# Patient Record
Sex: Female | Born: 1960 | Race: White | Hispanic: No | Marital: Married | Smoking: Current every day smoker
Health system: Southern US, Community
[De-identification: ages and names within clinical notes are randomized; demographics above are authoritative.]

## PROBLEM LIST (undated history)

## (undated) DIAGNOSIS — E119 Type 2 diabetes mellitus without complications: Secondary | ICD-10-CM

## (undated) DIAGNOSIS — J449 Chronic obstructive pulmonary disease, unspecified: Secondary | ICD-10-CM

## (undated) DIAGNOSIS — I639 Cerebral infarction, unspecified: Secondary | ICD-10-CM

## (undated) DIAGNOSIS — R51 Headache: Secondary | ICD-10-CM

## (undated) DIAGNOSIS — F17200 Nicotine dependence, unspecified, uncomplicated: Secondary | ICD-10-CM

## (undated) DIAGNOSIS — F419 Anxiety disorder, unspecified: Secondary | ICD-10-CM

## (undated) DIAGNOSIS — E7800 Pure hypercholesterolemia, unspecified: Secondary | ICD-10-CM

## (undated) DIAGNOSIS — I10 Essential (primary) hypertension: Secondary | ICD-10-CM

## (undated) DIAGNOSIS — F32A Depression, unspecified: Secondary | ICD-10-CM

## (undated) HISTORY — PX: BREAST LUMPECTOMY: SHX2

---

## 2003-03-21 ENCOUNTER — Emergency Department (HOSPITAL_COMMUNITY): Admission: EM | Admit: 2003-03-21 | Discharge: 2003-03-22 | Payer: Self-pay | Admitting: Emergency Medicine

## 2009-11-16 ENCOUNTER — Emergency Department (HOSPITAL_COMMUNITY): Admission: EM | Admit: 2009-11-16 | Discharge: 2009-11-16 | Payer: Self-pay | Admitting: Emergency Medicine

## 2012-05-04 ENCOUNTER — Encounter (HOSPITAL_COMMUNITY): Payer: Self-pay | Admitting: *Deleted

## 2012-05-04 ENCOUNTER — Emergency Department (HOSPITAL_COMMUNITY)
Admission: EM | Admit: 2012-05-04 | Discharge: 2012-05-04 | Disposition: A | Discharge status: Home / Self Care | Payer: Worker's Compensation | Attending: Emergency Medicine | Admitting: Emergency Medicine

## 2012-05-04 DIAGNOSIS — I10 Essential (primary) hypertension: Secondary | ICD-10-CM | POA: Insufficient documentation

## 2012-05-04 DIAGNOSIS — Z79899 Other long term (current) drug therapy: Secondary | ICD-10-CM | POA: Insufficient documentation

## 2012-05-04 DIAGNOSIS — S61209A Unspecified open wound of unspecified finger without damage to nail, initial encounter: Secondary | ICD-10-CM | POA: Insufficient documentation

## 2012-05-04 DIAGNOSIS — E119 Type 2 diabetes mellitus without complications: Secondary | ICD-10-CM | POA: Insufficient documentation

## 2012-05-04 DIAGNOSIS — Y9289 Other specified places as the place of occurrence of the external cause: Secondary | ICD-10-CM | POA: Insufficient documentation

## 2012-05-04 DIAGNOSIS — Y990 Civilian activity done for income or pay: Secondary | ICD-10-CM | POA: Insufficient documentation

## 2012-05-04 DIAGNOSIS — Y9389 Activity, other specified: Secondary | ICD-10-CM | POA: Insufficient documentation

## 2012-05-04 DIAGNOSIS — F17200 Nicotine dependence, unspecified, uncomplicated: Secondary | ICD-10-CM | POA: Insufficient documentation

## 2012-05-04 DIAGNOSIS — S61219A Laceration without foreign body of unspecified finger without damage to nail, initial encounter: Secondary | ICD-10-CM

## 2012-05-04 DIAGNOSIS — W292XXA Contact with other powered household machinery, initial encounter: Secondary | ICD-10-CM | POA: Insufficient documentation

## 2012-05-04 HISTORY — DX: Essential (primary) hypertension: I10

## 2012-05-04 MED ORDER — CEPHALEXIN 500 MG PO CAPS: 500.0000 mg | ORAL_CAPSULE | Freq: Four times a day (QID) | ORAL | Status: DC

## 2012-05-04 MED ORDER — HYDROCODONE-ACETAMINOPHEN 5-325 MG PO TABS: 1.0000 | ORAL_TABLET | ORAL | Status: DC | PRN

## 2012-05-04 MED ORDER — TETANUS-DIPHTH-ACELL PERTUSSIS 5-2.5-18.5 LF-MCG/0.5 IM SUSP
0.5000 mL | Freq: Once | INTRAMUSCULAR | Status: AC
  Administered 2012-05-04: 0.5 mL via INTRAMUSCULAR
  Filled 2012-05-04: qty 0.5

## 2012-05-04 MED ORDER — LIDOCAINE HCL (PF) 2 % IJ SOLN
2.0000 mL | Freq: Once | INTRAMUSCULAR | Status: DC
  Filled 2012-05-04: qty 10

## 2012-05-04 MED ORDER — CEPHALEXIN 500 MG PO CAPS
500.0000 mg | ORAL_CAPSULE | Freq: Once | ORAL | Status: AC
  Administered 2012-05-04: 500 mg via ORAL
  Filled 2012-05-04: qty 1

## 2012-05-04 MED ORDER — HYDROCODONE-ACETAMINOPHEN 5-325 MG PO TABS
1.0000 | ORAL_TABLET | Freq: Once | ORAL | Status: AC
  Administered 2012-05-04: 1 via ORAL
  Filled 2012-05-04: qty 1

## 2012-05-04 NOTE — ED Notes (Signed)
drsg applied to finger. nad noted prior to dc. Dc instructions reviewed and 2 scripts given. Ambulated out without difficulty.

## 2012-05-04 NOTE — ED Notes (Signed)
Pt sliced left pointer finger on meat slicer. Bleeding controlled.

## 2012-05-04 NOTE — ED Provider Notes (Signed)
History     CSN: [id number]  Arrival date & time 05/04/12  1842   First MD Initiated Contact with Patient 05/04/12 1858      Chief Complaint  Patient presents with  . Laceration    (Consider location/radiation/quality/duration/timing/severity/associated sxs/prior treatment) Patient is a 52 y.o. female presenting with skin laceration. The history is provided by the patient and the spouse.  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 Pain details:    Quality:  Throbbing   Severity:  Moderate   Progression:  Unchanged Relieved by:  Certain positions Worsened by:  Pressure Tetanus status:  Unknown   Past Medical History  Diagnosis Date  . Diabetes mellitus without complication   . Hypertension     History reviewed. No pertinent past surgical history.  History reviewed. No pertinent family history.  History  Substance Use Topics  . Smoking status: Current Every Day Smoker  . Smokeless tobacco: Not on file  . Alcohol Use: Yes     Comment: occasionally    OB History   Grav Para Term Preterm Abortions TAB SAB Ect Mult Living                  Review of Systems  Constitutional: Negative for fever and chills.  HENT: Negative.   Respiratory: Negative.   Cardiovascular: Negative.   Gastrointestinal: Negative for nausea.  Skin: Positive for wound.  Neurological: Negative for numbness.    Allergies  Review of patient's allergies indicates no known allergies.  Home Medications   Current Outpatient Rx  Name  Route  Sig  Dispense  Refill  . lisinopril-hydrochlorothiazide (PRINZIDE,ZESTORETIC) 20-12.5 MG per tablet   Oral   Take 1 tablet by mouth daily.         . metFORMIN (GLUCOPHAGE) 1000 MG tablet   Oral   Take 2,000 mg by mouth 2 (two) times daily with a meal.         . cephALEXin  (KEFLEX) 500 MG capsule   Oral   Take 1 capsule (500 mg total) by mouth 4 (four) times daily.   28 capsule   0   . HYDROcodone-acetaminophen (NORCO/VICODIN) 5-325 MG per tablet   Oral   Take 1 tablet by mouth every 4 (four) hours as needed for pain.   15 tablet   0     BP 145/80  Pulse 101  Temp(Src) 98.4 F (36.9 C) (Oral)  Resp 20  Ht 5\' 5"  (1.651 m)  Wt 246 lb (111.585 kg)  BMI 40.94 kg/m2  SpO2 100%  Physical Exam  Constitutional: She is oriented to person, place, and time. She appears well-developed and well-nourished.  HENT:  Head: Normocephalic.  Cardiovascular: Normal rate.   Pulmonary/Chest: Effort normal.  Musculoskeletal: She exhibits no tenderness.  Neurological: She is alert and oriented to person, place, and time. No sensory deficit.  Skin: Laceration noted.  Flap laceration left index finger tuft,  Hemostatic.    ED Course  Procedures (including critical care time)   LACERATION REPAIR Performed by: Burgess Amor Authorized by: Burgess Amor Consent: Verbal consent obtained. Risks and benefits: risks, benefits and alternatives were discussed Consent given by: patient Patient identity confirmed: provided demographic data Prepped and Draped in normal sterile fashion Wound explored  Laceration Location: left index finger  Laceration Length: 2 cm  No Foreign  Bodies seen or palpated  Anesthesia: digital block Local anesthetic: lidocaine 2% without epinephrine  Anesthetic total: 4 ml  Irrigation method: syringe Amount of cleaning: copious with saline and syringe  Skin closure: ethilon 4-0  Number of sutures: 5  Technique: simple interrupted   Patient tolerance: Patient tolerated the procedure well with no immediate complications.   Labs Reviewed - No data to display No results found.   1. Laceration of finger of left hand, initial encounter       MDM  Simple suture laceration  But borderline old injury in a diabetic patient.  Her  delay in presentation was due to waiting at another ed for 4 hours before coming here.  She was given keflex prescription.  Tetanus updated as well.  Few hydrocodone.  Encouraged ice,  Elevation once numbness wears off.  Recheck for any sign of infection.  Suture removal in 10 days.  Dressing applied.        Burgess Amor, PA-C 05/04/12 2026

## 2012-05-07 NOTE — ED Provider Notes (Signed)
Medical screening examination/treatment/procedure(s) were performed by non-physician practitioner and as supervising physician I was immediately available for consultation/collaboration.  Jacy Howat J. Katharyn Schauer, MD 05/07/12 1205 

## 2013-02-14 ENCOUNTER — Emergency Department (HOSPITAL_COMMUNITY)
Admission: EM | Admit: 2013-02-14 | Discharge: 2013-02-14 | Disposition: A | Discharge status: Home / Self Care | Payer: Self-pay | Attending: Emergency Medicine | Admitting: Emergency Medicine

## 2013-02-14 ENCOUNTER — Encounter (HOSPITAL_COMMUNITY): Payer: Self-pay | Admitting: Emergency Medicine

## 2013-02-14 DIAGNOSIS — I10 Essential (primary) hypertension: Secondary | ICD-10-CM | POA: Insufficient documentation

## 2013-02-14 DIAGNOSIS — J029 Acute pharyngitis, unspecified: Secondary | ICD-10-CM | POA: Insufficient documentation

## 2013-02-14 DIAGNOSIS — F17200 Nicotine dependence, unspecified, uncomplicated: Secondary | ICD-10-CM | POA: Insufficient documentation

## 2013-02-14 DIAGNOSIS — Z79899 Other long term (current) drug therapy: Secondary | ICD-10-CM | POA: Insufficient documentation

## 2013-02-14 DIAGNOSIS — E119 Type 2 diabetes mellitus without complications: Secondary | ICD-10-CM | POA: Insufficient documentation

## 2013-02-14 DIAGNOSIS — R6889 Other general symptoms and signs: Secondary | ICD-10-CM | POA: Insufficient documentation

## 2013-02-14 DIAGNOSIS — Z792 Long term (current) use of antibiotics: Secondary | ICD-10-CM | POA: Insufficient documentation

## 2013-02-14 DIAGNOSIS — J329 Chronic sinusitis, unspecified: Secondary | ICD-10-CM | POA: Insufficient documentation

## 2013-02-14 MED ORDER — AMOXICILLIN-POT CLAVULANATE 875-125 MG PO TABS: 1.0000 | ORAL_TABLET | Freq: Two times a day (BID) | ORAL | Status: DC

## 2013-02-14 NOTE — ED Notes (Signed)
Sinus congestion,  sorethroat and cough since Monday

## 2013-02-14 NOTE — Discharge Instructions (Signed)

## 2013-02-16 NOTE — ED Provider Notes (Signed)
CSN: [id number]     Arrival date & time 02/14/13  1024 History   First MD Initiated Contact with Patient 02/14/13 1141     Chief Complaint  Patient presents with  . Nasal Congestion     (Consider location/radiation/quality/duration/timing/severity/associated sxs/prior Treatment) Patient is a 53 y.o. female presenting with URI. The history is provided by the patient.  URI Presenting symptoms: congestion, facial pain and sore throat   Presenting symptoms: no cough, no ear pain, no fever and no rhinorrhea   Severity:  Moderate Onset quality:  Gradual Duration:  1 week Timing:  Constant Progression:  Worsening Chronicity:  New Relieved by:  Nothing Worsened by:  Nothing tried Ineffective treatments:  OTC medications Associated symptoms: sinus pain and sneezing   Associated symptoms: no arthralgias, no headaches, no myalgias, no neck pain, no swollen glands and no wheezing   Associated symptoms comment:  Nasal congestion Risk factors: diabetes mellitus   Risk factors: no immunosuppression and no sick contacts     Past Medical History  Diagnosis Date  . Diabetes mellitus without complication   . Hypertension    History reviewed. No pertinent past surgical history. History reviewed. No pertinent family history. History  Substance Use Topics  . Smoking status: Current Every Day Smoker  . Smokeless tobacco: Not on file  . Alcohol Use: Yes     Comment: occasionally   OB History   Grav Para Term Preterm Abortions TAB SAB Ect Mult Living                 Review of Systems  Constitutional: Negative for fever, chills, activity change and appetite change.  HENT: Positive for congestion, sinus pressure, sneezing and sore throat. Negative for ear pain, facial swelling, rhinorrhea and trouble swallowing.   Eyes: Negative for visual disturbance.  Respiratory: Negative for cough, chest tightness, shortness of breath, wheezing and stridor.   Gastrointestinal: Negative for nausea,  vomiting and abdominal pain.  Musculoskeletal: Negative for arthralgias, myalgias, neck pain and neck stiffness.  Skin: Negative.   Neurological: Negative for dizziness, weakness, numbness and headaches.  Hematological: Negative for adenopathy.  Psychiatric/Behavioral: Negative for confusion.  All other systems reviewed and are negative.      Allergies  Review of patient's allergies indicates no known allergies.  Home Medications   Current Outpatient Rx  Name  Route  Sig  Dispense  Refill  . guaiFENesin (ROBITUSSIN) 100 MG/5ML SOLN   Oral   Take 5 mLs by mouth every 4 (four) hours as needed for cough or to loosen phlegm.         Marland Kitchen lisinopril-hydrochlorothiazide (PRINZIDE,ZESTORETIC) 20-12.5 MG per tablet   Oral   Take 1 tablet by mouth daily.         . metFORMIN (GLUCOPHAGE) 1000 MG tablet   Oral   Take 2,000 mg by mouth 2 (two) times daily with a meal.         . amoxicillin-clavulanate (AUGMENTIN) 875-125 MG per tablet   Oral   Take 1 tablet by mouth 2 (two) times daily. For 10 days   20 tablet   0    BP 187/104  Pulse 65  Temp(Src) 98.4 F (36.9 C) (Oral)  Resp 20  SpO2 99% Physical Exam  Nursing note and vitals reviewed. Constitutional: She is oriented to person, place, and time. She appears well-developed and well-nourished. No distress.  HENT:  Head: Normocephalic and atraumatic.  Right Ear: Tympanic membrane and ear canal normal.  Left Ear: Tympanic membrane  and ear canal normal.  Nose: Mucosal edema and rhinorrhea present. Right sinus exhibits maxillary sinus tenderness. Left sinus exhibits maxillary sinus tenderness.  Mouth/Throat: Uvula is midline, oropharynx is clear and moist and mucous membranes are normal. No trismus in the jaw. No uvula swelling. No oropharyngeal exudate, posterior oropharyngeal edema, posterior oropharyngeal erythema or tonsillar abscesses.  Eyes: Conjunctivae are normal.  Neck: Normal range of motion and phonation normal.  Neck supple. No Brudzinski's sign and no Kernig's sign noted.  Cardiovascular: Normal rate, regular rhythm, normal heart sounds and intact distal pulses.   No murmur heard. Pulmonary/Chest: Effort normal and breath sounds normal. No respiratory distress. She has no wheezes. She has no rales.  Abdominal: Soft. She exhibits no distension. There is no tenderness. There is no rebound and no guarding.  Musculoskeletal: Normal range of motion. She exhibits no edema.  Lymphadenopathy:    She has no cervical adenopathy.  Neurological: She is alert and oriented to person, place, and time. She exhibits normal muscle tone. Coordination normal.  Skin: Skin is warm and dry.    ED Course  Procedures (including critical care time) Labs Review Labs Reviewed - No data to display Imaging Review No results found.  EKG Interpretation   None       MDM   Final diagnoses:  Sinusitis    Patient with one week hx of sinus pressure, nasal congestion, and facial pain.  VSS.  She is otherwise well appearing.  No meningeal signs.  She agrees to ibuprofen, fluids and zithromax.  She appears stable for d/c.      Brytani Voth L. Vanessa , PA-C 02/16/13 2059

## 2013-02-17 NOTE — ED Provider Notes (Signed)
Medical screening examination/treatment/procedure(s) were performed by non-physician practitioner and as supervising physician I was immediately available for consultation/collaboration.  EKG Interpretation   None         Alfonzo Feller, DO 02/17/13 1138

## 2013-07-07 ENCOUNTER — Emergency Department (HOSPITAL_COMMUNITY)
Admission: EM | Admit: 2013-07-07 | Discharge: 2013-07-07 | Disposition: A | Discharge status: Home / Self Care | Payer: 59 | Attending: Emergency Medicine | Admitting: Emergency Medicine

## 2013-07-07 ENCOUNTER — Emergency Department (HOSPITAL_COMMUNITY): Payer: 59

## 2013-07-07 ENCOUNTER — Encounter (HOSPITAL_COMMUNITY): Payer: Self-pay | Admitting: Emergency Medicine

## 2013-07-07 DIAGNOSIS — E119 Type 2 diabetes mellitus without complications: Secondary | ICD-10-CM | POA: Insufficient documentation

## 2013-07-07 DIAGNOSIS — F17200 Nicotine dependence, unspecified, uncomplicated: Secondary | ICD-10-CM | POA: Insufficient documentation

## 2013-07-07 DIAGNOSIS — Z79899 Other long term (current) drug therapy: Secondary | ICD-10-CM | POA: Insufficient documentation

## 2013-07-07 DIAGNOSIS — I10 Essential (primary) hypertension: Secondary | ICD-10-CM | POA: Insufficient documentation

## 2013-07-07 DIAGNOSIS — W010XXA Fall on same level from slipping, tripping and stumbling without subsequent striking against object, initial encounter: Secondary | ICD-10-CM | POA: Insufficient documentation

## 2013-07-07 DIAGNOSIS — Y990 Civilian activity done for income or pay: Secondary | ICD-10-CM | POA: Insufficient documentation

## 2013-07-07 DIAGNOSIS — S8002XA Contusion of left knee, initial encounter: Secondary | ICD-10-CM

## 2013-07-07 DIAGNOSIS — Y9289 Other specified places as the place of occurrence of the external cause: Secondary | ICD-10-CM | POA: Insufficient documentation

## 2013-07-07 DIAGNOSIS — S8000XA Contusion of unspecified knee, initial encounter: Secondary | ICD-10-CM | POA: Insufficient documentation

## 2013-07-07 DIAGNOSIS — Y9389 Activity, other specified: Secondary | ICD-10-CM | POA: Insufficient documentation

## 2013-07-07 MED ORDER — OXYCODONE-ACETAMINOPHEN 5-325 MG PO TABS: 1.0000 | ORAL_TABLET | ORAL | Status: DC | PRN

## 2013-07-07 NOTE — Discharge Instructions (Signed)
Contusion °A contusion is a deep bruise. Contusions are the result of an injury that caused bleeding under the skin. The contusion may turn blue, purple, or yellow. Minor injuries will give you a painless contusion, but more severe contusions may stay painful and swollen for a few weeks.  °CAUSES  °A contusion is usually caused by a blow, trauma, or direct force to an area of the body. °SYMPTOMS  °· Swelling and redness of the injured area. °· Bruising of the injured area. °· Tenderness and soreness of the injured area. °· Pain. °DIAGNOSIS  °The diagnosis can be made by taking a history and physical exam. An X-ray, CT scan, or MRI may be needed to determine if there were any associated injuries, such as fractures. °TREATMENT  °Specific treatment will depend on what area of the body was injured. In general, the best treatment for a contusion is resting, icing, elevating, and applying cold compresses to the injured area. Over-the-counter medicines may also be recommended for pain control. Ask your caregiver what the best treatment is for your contusion. °HOME CARE INSTRUCTIONS  °· Put ice on the injured area. °¨ Put ice in a plastic bag. °¨ Place a towel between your skin and the bag. °¨ Leave the ice on for 15-20 minutes, 3-4 times a day, or as directed by your health care provider. °· Only take over-the-counter or prescription medicines for pain, discomfort, or fever as directed by your caregiver. Your caregiver may recommend avoiding anti-inflammatory medicines (aspirin, ibuprofen, and naproxen) for 48 hours because these medicines may increase bruising. °· Rest the injured area. °· If possible, elevate the injured area to reduce swelling. °SEEK IMMEDIATE MEDICAL CARE IF:  °· You have increased bruising or swelling. °· You have pain that is getting worse. °· Your swelling or pain is not relieved with medicines. °MAKE SURE YOU:  °· Understand these instructions. °· Will watch your condition. °· Will get help right  away if you are not doing well or get worse. °Document Released: 10/04/2004 Document Revised: 12/30/2012 Document Reviewed: 10/30/2010 °[employer]® Patient Information ©2015 [employer]. This information is not intended to replace advice given to you by your health care provider. Make sure you discuss any questions you have with your health care provider. ° °

## 2013-07-07 NOTE — ED Provider Notes (Signed)
CSN: [id number]     Arrival date & time 07/07/13  0701 History  This chart was scribed for Sharyon Cable, MD by Cathie Hoops, ED Scribe. This patient was seen in room APA03/APA03 and the patient's care was started at 7:15AM.   Chief Complaint  Patient presents with  . Fall    Patient is a 53 y.o. female presenting with knee pain. The history is provided by the patient. No language interpreter was used.  Knee Pain Location:  Knee Time since incident:  1 day Injury: yes   Mechanism of injury: fall   Fall:    Fall occurred:  [employer] of impact:  Knees (left knee) Knee location:  L knee Pain details:    Quality:  Unable to specify   Radiates to: left hip.   Severity:  Moderate   Onset quality:  Sudden Associated symptoms: no back pain and no neck pain    HPI Comments: Shannon Price is a 53 y.o. female who presents to the Emergency Department complaining of a fall yesterday onto her left patella. She states that her right leg slipped from under her and she landed on her left knee, with the knee being flexed. Pt is experiencing constant, moderate and gradually worsening pain in her left patella. Pt had no other injuries to the back, head, ankle or abdomen. Pt states that her pain is worsened when she ambulates or bends her left knee. Pt notes she hears an audible popping noise in the knee, that started this morning. There is also an abrasion to the left knee. Pt has attempted to relieve pain with Advil without relief. She last took Advil at 5:15AM this morning. She states that her Tetanus vaccinations are UTD. She denies being on any anticoagulation medications.    Past Medical History  Diagnosis Date  . Diabetes mellitus without complication   . Hypertension    History reviewed. No pertinent past surgical history. No family history on file. History  Substance Use Topics  . Smoking status: Current Every Day Smoker -- 2.00 packs/day    Types: Cigarettes  . Smokeless  tobacco: Not on file  . Alcohol Use: Yes     Comment: occasionally   OB History   Grav Para Term Preterm Abortions TAB SAB Ect Mult Living                 Review of Systems  Cardiovascular: Negative for chest pain.  Gastrointestinal: Negative for abdominal pain.  Musculoskeletal: Positive for arthralgias (left knee). Negative for back pain and neck pain.  Skin: Positive for wound (abrasion left knee).  Neurological: Negative for syncope and headaches.  All other systems reviewed and are negative.   Allergies  Review of patient's allergies indicates no known allergies.  Home Medications   Prior to Admission medications   Medication Sig Start Date End Date Taking? Authorizing Provider  lisinopril-hydrochlorothiazide (PRINZIDE,ZESTORETIC) 20-12.5 MG per tablet Take 1 tablet by mouth daily.    Historical Provider, MD  metFORMIN (GLUCOPHAGE) 1000 MG tablet Take 2,000 mg by mouth 2 (two) times daily with a meal.    Historical Provider, MD  oxyCODONE-acetaminophen (PERCOCET/ROXICET) 5-325 MG per tablet Take 1 tablet by mouth every 4 (four) hours as needed for severe pain. 07/07/13   Sharyon Cable, MD   Triage Vitals: BP 190/95  Pulse 107  Temp(Src) 98.3 F (36.8 C) (Oral)  Resp 16  Ht 5\' 5"  (1.651 m)  Wt 246 lb (111.585 kg)  BMI 40.94 kg/m2  SpO2 100%  Physical Exam  Nursing note and vitals reviewed. CONSTITUTIONAL: Well developed/well nourished HEAD: Normocephalic/atraumatic EYES: EOMI/PERRL ENMT: Mucous membranes moist NECK: supple no meningeal signs SPINE:entire spine nontender CV: S1/S2 noted, no murmurs/rubs/gallops noted LUNGS: Lungs are clear to auscultation bilaterally, no apparent distress ABDOMEN: soft, nontender, no rebound or guarding GU:no cva tenderness NEURO: Pt is awake/alert, moves all extremitiesx4 EXTREMITIES: pulses normal, full ROM, tenderness and swelling to left patella, no other extremity tenderness SKIN: warm, color normal PSYCH: no  abnormalities of mood noted   ED Course  Procedures   DIAGNOSTIC STUDIES: Oxygen Saturation is 100% on room air, normal by my interpretation.    COORDINATION OF CARE: 7:19 AM- Will order x-ray of left patella. Pt advised of plan for treatment. Pt verbalizes understanding and agreement with plan.  Imaging Review Dg Knee Complete 4 Views Left  07/07/2013   CLINICAL DATA:  Anterior knee pain status post fall  EXAM: LEFT KNEE - COMPLETE 4+ VIEW  COMPARISON:  None.  FINDINGS: There is soft tissue swelling in the prepatellar region. There is no definite joint effusion. The patella is intact. The distal femur, proximal tibia, and proximal fibula are intact. The joint spaces are preserved.  IMPRESSION: There is soft tissue swelling anteriorly over the patella. There is no acute fracture nor dislocation.   Electronically Signed   By: David  Martinique   On: 07/07/2013 07:51    MDM   Final diagnoses:  Contusion of left knee, initial encounter    Nursing notes including past medical history and social history reviewed and considered in documentation .xrays reviewed and considered    I personally performed the services described in this documentation, which was scribed in my presence. The recorded information has been reviewed and is accurate.   Sharyon Cable, MD 07/07/13 1038

## 2013-07-07 NOTE — ED Notes (Signed)
Pt states she was working yesterday, around 1600 and 1700 pt reports slipping in "puddle of grease" at work and fell landing on her left knee. Closed wound oon left knee. Difficulty bending knee this am. Denies all other symptoms.

## 2013-07-08 DIAGNOSIS — I639 Cerebral infarction, unspecified: Secondary | ICD-10-CM

## 2013-07-08 HISTORY — DX: Cerebral infarction, unspecified: I63.9

## 2013-07-21 ENCOUNTER — Emergency Department (HOSPITAL_COMMUNITY): Payer: 59

## 2013-07-21 ENCOUNTER — Inpatient Hospital Stay (HOSPITAL_COMMUNITY)
Admission: EM | Admit: 2013-07-21 | Discharge: 2013-07-23 | DRG: 065 | Disposition: A | Discharge status: Home / Self Care | Payer: 59 | Attending: Internal Medicine | Admitting: Internal Medicine

## 2013-07-21 ENCOUNTER — Encounter (HOSPITAL_COMMUNITY): Payer: Self-pay | Admitting: Emergency Medicine

## 2013-07-21 DIAGNOSIS — I639 Cerebral infarction, unspecified: Secondary | ICD-10-CM

## 2013-07-21 DIAGNOSIS — I6350 Cerebral infarction due to unspecified occlusion or stenosis of unspecified cerebral artery: Principal | ICD-10-CM | POA: Diagnosis present

## 2013-07-21 DIAGNOSIS — Z6841 Body Mass Index (BMI) 40.0 and over, adult: Secondary | ICD-10-CM

## 2013-07-21 DIAGNOSIS — E1165 Type 2 diabetes mellitus with hyperglycemia: Secondary | ICD-10-CM

## 2013-07-21 DIAGNOSIS — F17200 Nicotine dependence, unspecified, uncomplicated: Secondary | ICD-10-CM | POA: Diagnosis present

## 2013-07-21 DIAGNOSIS — R4781 Slurred speech: Secondary | ICD-10-CM

## 2013-07-21 DIAGNOSIS — R29810 Facial weakness: Secondary | ICD-10-CM | POA: Diagnosis present

## 2013-07-21 DIAGNOSIS — IMO0001 Reserved for inherently not codable concepts without codable children: Secondary | ICD-10-CM

## 2013-07-21 DIAGNOSIS — E669 Obesity, unspecified: Secondary | ICD-10-CM | POA: Diagnosis present

## 2013-07-21 DIAGNOSIS — F1020 Alcohol dependence, uncomplicated: Secondary | ICD-10-CM

## 2013-07-21 DIAGNOSIS — Z23 Encounter for immunization: Secondary | ICD-10-CM

## 2013-07-21 DIAGNOSIS — E785 Hyperlipidemia, unspecified: Secondary | ICD-10-CM

## 2013-07-21 DIAGNOSIS — E119 Type 2 diabetes mellitus without complications: Secondary | ICD-10-CM | POA: Diagnosis present

## 2013-07-21 DIAGNOSIS — I6381 Other cerebral infarction due to occlusion or stenosis of small artery: Secondary | ICD-10-CM | POA: Diagnosis present

## 2013-07-21 DIAGNOSIS — I10 Essential (primary) hypertension: Secondary | ICD-10-CM

## 2013-07-21 DIAGNOSIS — R4789 Other speech disturbances: Secondary | ICD-10-CM | POA: Diagnosis present

## 2013-07-21 DIAGNOSIS — Z720 Tobacco use: Secondary | ICD-10-CM

## 2013-07-21 DIAGNOSIS — Z79899 Other long term (current) drug therapy: Secondary | ICD-10-CM

## 2013-07-21 HISTORY — DX: Pure hypercholesterolemia, unspecified: E78.00

## 2013-07-21 HISTORY — DX: Cerebral infarction, unspecified: I63.9

## 2013-07-21 HISTORY — DX: Type 2 diabetes mellitus without complications: E11.9

## 2013-07-21 HISTORY — DX: Headache: R51

## 2013-07-21 LAB — RAPID URINE DRUG SCREEN, HOSP PERFORMED
AMPHETAMINES: NOT DETECTED
Barbiturates: NOT DETECTED
Benzodiazepines: NOT DETECTED
COCAINE: NOT DETECTED
Opiates: NOT DETECTED
TETRAHYDROCANNABINOL: NOT DETECTED

## 2013-07-21 LAB — DIFFERENTIAL
BASOS ABS: 0 10*3/uL (ref 0.0–0.1)
Basophils Relative: 1 % (ref 0–1)
Eosinophils Absolute: 0.1 10*3/uL (ref 0.0–0.7)
Eosinophils Relative: 2 % (ref 0–5)
LYMPHS ABS: 1.7 10*3/uL (ref 0.7–4.0)
LYMPHS PCT: 31 % (ref 12–46)
Monocytes Absolute: 0.3 10*3/uL (ref 0.1–1.0)
Monocytes Relative: 5 % (ref 3–12)
NEUTROS ABS: 3.4 10*3/uL (ref 1.7–7.7)
Neutrophils Relative %: 61 % (ref 43–77)

## 2013-07-21 LAB — COMPREHENSIVE METABOLIC PANEL
ALK PHOS: 101 U/L (ref 39–117)
ALT: 37 U/L — AB (ref 0–35)
AST: 23 U/L (ref 0–37)
Albumin: 3.5 g/dL (ref 3.5–5.2)
Anion gap: 17 — ABNORMAL HIGH (ref 5–15)
BILIRUBIN TOTAL: 0.2 mg/dL — AB (ref 0.3–1.2)
BUN: 15 mg/dL (ref 6–23)
CHLORIDE: 98 meq/L (ref 96–112)
CO2: 20 meq/L (ref 19–32)
Calcium: 9.4 mg/dL (ref 8.4–10.5)
Creatinine, Ser: 1.02 mg/dL (ref 0.50–1.10)
GFR calc Af Amer: 73 mL/min — ABNORMAL LOW (ref >90)
GFR, EST NON AFRICAN AMERICAN: 63 mL/min — AB (ref >90)
GLUCOSE: 193 mg/dL — AB (ref 70–99)
Potassium: 4.2 mEq/L (ref 3.7–5.3)
SODIUM: 135 meq/L — AB (ref 137–147)
Total Protein: 7 g/dL (ref 6.0–8.3)

## 2013-07-21 LAB — CBC
HCT: 41.6 % (ref 36.0–46.0)
HEMATOCRIT: 44.6 % (ref 36.0–46.0)
Hemoglobin: 15 g/dL (ref 12.0–15.0)
Hemoglobin: 14.2 g/dL (ref 12.0–15.0)
MCH: 31.8 pg (ref 26.0–34.0)
MCH: 31.8 pg (ref 26.0–34.0)
MCHC: 33.6 g/dL (ref 30.0–36.0)
MCHC: 34.1 g/dL (ref 30.0–36.0)
MCV: 94.5 fL (ref 78.0–100.0)
MCV: 93.3 fL (ref 78.0–100.0)
PLATELETS: 192 10*3/uL (ref 150–400)
PLATELETS: 208 10*3/uL (ref 150–400)
RBC: 4.72 MIL/uL (ref 3.87–5.11)
RBC: 4.46 MIL/uL (ref 3.87–5.11)
RDW: 12.7 % (ref 11.5–15.5)
RDW: 12.7 % (ref 11.5–15.5)
WBC: 5.6 10*3/uL (ref 4.0–10.5)
WBC: 6.7 10*3/uL (ref 4.0–10.5)

## 2013-07-21 LAB — PROTIME-INR
INR: 0.97 (ref 0.00–1.49)
PROTHROMBIN TIME: 12.9 s (ref 11.6–15.2)

## 2013-07-21 LAB — APTT: APTT: 29 s (ref 24–37)

## 2013-07-21 LAB — CREATININE, SERUM: Creatinine, Ser: 0.71 mg/dL (ref 0.50–1.10)

## 2013-07-21 LAB — I-STAT TROPONIN, ED: Troponin i, poc: 0 ng/mL (ref 0.00–0.08)

## 2013-07-21 LAB — GLUCOSE, CAPILLARY
Glucose-Capillary: 254 mg/dL — ABNORMAL HIGH (ref 70–99)
Glucose-Capillary: 229 mg/dL — ABNORMAL HIGH (ref 70–99)

## 2013-07-21 LAB — ETHANOL: Alcohol, Ethyl (B): <11 mg/dL (ref 0–11)

## 2013-07-21 MED ORDER — SODIUM CHLORIDE 0.9 % IJ SOLN
3.0000 mL | Freq: Two times a day (BID) | INTRAMUSCULAR | Status: DC
  Administered 2013-07-21 – 2013-07-23 (×4): 3 mL via INTRAVENOUS

## 2013-07-21 MED ORDER — VITAMIN B-1 100 MG PO TABS
100.0000 mg | ORAL_TABLET | Freq: Every day | ORAL | Status: DC
  Administered 2013-07-21 – 2013-07-23 (×3): 100 mg via ORAL
  Filled 2013-07-21 (×3): qty 1

## 2013-07-21 MED ORDER — LORAZEPAM 2 MG/ML IJ SOLN: 0.0000 mg | Freq: Four times a day (QID) | INTRAMUSCULAR | Status: DC

## 2013-07-21 MED ORDER — INSULIN ASPART 100 UNIT/ML ~~LOC~~ SOLN
0.0000 [IU] | Freq: Every day | SUBCUTANEOUS | Status: DC
  Administered 2013-07-21: 2 [IU] via SUBCUTANEOUS

## 2013-07-21 MED ORDER — THIAMINE HCL 100 MG/ML IJ SOLN: 100.0000 mg | Freq: Every day | INTRAMUSCULAR | Status: DC

## 2013-07-21 MED ORDER — ONDANSETRON HCL 4 MG/2ML IJ SOLN: 4.0000 mg | Freq: Four times a day (QID) | INTRAMUSCULAR | Status: DC | PRN

## 2013-07-21 MED ORDER — ASPIRIN 325 MG PO TABS: 325.0000 mg | ORAL_TABLET | Freq: Every day | ORAL | Status: DC

## 2013-07-21 MED ORDER — INSULIN ASPART 100 UNIT/ML ~~LOC~~ SOLN
0.0000 [IU] | Freq: Three times a day (TID) | SUBCUTANEOUS | Status: DC
  Administered 2013-07-22: 3 [IU] via SUBCUTANEOUS
  Administered 2013-07-22: 8 [IU] via SUBCUTANEOUS
  Administered 2013-07-22: 5 [IU] via SUBCUTANEOUS
  Administered 2013-07-23 (×2): 3 [IU] via SUBCUTANEOUS

## 2013-07-21 MED ORDER — ASPIRIN 325 MG PO TABS
325.0000 mg | ORAL_TABLET | Freq: Every day | ORAL | Status: DC
  Administered 2013-07-21 – 2013-07-23 (×3): 325 mg via ORAL
  Filled 2013-07-21 (×3): qty 1

## 2013-07-21 MED ORDER — ONDANSETRON HCL 4 MG PO TABS: 4.0000 mg | ORAL_TABLET | Freq: Four times a day (QID) | ORAL | Status: DC | PRN

## 2013-07-21 MED ORDER — PNEUMOCOCCAL VAC POLYVALENT 25 MCG/0.5ML IJ INJ
0.5000 mL | INJECTION | INTRAMUSCULAR | Status: AC
  Administered 2013-07-23: 0.5 mL via INTRAMUSCULAR
  Filled 2013-07-21: qty 0.5

## 2013-07-21 MED ORDER — ACETAMINOPHEN 325 MG PO TABS: 650.0000 mg | ORAL_TABLET | Freq: Four times a day (QID) | ORAL | Status: DC | PRN

## 2013-07-21 MED ORDER — LORAZEPAM 2 MG/ML IJ SOLN: 0.0000 mg | Freq: Two times a day (BID) | INTRAMUSCULAR | Status: DC

## 2013-07-21 MED ORDER — STROKE: EARLY STAGES OF RECOVERY BOOK
Freq: Once | Status: AC
  Administered 2013-07-21: 18:00:00
  Filled 2013-07-21 (×2): qty 1

## 2013-07-21 MED ORDER — ADULT MULTIVITAMIN W/MINERALS CH: 1.0000 | ORAL_TABLET | Freq: Every day | ORAL | Status: DC

## 2013-07-21 MED ORDER — MORPHINE SULFATE 2 MG/ML IJ SOLN: 2.0000 mg | INTRAMUSCULAR | Status: DC | PRN

## 2013-07-21 MED ORDER — LORAZEPAM 1 MG PO TABS: 1.0000 mg | ORAL_TABLET | Freq: Four times a day (QID) | ORAL | Status: DC | PRN

## 2013-07-21 MED ORDER — LORAZEPAM 2 MG/ML IJ SOLN: 1.0000 mg | Freq: Four times a day (QID) | INTRAMUSCULAR | Status: DC | PRN

## 2013-07-21 MED ORDER — ACETAMINOPHEN 650 MG RE SUPP: 650.0000 mg | Freq: Four times a day (QID) | RECTAL | Status: DC | PRN

## 2013-07-21 MED ORDER — HEPARIN SODIUM (PORCINE) 5000 UNIT/ML IJ SOLN: 5000.0000 [IU] | Freq: Three times a day (TID) | INTRAMUSCULAR | Status: DC

## 2013-07-21 MED ORDER — ADULT MULTIVITAMIN W/MINERALS CH
1.0000 | ORAL_TABLET | Freq: Every day | ORAL | Status: DC
  Administered 2013-07-21 – 2013-07-23 (×3): 1 via ORAL
  Filled 2013-07-21 (×3): qty 1

## 2013-07-21 MED ORDER — HEPARIN SODIUM (PORCINE) 5000 UNIT/ML IJ SOLN
5000.0000 [IU] | Freq: Three times a day (TID) | INTRAMUSCULAR | Status: DC
  Administered 2013-07-22 – 2013-07-23 (×4): 5000 [IU] via SUBCUTANEOUS
  Filled 2013-07-21 (×4): qty 1

## 2013-07-21 MED ORDER — FOLIC ACID 1 MG PO TABS
1.0000 mg | ORAL_TABLET | Freq: Every day | ORAL | Status: DC
  Administered 2013-07-21 – 2013-07-23 (×3): 1 mg via ORAL
  Filled 2013-07-21 (×3): qty 1

## 2013-07-21 NOTE — ED Provider Notes (Signed)
CSN: [id number]     Arrival date & time 07/21/13  1159 History   First MD Initiated Contact with Patient 07/21/13 1212     Chief Complaint  Patient presents with  . Stroke Symptoms     (Consider location/radiation/quality/duration/timing/severity/associated sxs/prior Treatment) HPI 53 year old female presents with slurred speech, left-sided facial droop and feeling clumsy over the last 2 days. Her symptoms started along with a gradually worsening headache. The headache has resolved since yesterday. Patient has not noticed any arm or leg weakness. No numbness. The friend at the bedside relate to patient's up and altered but is been having trouble getting up write words due to her slurred speech. She also seems to running into the wall. No current pain. No current blurry vision.  Past Medical History  Diagnosis Date  . Diabetes mellitus without complication   . Hypertension    No past surgical history on file. No family history on file. History  Substance Use Topics  . Smoking status: Current Every Day Smoker -- 2.00 packs/day    Types: Cigarettes  . Smokeless tobacco: Not on file  . Alcohol Use: Yes     Comment: occasionally   OB History   Grav Para Term Preterm Abortions TAB SAB Ect Mult Living                 Review of Systems  Respiratory: Positive for cough (chronic).   Neurological: Positive for speech difficulty. Negative for numbness and headaches.  All other systems reviewed and are negative.     Allergies  Review of patient's allergies indicates no known allergies.  Home Medications   Prior to Admission medications   Medication Sig Start Date End Date Taking? Authorizing Provider  lisinopril-hydrochlorothiazide (PRINZIDE,ZESTORETIC) 20-12.5 MG per tablet Take 1 tablet by mouth daily.    Historical Provider, MD  metFORMIN (GLUCOPHAGE) 1000 MG tablet Take 2,000 mg by mouth 2 (two) times daily with a meal.    Historical Provider, MD  oxyCODONE-acetaminophen  (PERCOCET/ROXICET) 5-325 MG per tablet Take 1 tablet by mouth every 4 (four) hours as needed for severe pain. 07/07/13   Sharyon Cable, MD   BP 177/97  Pulse 105  Temp(Src) 98.4 F (36.9 C) (Oral)  Resp 23  Ht 5\' 5"  (1.651 m)  Wt 246 lb (111.585 kg)  BMI 40.94 kg/m2  SpO2 99% Physical Exam  Nursing note and vitals reviewed. Constitutional: She is oriented to person, place, and time. She appears well-developed and well-nourished.  HENT:  Head: Normocephalic and atraumatic.  Right Ear: External ear normal.  Left Ear: External ear normal.  Nose: Nose normal.  Eyes: EOM are normal. Pupils are equal, round, and reactive to light. Right eye exhibits no discharge. Left eye exhibits no discharge.  Cardiovascular: Normal rate, regular rhythm and normal heart sounds.   Pulmonary/Chest: Effort normal and breath sounds normal. She has no wheezes. She has no rales.  Abdominal: Soft. There is no tenderness.  Neurological: She is alert and oriented to person, place, and time.  Mild left facial droop. Normal facial sensation. Forehead moves symmetrically. Normal strength in all 4 extremities. Normal finger to nose and heel to shin. Able to ambulate normally  Skin: Skin is warm and dry.    ED Course  Procedures (including critical care time) Labs Review Labs Reviewed  COMPREHENSIVE METABOLIC PANEL - Abnormal; Notable for the following:    Sodium 135 (*)    Glucose, Bld 193 (*)    ALT 37 (*)  Total Bilirubin 0.2 (*)    GFR calc non Af Amer 63 (*)    GFR calc Af Amer 73 (*)    Anion gap 17 (*)    All other components within normal limits  PROTIME-INR  APTT  CBC  DIFFERENTIAL  CBG MONITORING, ED  I-STAT TROPOININ, ED    Imaging Review Ct Head Wo Contrast  07/21/2013   CLINICAL DATA:  Slurred speech, left facial tightness, headache  EXAM: CT HEAD WITHOUT CONTRAST  TECHNIQUE: Contiguous axial images were obtained from the base of the skull through the vertex without intravenous  contrast.  COMPARISON:  None.  FINDINGS: No evidence of parenchymal hemorrhage or extra-axial fluid collection. No mass lesion, mass effect, or midline shift.  No CT evidence of acute infarction.  Subcortical white matter and periventricular small vessel ischemic changes. Intracranial atherosclerosis.  Cerebral volume is within normal limits.  No ventriculomegaly.  Near complete opacification of the left maxillary sinus. Mastoid air cells are clear.  No evidence of calvarial fracture.  IMPRESSION: No evidence of acute intracranial abnormality.  Small vessel ischemic changes with intracranial atherosclerosis.   Electronically Signed   By: Julian Hy M.D.   On: 07/21/2013 13:22     EKG Interpretation   Date/Time:  Tuesday July 21 2013 12:10:55 EDT Ventricular Rate:  107 PR Interval:  150 QRS Duration: 107 QT Interval:  360 QTC Calculation: 480 R Axis:   65 Text Interpretation:  Age not entered, assumed to be  53 years old for  purpose of ECG interpretation Sinus tachycardia Low voltage, precordial  leads Borderline prolonged QT interval Baseline wander in lead(s) V2 V6 No  old tracing to compare Confirmed by [place] ([id number]) on  07/21/2013 12:41:06 PM      MDM   Final diagnoses:  Slurred speech    Patient's exam is concerning for a possible stroke. She has very minimal possible discoordination on the left side. Neurology has evaluated her and feels that she will need MRI but will otherwise need admission for stroke risk factor workup and treatment. MRI is ordered and will admit to the hospitalist.     Ephraim Hamburger, MD 07/21/13 [phone]

## 2013-07-21 NOTE — ED Notes (Signed)
Md Regenia Skeeter at bedside.

## 2013-07-21 NOTE — ED Notes (Signed)
Pt reports that on Sunday around 11 AM  She started to develop slurred speech, left sided facial "tightness", expressive aphagia and "I just feel really clumsy." States symptoms have remained since Sunday. Pt is AO x 4. Denies any pain.

## 2013-07-21 NOTE — ED Notes (Signed)
MD [place] at bedside  

## 2013-07-21 NOTE — H&P (Signed)
[facility] History and Physical  Shannon Price DGU:[id number] DOB: 1960-04-25 DOA: 07/21/2013  Referring physician: Emergency Department PCP: Rubbie Battiest, MD  Specialists:   Chief Complaint: Slurred speech  HPI: Shannon Price is a 53 y.o. female  With a hx of tobacco abuse and daily etoh intake who presents to the ED with L sided facial droop and slurred speech since 7/12. Sx did not improve and the patient subsequently presented to the ED. In the ED, CT head was unremarkable. Neurology was consulted with recommendations for a CVA work up, including MRI. The hosptialist service was consulted for consideration of admission.  Review of Systems:  Per above, the remainder of the 10pt ros reviewed and are neg  Past Medical History  Diagnosis Date  . Diabetes mellitus without complication   . Hypertension    No past surgical history on file. Social History:  reports that she has been smoking Cigarettes.  She has been smoking about 2.00 packs per day. She does not have any smokeless tobacco history on file. She reports that she drinks alcohol. She reports that she does not use illicit drugs.  where does patient live--home, ALF, SNF? and with whom if at home?  Can patient participate in ADLs?  No Known Allergies  Family History  Problem Relation Age of Onset  . Hypertension Mother   . Hypertension Father     (be sure to complete)  Prior to Admission medications   Medication Sig Start Date End Date Taking? Authorizing Provider  Aspirin-Salicylamide-Caffeine (BC HEADACHE POWDER PO) Take 2 packets by mouth daily as needed (for pain).   Yes Historical Provider, MD   Physical Exam: Filed Vitals:   07/21/13 1400 07/21/13 1430 07/21/13 1500 07/21/13 1539  BP: 158/79 169/68 129/68 150/75  Pulse: 96 95 95 101  Temp:    98.2 F (36.8 C)  TempSrc:    Oral  Resp: 17 19 17 22   Height:      Weight:      SpO2: 98% 99% 97% 98%     General:  Awake, in nad  Eyes: PERRL B  ENT:  Membranes moist, dentition fair  Neck: trachea midline, neck supple  Cardiovascular: regular, s1, s2  Respiratory: normal resp effort, no wheezing  Abdomen: soft, nondistended  Skin: normal skin turgor, no abnormal skin lesions seen  Musculoskeletal: perfused, no clubbing  Psychiatric: mood/affect normal// no auditory/visual hallucinations  Neurologic: cn2-12 grossly intact // strengh/sensation intact  Labs on Admission:  Basic Metabolic Panel:  Recent Labs Lab 07/21/13 1233  NA 135*  K 4.2  CL 98  CO2 20  GLUCOSE 193*  BUN 15  CREATININE 1.02  CALCIUM 9.4   Liver Function Tests:  Recent Labs Lab 07/21/13 1233  AST 23  ALT 37*  ALKPHOS 101  BILITOT 0.2*  PROT 7.0  ALBUMIN 3.5   No results found for this basename: LIPASE, AMYLASE,  in the last 168 hours No results found for this basename: AMMONIA,  in the last 168 hours CBC:  Recent Labs Lab 07/21/13 1233  WBC 5.6  NEUTROABS 3.4  HGB 15.0  HCT 44.6  MCV 94.5  PLT 192   Cardiac Enzymes: No results found for this basename: CKTOTAL, CKMB, CKMBINDEX, TROPONINI,  in the last 168 hours  BNP (last 3 results) No results found for this basename: PROBNP,  in the last 8760 hours CBG: No results found for this basename: GLUCAP,  in the last 168 hours  Radiological Exams on Admission: Ct  Head Wo Contrast  07/21/2013   CLINICAL DATA:  Slurred speech, left facial tightness, headache  EXAM: CT HEAD WITHOUT CONTRAST  TECHNIQUE: Contiguous axial images were obtained from the base of the skull through the vertex without intravenous contrast.  COMPARISON:  None.  FINDINGS: No evidence of parenchymal hemorrhage or extra-axial fluid collection. No mass lesion, mass effect, or midline shift.  No CT evidence of acute infarction.  Subcortical white matter and periventricular small vessel ischemic changes. Intracranial atherosclerosis.  Cerebral volume is within normal limits.  No ventriculomegaly.  Near complete  opacification of the left maxillary sinus. Mastoid air cells are clear.  No evidence of calvarial fracture.  IMPRESSION: No evidence of acute intracranial abnormality.  Small vessel ischemic changes with intracranial atherosclerosis.   Electronically Signed   By: Julian Hy M.D.   On: 07/21/2013 13:22     Assessment/Plan Principal Problem:   CVA (cerebral infarction) Active Problems:   Slurred speech   Tobacco abuse   Alcohol dependence, daily use   1. Questionable CVA 1. Findings are suggestive of CVA 2. Will cont on daily ASA 3. Neurology consulted 4. Will follow up on MRI brain, carotid dopplers, and 2d echo 5. Admit to med-tele 2. Tobacco abuse 1. 64min tobacco cessation done face to face 2. Pt does not want patch 3. Daily ETOH use 1. Reports drinking daily 2. Will cont on CIWA 4. DVT prophylaxis 1. Heparin subq  Code Status: Full (must indicate code status--if unknown or must be presumed, indicate so) Family Communication: Pt and family in room (indicate person spoken with, if applicable, with phone number if by telephone) Disposition Plan: Pending (indicate anticipated LOS)  Time spent: 25min  Griffin Dewilde, [facility] Pager [phone]  If 7PM-7AM, please contact night-coverage [email or web address] Password [facility] 07/21/2013, 4:06 PM

## 2013-07-21 NOTE — ED Notes (Signed)
Pt returned from CT °

## 2013-07-21 NOTE — ED Notes (Signed)
Attempted report x1. 

## 2013-07-21 NOTE — Consult Note (Signed)
Referring Physician: Regenia Skeeter    Chief Complaint: Slurred speech  HPI:                                                                                                                                         Shannon Price is an right handed  52 y.o. female who has not been on her medications for both DM and HTN for the past year. In additon she smokes 3 PKS per day of cigarettes. Two days ago she noted a sudden onset ofspeech difficulty("I sound like a retard") and a sensation of left face "pulling".  This has persisted, for two days.  Currently she has a mild 3/10 HA on the right parietal region of her head, described as constant and started after the CT head while in ED.  She denies any blurred vision, numbness or tingling in extremities.    Date last known well: Date: 07/19/2013 Time last known well: Unable to determine tPA Given: No: out of window  Past Medical History  Diagnosis Date  . Diabetes mellitus without complication   . Hypertension     No past surgical history on file.  Family history: No hx similar  Social History:  reports that she has been smoking Cigarettes.  She has been smoking about 2.00 packs per day. She does not have any smokeless tobacco history on file. She reports that she drinks alcohol. She reports that she does not use illicit drugs.  Allergies: No Known Allergies  Medications:                                                                                                                           No current facility-administered medications for this encounter.   Current Outpatient Prescriptions  Medication Sig Dispense Refill  . Aspirin-Salicylamide-Caffeine (BC HEADACHE POWDER PO) Take 2 packets by mouth daily as needed (for pain).         ROS:  History obtained from the patient  General ROS: negative for - chills,  fatigue, fever, night sweats, weight gain or weight loss Psychological ROS: negative for - behavioral disorder, hallucinations, memory difficulties, mood swings or suicidal ideation Ophthalmic ROS: negative for - blurry vision, double vision, eye pain or loss of vision ENT ROS: negative for - epistaxis, nasal discharge, oral lesions, sore throat, tinnitus or vertigo Allergy and Immunology ROS: negative for - hives or itchy/watery eyes Hematological and Lymphatic ROS: negative for - bleeding problems, bruising or swollen lymph nodes Endocrine ROS: negative for - galactorrhea, hair pattern changes, polydipsia/polyuria or temperature intolerance Respiratory ROS: negative for - cough, hemoptysis, shortness of breath or wheezing Cardiovascular ROS: negative for - chest pain, dyspnea on exertion, edema or irregular heartbeat Gastrointestinal ROS: negative for - abdominal pain, diarrhea, hematemesis, nausea/vomiting or stool incontinence Genito-Urinary ROS: negative for - dysuria, hematuria, incontinence or urinary frequency/urgency Musculoskeletal ROS: negative for - joint swelling or muscular weakness Neurological ROS: as noted in HPI Dermatological ROS: negative for rash and skin lesion changes  Neurologic Examination:                                                                                                      Blood pressure 158/79, pulse 96, temperature 98.4 F (36.9 C), temperature source Oral, resp. rate 17, height 5\' 5"  (1.651 m), weight 111.585 kg (246 lb), SpO2 98.00%.   General: in bed in NAD Mental Status: Alert, oriented, thought content appropriate.  Speech stuttering but fluent without evidence of aphasia.  Able to follow 3 step commands without difficulty. Cranial Nerves: II: Discs flat bilaterally; Visual fields grossly normal, pupils equal, round, reactive to light and accommodation III,IV, VI: ptosis not present, extra-ocular motions intact bilaterally V,VII: smile with  left facial weakness, facial light touch sensation normal bilaterally VIII: hearing normal bilaterally IX,X: gag reflex present XI: bilateral shoulder shrug XII: midline tongue extension without atrophy or fasciculations  Motor: Right : Upper extremity   5/5    Left:     Upper extremity   5/5  Lower extremity   5/5     Lower extremity   5/5 Tone and bulk:normal tone throughout; no atrophy noted Sensory: Pinprick and light touch intact throughout, bilaterally Deep Tendon Reflexes:  Right: Upper Extremity   Left: Upper extremity   biceps (C-5 to C-6) 2/4   biceps (C-5 to C-6) 2/4 tricep (C7) 2/4    triceps (C7) 2/4 Brachioradialis (C6) 2/4  Brachioradialis (C6) 2/4  Lower Extremity Lower Extremity  quadriceps (L-2 to L-4) 2/4   quadriceps (L-2 to L-4) 2/4 Achilles (S1) 1/4   Achilles (S1) 1/4  Plantars: Right: downgoing   Left: downgoing Cerebellar: normal finger-to-nose,  normal heel-to-shin test on right, mild dyscoordination on left in both arm and leg.  Gait: not tested.  CV: pulses palpable throughout    Lab Results: Basic Metabolic Panel:  Recent Labs Lab 07/21/13 1233  NA 135*  K 4.2  CL 98  CO2 20  GLUCOSE 193*  BUN 15  CREATININE 1.02  CALCIUM 9.4  Liver Function Tests:  Recent Labs Lab 07/21/13 1233  AST 23  ALT 37*  ALKPHOS 101  BILITOT 0.2*  PROT 7.0  ALBUMIN 3.5   No results found for this basename: LIPASE, AMYLASE,  in the last 168 hours No results found for this basename: AMMONIA,  in the last 168 hours  CBC:  Recent Labs Lab 07/21/13 1233  WBC 5.6  NEUTROABS 3.4  HGB 15.0  HCT 44.6  MCV 94.5  PLT 192    Cardiac Enzymes: No results found for this basename: CKTOTAL, CKMB, CKMBINDEX, TROPONINI,  in the last 168 hours  Lipid Panel: No results found for this basename: CHOL, TRIG, HDL, CHOLHDL, VLDL, LDLCALC,  in the last 168 hours  CBG: No results found for this basename: GLUCAP,  in the last 168 hours  Microbiology: No  results found for this or any previous visit.  Coagulation Studies:  Recent Labs  07/21/13 1233  LABPROT 12.9  INR 0.97    Imaging: Ct Head Wo Contrast  07/21/2013   CLINICAL DATA:  Slurred speech, left facial tightness, headache  EXAM: CT HEAD WITHOUT CONTRAST  TECHNIQUE: Contiguous axial images were obtained from the base of the skull through the vertex without intravenous contrast.  COMPARISON:  None.  FINDINGS: No evidence of parenchymal hemorrhage or extra-axial fluid collection. No mass lesion, mass effect, or midline shift.  No CT evidence of acute infarction.  Subcortical white matter and periventricular small vessel ischemic changes. Intracranial atherosclerosis.  Cerebral volume is within normal limits.  No ventriculomegaly.  Near complete opacification of the left maxillary sinus. Mastoid air cells are clear.  No evidence of calvarial fracture.  IMPRESSION: No evidence of acute intracranial abnormality.  Small vessel ischemic changes with intracranial atherosclerosis.   Electronically Signed   By: Julian Hy M.D.   On: 07/21/2013 13:22       Assessment and plan discussed with with attending physician and they are in agreement.    Etta Quill PA-C [facility] [phone]  07/21/2013, 2:13 PM   Assessment: 53 y.o. female with two day history of stuttering speech and left facial weakness. I suspect a left cerebellar or pontine stroke. She has multipel risk factors.   Stroke Risk Factors - hyperlipidemia and hypertension  Plan: 1. HgbA1c, fasting lipid panel 2. MRI, MRA  of the brain without contrast 3. Frequent neuro checks 4. Echocardiogram 5. Carotid dopplers 6. Prophylactic therapy-Antiplatelet med: Aspirin - dose 325mg  PO or 300mg  PR 7. Risk factor modification 8. Telemetry monitoring 9. PT consult, OT consult, Speech consult 10. I advised the patient to stop smoking   Roland Rack, MD [facility] [phone]  If 7pm-  7am, please page neurology on call as listed in [place].

## 2013-07-22 ENCOUNTER — Observation Stay (HOSPITAL_COMMUNITY): Payer: 59

## 2013-07-22 DIAGNOSIS — G459 Transient cerebral ischemic attack, unspecified: Secondary | ICD-10-CM

## 2013-07-22 DIAGNOSIS — I6350 Cerebral infarction due to unspecified occlusion or stenosis of unspecified cerebral artery: Principal | ICD-10-CM

## 2013-07-22 LAB — CBC
HEMATOCRIT: 41.7 % (ref 36.0–46.0)
Hemoglobin: 13.7 g/dL (ref 12.0–15.0)
MCH: 30.9 pg (ref 26.0–34.0)
MCHC: 32.9 g/dL (ref 30.0–36.0)
MCV: 94.1 fL (ref 78.0–100.0)
Platelets: 209 10*3/uL (ref 150–400)
RBC: 4.43 MIL/uL (ref 3.87–5.11)
RDW: 12.8 % (ref 11.5–15.5)
WBC: 6 10*3/uL (ref 4.0–10.5)

## 2013-07-22 LAB — GLUCOSE, CAPILLARY
GLUCOSE-CAPILLARY: 165 mg/dL — AB (ref 70–99)
GLUCOSE-CAPILLARY: 194 mg/dL — AB (ref 70–99)
Glucose-Capillary: 250 mg/dL — ABNORMAL HIGH (ref 70–99)
Glucose-Capillary: 252 mg/dL — ABNORMAL HIGH (ref 70–99)

## 2013-07-22 LAB — COMPREHENSIVE METABOLIC PANEL
ALBUMIN: 3.4 g/dL — AB (ref 3.5–5.2)
ALK PHOS: 91 U/L (ref 39–117)
ALT: 33 U/L (ref 0–35)
ANION GAP: 15 (ref 5–15)
AST: 29 U/L (ref 0–37)
BUN: 10 mg/dL (ref 6–23)
CHLORIDE: 99 meq/L (ref 96–112)
CO2: 24 mEq/L (ref 19–32)
Calcium: 8.9 mg/dL (ref 8.4–10.5)
Creatinine, Ser: 0.53 mg/dL (ref 0.50–1.10)
GFR calc Af Amer: >90 mL/min (ref >90)
GFR calc non Af Amer: >90 mL/min (ref >90)
Glucose, Bld: 199 mg/dL — ABNORMAL HIGH (ref 70–99)
Potassium: 4.4 mEq/L (ref 3.7–5.3)
Sodium: 138 mEq/L (ref 137–147)
Total Bilirubin: 0.3 mg/dL (ref 0.3–1.2)
Total Protein: 6.8 g/dL (ref 6.0–8.3)

## 2013-07-22 LAB — LIPID PANEL
CHOL/HDL RATIO: 8.3 ratio
Cholesterol: 256 mg/dL — ABNORMAL HIGH (ref 0–200)
HDL: 31 mg/dL — ABNORMAL LOW (ref >39)
LDL Cholesterol: UNDETERMINED mg/dL (ref 0–99)
TRIGLYCERIDES: 554 mg/dL — AB (ref <150)
VLDL: UNDETERMINED mg/dL (ref 0–40)

## 2013-07-22 LAB — HEMOGLOBIN A1C
Hgb A1c MFr Bld: 7.6 % — ABNORMAL HIGH (ref <5.7)
MEAN PLASMA GLUCOSE: 171 mg/dL — AB (ref <117)

## 2013-07-22 MED ORDER — ATORVASTATIN CALCIUM 10 MG PO TABS
10.0000 mg | ORAL_TABLET | Freq: Every day | ORAL | Status: DC
  Administered 2013-07-22: 10 mg via ORAL
  Filled 2013-07-22: qty 1

## 2013-07-22 NOTE — Progress Notes (Signed)
PT Cancellation Note  Patient Details Name: Shannon Price MRN: [id number] DOB: [birth year]   Cancelled Treatment:    Reason Eval/Treat Not Completed: Patient at procedure or test/unavailable. Pt off the floor at vascular; will re-attempt to see pt at next available time.    Elie Confer [place], [place] 07/22/2013, 10:23 AM

## 2013-07-22 NOTE — Progress Notes (Signed)
Echocardiogram 2D Echocardiogram has been performed.  07/22/2013 11:20 AM Maudry Mayhew, RVT, RDCS, RDMS

## 2013-07-22 NOTE — Evaluation (Addendum)
Speech Language Pathology Evaluation Patient Details Name: Shannon Price MRN: [id number] DOB: Jun 30, 1960 Today's Date: 07/22/2013 Time: 8338-2505 SLP Time Calculation (min): 38 min  Problem List:  Patient Active Problem List   Diagnosis Date Noted  . Slurred speech 07/21/2013  . CVA (cerebral infarction) 07/21/2013  . Tobacco abuse 07/21/2013  . Alcohol dependence, daily use 07/21/2013   Past Medical History:  Past Medical History  Diagnosis Date  . Hypertension   . High cholesterol   . Type II diabetes mellitus   . LZJQBHAL(937.9)     "weekly" (07/21/2013)  . CVA (cerebral vascular accident) 07/2013    "just affected my speech"   Past Surgical History:  Past Surgical History  Procedure Laterality Date  . Cesarean section  1982   HPI:  Shannon Price is a right handed 53 y.o. female who has not been on her medications for both DM and HTN for the past year. In additon she smokes 3 PKS per day of cigarettes. Two days ago she noted a sudden onset ofspeech difficulty and a sensation of left face "pulling". She denies any blurred vision, numbness or tingling in extremities. MRI revealed multifocal cerebral hemispheric white matter lesions with differential diagnosis of multiple sclerosis versus multiple white matter infarctions due to atherosclerosis or vasculitis.    Assessment / Plan / Recommendation Clinical Impression  Pt was seen for cognitive-linguistic evaluation; bedside-swallow evaluation was deferred and order cancelled per MD Ree Kida as pt passed the stroke swallow screen and RN reports no overt difficulties observed.   Pt presents with discoordinated movement of her articulators, resulting in imprecise articulation and slowed rate of speech. SLP provided Min cues for over articulation and pacing to increase fluidity and clarity of speech at the sentence level during a structured task. Pt also describes a declining ability to recall new information over the past year or so,  which has further declined with the onset of her speech changes. Pt will benefit from skilled SLP services to maximize functional recall and communication prior to d/c home with 24/7 supervision from family. Recommend f/u OP SLP services upon discharge.    SLP Assessment  Patient needs continued Speech Lanaguage Pathology Services    Follow Up Recommendations  Outpatient SLP;24 hour supervision/assistance    Frequency and Duration min 2x/week  1 week   Pertinent Vitals/Pain n/a   SLP Goals  SLP Goals Potential to Achieve Goals: Good Potential Considerations: Ability to learn/carryover information;Previous level of function  SLP Evaluation Prior Functioning  Cognitive/Linguistic Baseline: Baseline deficits Baseline deficit details: patient and husband describe baseline memory loss that had started over the last year or so  Lives With: Spouse Available Help at Discharge: Family;Available 24 hours/day 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[place])   Cognition  Overall Cognitive Status: Impaired/Different from baseline Arousal/Alertness: Awake/alert Orientation Level: Oriented X4 Attention: Selective Selective Attention: Appears intact Memory: Impaired Memory Impairment: Storage deficit;Retrieval deficit;Decreased recall of new information;Decreased short term memory Awareness: Impaired Awareness Impairment: Anticipatory impairment Problem Solving: Appears intact (with basic tasks) Safety/Judgment: Impaired    Comprehension  Auditory Comprehension Overall Auditory Comprehension: Appears within functional limits for tasks assessed Visual Recognition/Discrimination Discrimination: Not tested Reading Comprehension Reading Status: Not tested    Expression Expression Primary Mode of Expression: Verbal Verbal Expression Overall Verbal Expression: Appears within functional limits for tasks assessed Written Expression Written Expression: Not tested   Oral  / Motor Oral Motor/Sensory Function Overall Oral Motor/Sensory Function: Appears within functional limits  for tasks assessed Motor Speech Overall Motor Speech: Impaired Respiration: Within functional limits Phonation: Normal Resonance: Within functional limits Articulation: Impaired Level of Impairment: Conversation Intelligibility: Intelligible Motor Planning: Witnin functional limits Effective Techniques: Over-articulate;Slow rate   GO Functional Assessment Tool Used: skilled clinical judgment Functional Limitations: Motor speech Motor Speech Current Status [phone]): At least 20 percent but less than 40 percent impaired, limited or restricted Motor Speech Goal Status [phone]): At least 1 percent but less than 20 percent impaired, limited or restricted    Germain Osgood, M.A. CCC-SLP [phone]  Germain Osgood 07/22/2013, 9:34 AM

## 2013-07-22 NOTE — Progress Notes (Signed)
VASCULAR LAB PRELIMINARY  PRELIMINARY  PRELIMINARY  PRELIMINARY  Carotid duplex completed.    Preliminary report:  Bilateral:  1-39% ICA stenosis.  Vertebral artery flow is antegrade.     [place], RVS 07/22/2013, 11:06 AM

## 2013-07-22 NOTE — Evaluation (Signed)
Physical Therapy Evaluation Patient Details Name: Shannon Price MRN: [id number] DOB: Jan 09, 1960 Today's Date: 07/22/2013   History of Present Illness  53 y.o. female with a history of tobacco abuse and daily alcohol intake, presented to the ED with Left sided facial droop and slurred speech since 7/12. Symptoms did not improve and the patient subsequently presented to the ED. In the ED, CT head was unemarkable. Neurology was consulted with recommendations for a CVA work up, including MRI. MRI revealed multifocal cerebral hemispheric white matter lesions with differential diagnosis of multiple sclerosis versus multiple white matter infarctions due to atherosclerosis or vasculitis.    Clinical Impression  Pt adm from home due to the above. No focal weakness or balance deficits noted during evaluation. Pt able to perform high level balance activities and ambulate steps at mod i. No further acute PT needs warranted at this time. Pt continues to have dysarthric, slurred speech. Educated pt and husband extensively on stroke signs, symptoms and importance of early detection, handout is in room. Will sign off at this time. Thanks.     Follow Up Recommendations No PT follow up    Equipment Recommendations  None recommended by PT    Recommendations for Other Services       Precautions / Restrictions Precautions Precautions: None Restrictions Weight Bearing Restrictions: No      Mobility  Bed Mobility               General bed mobility comments: not addressed; pt up in chair and returned to chair; denied any need for (A) for bed mobility at this time   Transfers Overall transfer level: Independent Equipment used: None             General transfer comment: no LOB or sway noted  Ambulation/Gait Ambulation/Gait assistance: Modified independent (Device/Increase time) Ambulation Distance (Feet): 500 Feet Assistive device: None Gait Pattern/deviations: WFL(Within Functional  Limits) Gait velocity: slightly decreased vs baseline  Gait velocity interpretation: Below normal speed for age/gender General Gait Details: pt challenged with high level balance activities; no LOB or focal balance deficits noted during session  Stairs Stairs: Yes Stairs assistance: Modified independent (Device/Increase time) Stair Management: No rails;Step to pattern;Two rails;Forwards Number of Stairs: 15 (5x3) General stair comments: initially pt ambulating with handrails; then pt stated at home she will not have handrails; supervision initially' progressed to mod I with step to gt pattern; no LOB noted  Wheelchair Mobility    Modified Rankin (Stroke Patients Only) Modified Rankin (Stroke Patients Only) Pre-Morbid Rankin Score: No symptoms Modified Rankin: No significant disability     Balance Overall balance assessment: Modified Independent                           High level balance activites: Direction changes;Turns;Sudden stops;Head turns High Level Balance Comments: able to pick objects up off ground and navigate obstacles without difficulty             Pertinent Vitals/Pain Denied any pain or numbness/tingling     Home Living Family/patient expects to be discharged to:: Private residence Living Arrangements: Spouse/significant other;Children Available Help at Discharge: Family;Available 24 hours/day Type of Home: Mobile home Home Access: Stairs to enter Entrance Stairs-Rails: None Entrance Stairs-Number of Steps: 3 Home Layout: One level Home Equipment: None      Prior Function Level of Independence: Independent         Comments: pt works at [employer] in [place]; drives  and is very independent      Hand Dominance        Extremity/Trunk Assessment   Upper Extremity Assessment: Overall WFL for tasks assessed           Lower Extremity Assessment: Overall WFL for tasks assessed      Cervical / Trunk Assessment: Normal   Communication   Communication: Other (comment) (slurred; dysarthric speech)  Cognition Arousal/Alertness: Awake/alert Behavior During Therapy: Agitated Overall Cognitive Status: Within Functional Limits for tasks assessed                      General Comments General comments (skin integrity, edema, etc.): educated pt and husband on stroke signs and symptoms and importance of early detection     Exercises        Assessment/Plan    PT Assessment Patent does not need any further PT services  PT Diagnosis     PT Problem List    PT Treatment Interventions     PT Goals (Current goals can be found in the Care Plan section) Acute Rehab PT Goals Patient Stated Goal: home today PT Goal Formulation: No goals set, d/c therapy    Frequency     Barriers to discharge        Co-evaluation               End of Session Equipment Utilized During Treatment: Gait belt Activity Tolerance: Patient tolerated treatment well Patient left: in chair;with call bell/phone within reach;with family/visitor present Nurse Communication: Mobility status         Time: 2202-5427 PT Time Calculation (min): 17 min   Charges:   PT Evaluation $Initial PT Evaluation Tier I: 1 Procedure PT Treatments $Gait Training: 8-22 mins   PT G CodesGustavus Bryant, [place]  [phone] 07/22/2013, 3:06 PM

## 2013-07-22 NOTE — Progress Notes (Signed)
Stroke Team Progress Note  HISTORY Chief Complaint: Slurred speech  HPI:  Shannon Price is an right handed 53 y.o. female who has not been on her medications for both DM and HTN for the past year. In additon she smokes 3 PKS per day of cigarettes. Two days ago she noted a sudden onset ofspeech difficulty("I sound like a retard") and a sensation of left face "pulling". This has persisted, for two days. Currently she has a mild 3/10 HA on the right parietal region of her head, described as constant and started after the CT head while in ED. She denies any blurred vision, numbness or tingling in extremities.  Date last known well: Date: 07/19/2013  Time last known well: Unable to determine  tPA Given: No: out of window  Patient was not administered TPA secondary to unknown last known well. She was admitted to the Internal Medicine Service for further evaluation and treatment. Neurology is consulting.   SUBJECTIVE No acute events overnight. Family is at the bedside. The patient is alert and conversant.  OBJECTIVE Most recent Vital Signs: Filed Vitals:   07/22/13 0215 07/22/13 0415 07/22/13 0611 07/22/13 1006  BP: 155/84 157/81 164/94 153/92  Pulse: 95 86 81 91  Temp: 97.8 F (36.6 C) 97.6 F (36.4 C) 98.4 F (36.9 C) 98.4 F (36.9 C)  TempSrc: Oral Oral Oral Axillary  Resp: 18 18 18 18   Height:      Weight:      SpO2: 100% 100% 99% 99%   CBG (last 3)   Recent Labs  07/21/13 1722 07/21/13 2224 07/22/13 0711  GLUCAP 254* 229* 250*    IV Fluid Intake:     MEDICATIONS  . aspirin  325 mg Oral Daily  . folic acid  1 mg Oral Daily  . heparin  5,000 Units Subcutaneous 3 times per day  . insulin aspart  0-15 Units Subcutaneous TID WC  . insulin aspart  0-5 Units Subcutaneous QHS  . LORazepam  0-4 mg Intravenous Q6H   Followed by  . [START ON 07/23/2013] LORazepam  0-4 mg Intravenous Q12H  . multivitamin with minerals  1 tablet Oral Daily  . pneumococcal 23 valent vaccine  0.5  mL Intramuscular Tomorrow-1000  . sodium chloride  3 mL Intravenous Q12H  . thiamine  100 mg Oral Daily   PRN:  acetaminophen, acetaminophen, LORazepam, LORazepam, morphine injection, ondansetron (ZOFRAN) IV, ondansetron  Diet:  Carb Control thin liquids Activity:  Up with assistance DVT Prophylaxis:  SQ heparin  CLINICALLY SIGNIFICANT STUDIES Basic Metabolic Panel:  Recent Labs Lab 07/21/13 1233 07/21/13 1616 07/22/13 0701  NA 135*  --  138  K 4.2  --  4.4  CL 98  --  99  CO2 20  --  24  GLUCOSE 193*  --  199*  BUN 15  --  10  CREATININE 1.02 0.71 0.53  CALCIUM 9.4  --  8.9   Liver Function Tests:  Recent Labs Lab 07/21/13 1233 07/22/13 0701  AST 23 29  ALT 37* 33  ALKPHOS 101 91  BILITOT 0.2* 0.3  PROT 7.0 6.8  ALBUMIN 3.5 3.4*   CBC:  Recent Labs Lab 07/21/13 1233 07/21/13 1616 07/22/13 0701  WBC 5.6 6.7 6.0  NEUTROABS 3.4  --   --   HGB 15.0 14.2 13.7  HCT 44.6 41.6 41.7  MCV 94.5 93.3 94.1  PLT 192 208 209   Coagulation:  Recent Labs Lab 07/21/13 1233  LABPROT 12.9  INR  0.97   Cardiac Enzymes: No results found for this basename: CKTOTAL, CKMB, CKMBINDEX, TROPONINI,  in the last 168 hours Urinalysis: No results found for this basename: COLORURINE, APPERANCEUR, LABSPEC, PHURINE, GLUCOSEU, HGBUR, BILIRUBINUR, KETONESUR, PROTEINUR, UROBILINOGEN, NITRITE, LEUKOCYTESUR,  in the last 168 hours Lipid Panel    Component Value Date/Time   CHOL 256* 07/22/2013 0701   TRIG 554* 07/22/2013 0701   HDL 31* 07/22/2013 0701   CHOLHDL 8.3 07/22/2013 0701   VLDL UNABLE TO CALCULATE IF TRIGLYCERIDE OVER 400 mg/dL 07/22/2013 0701   LDLCALC UNABLE TO CALCULATE IF TRIGLYCERIDE OVER 400 mg/dL 07/22/2013 0701   HgbA1C  Lab Results  Component Value Date   HGBA1C 7.6* 07/22/2013    Urine Drug Screen:     Component Value Date/Time   LABOPIA NONE DETECTED 07/21/2013 2223   COCAINSCRNUR NONE DETECTED 07/21/2013 2223   LABBENZ NONE DETECTED 07/21/2013 2223   AMPHETMU NONE  DETECTED 07/21/2013 2223   THCU NONE DETECTED 07/21/2013 2223   LABBARB NONE DETECTED 07/21/2013 2223    Alcohol Level:  Recent Labs Lab 07/21/13 1616  ETH <11    Ct Head Wo Contrast  07/21/2013 IMPRESSION: No evidence of acute intracranial abnormality.  Small vessel ischemic changes with intracranial atherosclerosis.     MRI/A Brain Wo Contrast  07/22/2013 IMPRESSION: Multifocal cerebral hemispheric white matter lesions including a 1 cm region of restricted diffusion within the mid portion of the corpus callosum to the left of midline and a few areas of lower level restricted diffusion in the white matter. The differential diagnosis is that of multiple sclerosis versus multiple white matter infarctions due to atherosclerosis or vasculitis.  MR angiography does not show any major vessel occlusion. There is considerable irregularity in the carotid siphon regions and some irregularity of the more distal intracranial branch vessels that go along with the presence of vascular pathology.  3 mm left PCOM aneurysm.     Carotid Doppler Prelim <39% stenosis bilat ICAs  2D Echocardiogram EF 50-55%, no definite source of emboli identified  EKG  Sinus tachycardia. For complete results please see formal report.   Therapy Recommendations PT recs no follow up needed  Physical Exam Blood pressure 160/95, pulse 86, temperature 98.1 F (36.7 C), temperature source Oral, resp. rate 18, height 5\' 5"  (1.651 m), weight 111.585 kg (246 lb), SpO2 99.00%. Gen: Patient is well developed, well nourished elderly woman in no acute distress.  Cardiac: RRR. S1S2 audible. No M/R/G.  Extremities: Cap refill <2 secs. No cyanosis or edema. Pulses 2+ radial and DP. Pulmonary: Respirations regular, symmetric. Lungs clear to auscultation bilat. Abd: Soft, non-tender. BS audible x 4 quadrants.  G/U: Deferred  MS: Alert, follows commands. Oriented to person, place, time, and event. Speech: Speech dysfluent and  Mildly with  expressive difficultiesdysarthric. Able to name and repeat. No alexia or agraphia.   CN: No visual field cut. PERRL. EOMs intact. Facial sensation intact V1-3. No facial droop. Hearing grossly intact. Strong cough. Sternocleidomastoids and trapezius 5/5 strength. Tongue midline, full strength, no atrophy or fasciculations.  Strength: 5/5 in all four extremities proximally and distally.  Sensation: Intact to light touch in all four extremities.  Coordination: No ataxia or dysmetria on FTN or HTS bilat. Gait steady.  Proprioception: Negative Romberg.   Reflexes: 2+ biceps, brachioradialis bilat. 2+ patellar, achilles bilat. No clonus. Downgoing toes bilat.  NIHSS 0  ASSESSMENT/PLAN Shannon Price is a 53 y.o. female presenting with speech difficulty and clumsiness.  IV t-PA not given  due to unknown last known well. Imaging confirms infarct in corpus callosum in distribution of MCA/ACA perforators. Infarct felt to be secondary to small vessel ischemic disease.  Not on  Antithrombotic medication prior to admission. Now on aspirin 325 mg orally every day for secondary stroke prevention. Patient with resultant mild expressive speech difficulties . Stroke work up underway.  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 2D Echo shows no definite cardiac source of emboli, normal EF Carotid US Prelim <39% stenosis bilat ICAs  LDL unable to calculate due to triglycerides 554, started on atorvastatin 10 mg, will need to be increased for more intensive statin therapy with goal LDL <70  HgbA1c 7.6  2 PPD Smoker-strongly counseled to quit   Hospital day # 1   SIGNED Delbert Phenix, MSN, ANP-C, CNRN, MSCS [facility] Stroke Team [phone]  I, the attending vascular neurologist, have personally obtained a history, examined the patient, evaluated laboratory data and imaging studies, and formulated the assessment and plan of care.  I have made any additions or clarifications  directly to the above note and agree with the findings and plan as currently documented. Antony Contras, MD   To contact Stroke Continuity provider, please refer to [email or web address] After hours, contact General Neurology

## 2013-07-22 NOTE — Progress Notes (Signed)
OT Cancellation Note  Patient Details Name: MAKYNNA MANOCCHIO MRN: [id number] DOB: 02/23/1960   Cancelled Treatment:    Reason Eval/Treat Not Completed: OT screened, no needs identified, will sign off. Per PT, pt has no OT needs. Screened pt and pt reports no changes in vision. Pt has no OT needs and acute OT will sign off.   Juluis Rainier [phone] 07/22/2013, 5:30 PM

## 2013-07-22 NOTE — Progress Notes (Signed)
Utilization review completed.  

## 2013-07-22 NOTE — Progress Notes (Signed)
[facility]                                                                              Patient Demographics  Shannon Price, is a 53 y.o. female, DOB - 1960/11/09, VZC:[id number]  Admit date - 07/21/2013   Admitting Physician Donne Hazel, MD  Outpatient Primary MD for the patient is Rubbie Battiest, MD  LOS - 1   Chief Complaint  Patient presents with  . Stroke Symptoms   HPI on 07/21/2013    AI SONNENFELD is a 53 y.o. female with a history of tobacco abuse and daily alcohol intake, presented to the ED with Left sided facial droop and slurred speech since 7/12. Symptoms did not improve and the patient subsequently presented to the ED. In the ED, CT head was unemarkable. Neurology was consulted with recommendations for a CVA work up, including MRI. The hosptialist service was consulted for consideration of admission.   Assessment & Plan  Acute CVA -CT head negative for acute intracranial abnormality -MRI/MRA of the brain: 1 cm region of restricted diffusion affecting the corpus callosum to the left of midline, 52mm left PCOM aneurysm, irregularity in the carotid siphon regions and the more distal intracranial branch vessels  -Neurology consulted and appreciated -pending Hb AIC, echocardiogram, PT/OT/ speech evaluations -Lipid panel: Total cholesterol 256, triglycerides 54, HDL 31, LDL unable to calculate -Continue Aspirin 325mg  daily. -Will start statin   Diabetes mellitus -Patient on no medication -Pending hemoglobin A1c -Continue insulin sliding scale with CBG monitoring  Tobacco Abuse -Smoking cessation counseling given  Alcohol Abuse -Alcohol abuse counseling given -Continue CIWA protocol  Hyperlipidemia -Will start statin -Lipid panel as above  Code Status: Full  Family Communication: Husband at bedside  Disposition Plan: Admitted  Time Spent in minutes   30 minutes  Procedures  Echocardiogram  Consults   Neurology  DVT Prophylaxis  Heparin  Lab  Results  Component Value Date   PLT 209 07/22/2013    Medications  Scheduled Meds: . aspirin  325 mg Oral Daily  . folic acid  1 mg Oral Daily  . heparin  5,000 Units Subcutaneous 3 times per day  . insulin aspart  0-15 Units Subcutaneous TID WC  . insulin aspart  0-5 Units Subcutaneous QHS  . LORazepam  0-4 mg Intravenous Q6H   Followed by  . [START ON 07/23/2013] LORazepam  0-4 mg Intravenous Q12H  . multivitamin with minerals  1 tablet Oral Daily  . pneumococcal 23 valent vaccine  0.5 mL Intramuscular Tomorrow-1000  . sodium chloride  3 mL Intravenous Q12H  . thiamine  100 mg Oral Daily   Continuous Infusions:  PRN Meds:.acetaminophen, acetaminophen, LORazepam, LORazepam, morphine injection, ondansetron (ZOFRAN) IV, ondansetron  Antibiotics    Anti-infectives   None      Subjective:   Shannon Price seen and examined today.  Patient feels her speech has improved. She currently wants to go home. She denies any chest pain, shortness of breath, dizziness, headache, abdominal pain.  Objective:   Filed Vitals:   07/22/13 0215 07/22/13 0415 07/22/13 0611 07/22/13 1006  BP: 155/84 157/81 164/94 153/92  Pulse: 95 86 81 91  Temp: 97.8  F (36.6 C) 97.6 F (36.4 C) 98.4 F (36.9 C) 98.4 F (36.9 C)  TempSrc: Oral Oral Oral Axillary  Resp: 18 18 18 18   Height:      Weight:      SpO2: 100% 100% 99% 99%    Wt Readings from Last 3 Encounters:  07/21/13 111.585 kg (246 lb)  07/07/13 111.585 kg (246 lb)  05/04/12 111.585 kg (246 lb)     Intake/Output Summary (Last 24 hours) at 07/22/13 1300 Last data filed at 07/22/13 1000  Gross per 24 hour  Intake    480 ml  Output      0 ml  Net    480 ml    Exam  General: Well developed, well nourished, NAD, appears stated age  HEENT: NCAT, PERRLA, EOMI, Anicteic Sclera, mucous membranes moist.   Neck: Supple, no JVD, no masses  Cardiovascular: S1 S2 auscultated, no rubs, murmurs or gallops. Regular rate and  rhythm.  Respiratory: Clear to auscultation bilaterally with equal chest rise  Abdomen: Soft, obese, nontender, nondistended, + bowel sounds  Extremities: warm dry without cyanosis clubbing or edema  Neuro: AAOx3, cranial nerves grossly intact. Strength 5/5 in patient's upper and lower extremities bilaterally, mild slurred speech  Skin: Without rashes exudates or nodules  Psych: Normal affect and demeanor with intact judgement and insight  Data Review   Micro Results No results found for this or any previous visit (from the past 240 hour(s)).  Radiology Reports Ct Head Wo Contrast  07/21/2013   CLINICAL DATA:  Slurred speech, left facial tightness, headache  EXAM: CT HEAD WITHOUT CONTRAST  TECHNIQUE: Contiguous axial images were obtained from the base of the skull through the vertex without intravenous contrast.  COMPARISON:  None.  FINDINGS: No evidence of parenchymal hemorrhage or extra-axial fluid collection. No mass lesion, mass effect, or midline shift.  No CT evidence of acute infarction.  Subcortical white matter and periventricular small vessel ischemic changes. Intracranial atherosclerosis.  Cerebral volume is within normal limits.  No ventriculomegaly.  Near complete opacification of the left maxillary sinus. Mastoid air cells are clear.  No evidence of calvarial fracture.  IMPRESSION: No evidence of acute intracranial abnormality.  Small vessel ischemic changes with intracranial atherosclerosis.   Electronically Signed   By: Julian Hy M.D.   On: 07/21/2013 13:22   Mr Jodene Nam Head Wo Contrast  07/22/2013   CLINICAL DATA:  slurred speech slurred speech; ?CVA. Left-sided facial droop. Slurred speech.  EXAM: MRI HEAD WITHOUT CONTRAST  MRA HEAD WITHOUT CONTRAST  TECHNIQUE: Multiplanar, multiecho pulse sequences of the brain and surrounding structures were obtained without intravenous contrast. Angiographic images of the head were obtained using MRA technique without contrast.   COMPARISON:  Head CT 07/21/2013  FINDINGS: MRI HEAD FINDINGS  There is a 1 cm region of restricted diffusion affecting the corpus callosum in the central portion discs the left of midline. There is low level restricted diffusion within a few scattered deep white matter foci. The cerebellum is normal. There are a few minor small vessel ischemic type foci within the pons. The cerebral hemispheres show an old punctate insult in the left thalamus and numerous abnormal white matter foci primarily within the deep white matter but with some subcortical white matter involvement. No cortical or large vessel territory infarction. The differential diagnosis in this case is that of multiple sclerosis versus is deep brain small vessel infarctions either due to atherosclerosis or vasculitis. No mass lesion, hemorrhage, hydrocephalus or extra-axial collection.  No pituitary mass. There is a retention cyst filling the majority of the left maxillary sinus. No skull or skullbase lesion.  MRA HEAD FINDINGS  Both internal carotid arteries are patent into the brain. There is atherosclerotic narrowing and irregularity in both carotid siphon regions but no flow-limiting stenosis. There is a 3 mm aneurysm at the left posterior communicating artery origin. There is an infundibulum on the right. Anterior and middle cerebral vessels are patent without proximal stenosis, aneurysm or vascular malformation. Both vertebral arteries are patent with the right being dominant. No basilar stenosis. Posterior circulation branch vessels are patent. Distal vessels show mild irregularity.  IMPRESSION: Multifocal cerebral hemispheric white matter lesions including a 1 cm region of restricted diffusion within the mid portion of the corpus callosum to the left of midline and a few areas of lower level restricted diffusion in the white matter. The differential diagnosis is that of multiple sclerosis versus multiple white matter infarctions due to  atherosclerosis or vasculitis.  MR angiography does not show any major vessel occlusion. There is considerable irregularity in the carotid siphon regions and some irregularity of the more distal intracranial branch vessels that go along with the presence of vascular pathology.  3 mm left PCOM aneurysm.   Electronically Signed   By: Nelson Chimes M.D.   On: 07/22/2013 08:50   Mr Brain Wo Contrast  07/22/2013   CLINICAL DATA:  slurred speech slurred speech; ?CVA. Left-sided facial droop. Slurred speech.  EXAM: MRI HEAD WITHOUT CONTRAST  MRA HEAD WITHOUT CONTRAST  TECHNIQUE: Multiplanar, multiecho pulse sequences of the brain and surrounding structures were obtained without intravenous contrast. Angiographic images of the head were obtained using MRA technique without contrast.  COMPARISON:  Head CT 07/21/2013  FINDINGS: MRI HEAD FINDINGS  There is a 1 cm region of restricted diffusion affecting the corpus callosum in the central portion discs the left of midline. There is low level restricted diffusion within a few scattered deep white matter foci. The cerebellum is normal. There are a few minor small vessel ischemic type foci within the pons. The cerebral hemispheres show an old punctate insult in the left thalamus and numerous abnormal white matter foci primarily within the deep white matter but with some subcortical white matter involvement. No cortical or large vessel territory infarction. The differential diagnosis in this case is that of multiple sclerosis versus is deep brain small vessel infarctions either due to atherosclerosis or vasculitis. No mass lesion, hemorrhage, hydrocephalus or extra-axial collection. No pituitary mass. There is a retention cyst filling the majority of the left maxillary sinus. No skull or skullbase lesion.  MRA HEAD FINDINGS  Both internal carotid arteries are patent into the brain. There is atherosclerotic narrowing and irregularity in both carotid siphon regions but no  flow-limiting stenosis. There is a 3 mm aneurysm at the left posterior communicating artery origin. There is an infundibulum on the right. Anterior and middle cerebral vessels are patent without proximal stenosis, aneurysm or vascular malformation. Both vertebral arteries are patent with the right being dominant. No basilar stenosis. Posterior circulation branch vessels are patent. Distal vessels show mild irregularity.  IMPRESSION: Multifocal cerebral hemispheric white matter lesions including a 1 cm region of restricted diffusion within the mid portion of the corpus callosum to the left of midline and a few areas of lower level restricted diffusion in the white matter. The differential diagnosis is that of multiple sclerosis versus multiple white matter infarctions due to atherosclerosis or vasculitis.  MR angiography does  not show any major vessel occlusion. There is considerable irregularity in the carotid siphon regions and some irregularity of the more distal intracranial branch vessels that go along with the presence of vascular pathology.  3 mm left PCOM aneurysm.   Electronically Signed   By: Nelson Chimes M.D.   On: 07/22/2013 08:50   Dg Knee Complete 4 Views Left  07/07/2013   CLINICAL DATA:  Anterior knee pain status post fall  EXAM: LEFT KNEE - COMPLETE 4+ VIEW  COMPARISON:  None.  FINDINGS: There is soft tissue swelling in the prepatellar region. There is no definite joint effusion. The patella is intact. The distal femur, proximal tibia, and proximal fibula are intact. The joint spaces are preserved.  IMPRESSION: There is soft tissue swelling anteriorly over the patella. There is no acute fracture nor dislocation.   Electronically Signed   By: David  Martinique   On: 07/07/2013 07:51    CBC  Recent Labs Lab 07/21/13 1233 07/21/13 1616 07/22/13 0701  WBC 5.6 6.7 6.0  HGB 15.0 14.2 13.7  HCT 44.6 41.6 41.7  PLT 192 208 209  MCV 94.5 93.3 94.1  MCH 31.8 31.8 30.9  MCHC 33.6 34.1 32.9  RDW  12.7 12.7 12.8  LYMPHSABS 1.7  --   --   MONOABS 0.3  --   --   EOSABS 0.1  --   --   BASOSABS 0.0  --   --     Chemistries   Recent Labs Lab 07/21/13 1233 07/21/13 1616 07/22/13 0701  NA 135*  --  138  K 4.2  --  4.4  CL 98  --  99  CO2 20  --  24  GLUCOSE 193*  --  199*  BUN 15  --  10  CREATININE 1.02 0.71 0.53  CALCIUM 9.4  --  8.9  AST 23  --  29  ALT 37*  --  33  ALKPHOS 101  --  91  BILITOT 0.2*  --  0.3   ------------------------------------------------------------------------------------------------------------------ estimated creatinine clearance is 103.5 ml/min (by C-G formula based on Cr of 0.53). ------------------------------------------------------------------------------------------------------------------ No results found for this basename: HGBA1C,  in the last 72 hours ------------------------------------------------------------------------------------------------------------------  Recent Labs  07/22/13 0701  CHOL 256*  HDL 31*  LDLCALC UNABLE TO CALCULATE IF TRIGLYCERIDE OVER 400 mg/dL  TRIG 554*  CHOLHDL 8.3   ------------------------------------------------------------------------------------------------------------------ No results found for this basename: TSH, T4TOTAL, FREET3, T3FREE, THYROIDAB,  in the last 72 hours ------------------------------------------------------------------------------------------------------------------ No results found for this basename: VITAMINB12, FOLATE, FERRITIN, TIBC, IRON, RETICCTPCT,  in the last 72 hours  Coagulation profile  Recent Labs Lab 07/21/13 1233  INR 0.97    No results found for this basename: DDIMER,  in the last 72 hours  Cardiac Enzymes No results found for this basename: CK, CKMB, TROPONINI, MYOGLOBIN,  in the last 168 hours ------------------------------------------------------------------------------------------------------------------ No components found with this basename: POCBNP,      Jewelianna Pancoast D.O. on 07/22/2013 at 1:00 PM  Between 7am to 7pm - Pager - [phone]  After 7pm go to [email or web address] - password [email or web address]  And look for the night coverage person covering for me after hours  [facility] Office  [phone]

## 2013-07-23 DIAGNOSIS — IMO0001 Reserved for inherently not codable concepts without codable children: Secondary | ICD-10-CM

## 2013-07-23 DIAGNOSIS — E1165 Type 2 diabetes mellitus with hyperglycemia: Secondary | ICD-10-CM

## 2013-07-23 DIAGNOSIS — E785 Hyperlipidemia, unspecified: Secondary | ICD-10-CM

## 2013-07-23 DIAGNOSIS — I10 Essential (primary) hypertension: Secondary | ICD-10-CM

## 2013-07-23 LAB — BASIC METABOLIC PANEL
ANION GAP: 16 — AB (ref 5–15)
BUN: 10 mg/dL (ref 6–23)
CHLORIDE: 101 meq/L (ref 96–112)
CO2: 23 mEq/L (ref 19–32)
Calcium: 9.2 mg/dL (ref 8.4–10.5)
Creatinine, Ser: 0.53 mg/dL (ref 0.50–1.10)
GFR calc non Af Amer: >90 mL/min (ref >90)
Glucose, Bld: 174 mg/dL — ABNORMAL HIGH (ref 70–99)
POTASSIUM: 4.4 meq/L (ref 3.7–5.3)
Sodium: 140 mEq/L (ref 137–147)

## 2013-07-23 LAB — CBC
HCT: 41.7 % (ref 36.0–46.0)
Hemoglobin: 14.1 g/dL (ref 12.0–15.0)
MCH: 31.8 pg (ref 26.0–34.0)
MCHC: 33.8 g/dL (ref 30.0–36.0)
MCV: 94.1 fL (ref 78.0–100.0)
Platelets: 208 10*3/uL (ref 150–400)
RBC: 4.43 MIL/uL (ref 3.87–5.11)
RDW: 12.8 % (ref 11.5–15.5)
WBC: 5.2 10*3/uL (ref 4.0–10.5)

## 2013-07-23 LAB — GLUCOSE, CAPILLARY
Glucose-Capillary: 190 mg/dL — ABNORMAL HIGH (ref 70–99)
Glucose-Capillary: 186 mg/dL — ABNORMAL HIGH (ref 70–99)

## 2013-07-23 MED ORDER — THIAMINE HCL 100 MG PO TABS: 100.0000 mg | ORAL_TABLET | Freq: Every day | ORAL | Status: DC

## 2013-07-23 MED ORDER — FOLIC ACID 1 MG PO TABS: 1.0000 mg | ORAL_TABLET | Freq: Every day | ORAL | Status: DC

## 2013-07-23 MED ORDER — METFORMIN HCL 1000 MG PO TABS: 1000.0000 mg | ORAL_TABLET | Freq: Two times a day (BID) | ORAL | Status: DC

## 2013-07-23 MED ORDER — LISINOPRIL-HYDROCHLOROTHIAZIDE 20-12.5 MG PO TABS: 1.0000 | ORAL_TABLET | Freq: Every day | ORAL | Status: DC

## 2013-07-23 MED ORDER — ADULT MULTIVITAMIN W/MINERALS CH: 1.0000 | ORAL_TABLET | Freq: Every day | ORAL | Status: DC

## 2013-07-23 MED ORDER — ATORVASTATIN CALCIUM 10 MG PO TABS
10.0000 mg | ORAL_TABLET | Freq: Every day | ORAL | Status: DC
Start: 2013-07-23 — End: 2013-08-05

## 2013-07-23 MED ORDER — ASPIRIN 325 MG PO TABS: 325.0000 mg | ORAL_TABLET | Freq: Every day | ORAL | Status: DC

## 2013-07-23 NOTE — Clinical Social Work Note (Signed)
CSW consulted. Pt to be discharged on 07/23/2013 with no PT follow-up. No CSW needs noted. CSW signing off.  Lubertha Sayres, MSW, [facility] Licensed Clinical Social Worker [phone] and [phone]

## 2013-07-23 NOTE — Discharge Summary (Signed)
Physician Discharge Summary  Shannon Price ZJI:[id number] DOB: 1960/12/09 DOA: 07/21/2013  PCP: Rubbie Battiest, MD  Admit date: 07/21/2013 Discharge date: 07/23/2013  Time spent: 45 minutes  Recommendations for Outpatient Follow-up:  Patient will be discharged to home. She will need followup with her primary care physician within one week after discharge. Patient should also follow up with Dr. Erlinda Hong, neurologist, within one month.  She is to continue taking her medications as prescribed. Patient was counseled against nicotine as well as alcohol use. Patient to continue physical activity as tolerated. This was discussed with patient as well as her husband.  Discharge Diagnoses:  Principal Problem:   CVA (cerebral infarction) Active Problems:   Slurred speech   Tobacco abuse   Alcohol dependence, daily use  Discharge Condition: Stable  Diet recommendation: Heart healthy/carb modified  Filed Weights   07/21/13 1210  Weight: 111.585 kg (246 lb)    History of present illness:  on 07/21/2013  Shannon Price is a 53 y.o. female with a history of tobacco abuse and daily alcohol intake, presented to the ED with Left sided facial droop and slurred speech since 7/12. Symptoms did not improve and the patient subsequently presented to the ED. In the ED, CT head was unemarkable. Neurology was consulted with recommendations for a CVA work up, including MRI. The hosptialist service was consulted for consideration of admission.  Hospital Course:  Acute CVA  -CT head negative for acute intracranial abnormality  -MRI/MRA of the brain: 1 cm region of restricted diffusion affecting the corpus callosum to the left of midline, 43mm left PCOM aneurysm, irregularity in the carotid siphon regions and the more distal intracranial branch vessels  -Neurology consulted and appreciated  -PT and OT were consulted and did not recommend any outpatient followup -Echocardiogram: EF 50-55%, no source of emboli -Carotid  Doppler:Bilateral - 1% to 39% ICA stenosis. Vertebral artery flow is antegrade. -Lipid panel: Total cholesterol 256, triglycerides 54, HDL 31, LDL unable to calculate  -Continue Aspirin 325mg  daily and statin. -Patient will need to follow up with Dr. Erlinda Hong within one month.  Diabetes mellitus  -Patient on no medication  -Hemoglobin A1c  7.6 -Was placed on insulin sliding scale with CBG monitoring during hospitalization -Discussed medications with patient, patient stated she was on metformin 1000 mg twice daily however do to loss of insurance, did not have refills on her medications. -Patient will be discharged with metformin 1000 mg twice a day -She is to follow up with her primary care physician within 3 months for hemoglobin A1c.  Tobacco Abuse  -Smoking cessation counseling given   Alcohol Abuse  -Alcohol abuse counseling given  -Patient did not have withdrawal symptoms.  Hyperlipidemia  -Continue statin -Lipid panel as above -Patient will need to follow up with her primary care physician regarding her lipid profile within 3-6 months. At that time her statin may need to have a dose adjustment.  Hypertension -Allow for permissive hypertension due to stroke -Patient states that she was on lisinopril/HCTZ however ran out of this medication was unable to obtain a we will do to loss of insurance. -Patient will be discharged with lisinopril/HCTZ.  Procedures: 07/22/13 Carotid Doppler:Bilateral - 1% to 39% ICA stenosis. Vertebral artery flow is antegrade.  07/22/13 Echocardiogram Study Conclusions Left ventricle: The cavity size was normal. Wall thickness was normal. Systolic function was normal. The estimated ejection fraction was in the range of 50% to 55%. Wall motion was normal; there were no regional wall motion abnormalities.  Consultations: Neurology  Discharge Exam: Filed Vitals:   07/23/13 1218  BP: 165/99  Pulse: 91  Temp: 98.3 F (36.8 C)  Resp: 18   Exam  General:  Well developed, well nourished, NAD, appears stated age  HEENT: NCAT, mucous membranes moist.  Neck: Supple, no JVD, no masses  Cardiovascular: S1 S2 auscultated, regular rate and rhythm.  Respiratory: Clear to auscultation bilaterally with equal chest rise  Abdomen: Soft, obese, nontender, nondistended, + bowel sounds  Extremities: warm dry without cyanosis clubbing or edema  Neuro: AAOx3, mild slurred speech with delayed response- however, improving. Skin: Without rashes exudates or nodules  Psych: Normal affect and demeanor  Discharge Instructions      Discharge Instructions   Discharge instructions    Complete by:  As directed   Patient will be discharged to home. She will need followup with her primary care physician within one week after discharge. Patient should also follow up with Dr. Erlinda Hong, neurologist, within one month.  She is to continue taking her medications as prescribed. Patient was counseled against nicotine as well as alcohol use. Patient to continue physical activity as tolerated. This was discussed with patient as well as her husband. Patient to follow a heart healthy/carb modified diet.            Medication List    STOP taking these medications       BC HEADACHE POWDER PO      TAKE these medications       aspirin 325 MG tablet  Take 1 tablet (325 mg total) by mouth daily.     atorvastatin 10 MG tablet  Commonly known as:  LIPITOR  Take 1 tablet (10 mg total) by mouth daily at 6 PM.     folic acid 1 MG tablet  Commonly known as:  FOLVITE  Take 1 tablet (1 mg total) by mouth daily.     lisinopril-hydrochlorothiazide 20-12.5 MG per tablet  Commonly known as:  PRINZIDE  Take 1 tablet by mouth daily.     metFORMIN 1000 MG tablet  Commonly known as:  GLUCOPHAGE  Take 1 tablet (1,000 mg total) by mouth 2 (two) times daily with a meal.     multivitamin with minerals Tabs tablet  Take 1 tablet by mouth daily.     thiamine 100 MG tablet  Take 1 tablet  (100 mg total) by mouth daily.       No Known Allergies Follow-up Information   Follow up with Rubbie Battiest, MD. Schedule an appointment as soon as possible for a visit in 1 week. [facility] followup, Hypertension and diabetes management)    Specialty:  Family Medicine   Contact information:   [address] [place] [address] [phone]       Follow up with Xu,Jindong, MD. Schedule an appointment as soon as possible for a visit in 2 months. [facility] followup, Stroke Clinic)    Specialty:  Neurology   Contact information:   [address] [place] [address] [phone]        The results of significant diagnostics from this hospitalization (including imaging, microbiology, ancillary and laboratory) are listed below for reference.    Significant Diagnostic Studies: Ct Head Wo Contrast  07/21/2013   CLINICAL DATA:  Slurred speech, left facial tightness, headache  EXAM: CT HEAD WITHOUT CONTRAST  TECHNIQUE: Contiguous axial images were obtained from the base of the skull through the vertex without intravenous contrast.  COMPARISON:  None.  FINDINGS:  No evidence of parenchymal hemorrhage or extra-axial fluid collection. No mass lesion, mass effect, or midline shift.  No CT evidence of acute infarction.  Subcortical white matter and periventricular small vessel ischemic changes. Intracranial atherosclerosis.  Cerebral volume is within normal limits.  No ventriculomegaly.  Near complete opacification of the left maxillary sinus. Mastoid air cells are clear.  No evidence of calvarial fracture.  IMPRESSION: No evidence of acute intracranial abnormality.  Small vessel ischemic changes with intracranial atherosclerosis.   Electronically Signed   By: Julian Hy M.D.   On: 07/21/2013 13:22   Mr Jodene Nam Head Wo Contrast  07/22/2013   CLINICAL DATA:  slurred speech slurred speech; ?CVA. Left-sided facial droop. Slurred speech.  EXAM: MRI HEAD WITHOUT CONTRAST  MRA  HEAD WITHOUT CONTRAST  TECHNIQUE: Multiplanar, multiecho pulse sequences of the brain and surrounding structures were obtained without intravenous contrast. Angiographic images of the head were obtained using MRA technique without contrast.  COMPARISON:  Head CT 07/21/2013  FINDINGS: MRI HEAD FINDINGS  There is a 1 cm region of restricted diffusion affecting the corpus callosum in the central portion discs the left of midline. There is low level restricted diffusion within a few scattered deep white matter foci. The cerebellum is normal. There are a few minor small vessel ischemic type foci within the pons. The cerebral hemispheres show an old punctate insult in the left thalamus and numerous abnormal white matter foci primarily within the deep white matter but with some subcortical white matter involvement. No cortical or large vessel territory infarction. The differential diagnosis in this case is that of multiple sclerosis versus is deep brain small vessel infarctions either due to atherosclerosis or vasculitis. No mass lesion, hemorrhage, hydrocephalus or extra-axial collection. No pituitary mass. There is a retention cyst filling the majority of the left maxillary sinus. No skull or skullbase lesion.  MRA HEAD FINDINGS  Both internal carotid arteries are patent into the brain. There is atherosclerotic narrowing and irregularity in both carotid siphon regions but no flow-limiting stenosis. There is a 3 mm aneurysm at the left posterior communicating artery origin. There is an infundibulum on the right. Anterior and middle cerebral vessels are patent without proximal stenosis, aneurysm or vascular malformation. Both vertebral arteries are patent with the right being dominant. No basilar stenosis. Posterior circulation branch vessels are patent. Distal vessels show mild irregularity.  IMPRESSION: Multifocal cerebral hemispheric white matter lesions including a 1 cm region of restricted diffusion within the mid  portion of the corpus callosum to the left of midline and a few areas of lower level restricted diffusion in the white matter. The differential diagnosis is that of multiple sclerosis versus multiple white matter infarctions due to atherosclerosis or vasculitis.  MR angiography does not show any major vessel occlusion. There is considerable irregularity in the carotid siphon regions and some irregularity of the more distal intracranial branch vessels that go along with the presence of vascular pathology.  3 mm left PCOM aneurysm.   Electronically Signed   By: Nelson Chimes M.D.   On: 07/22/2013 08:50   Mr Brain Wo Contrast  07/22/2013   CLINICAL DATA:  slurred speech slurred speech; ?CVA. Left-sided facial droop. Slurred speech.  EXAM: MRI HEAD WITHOUT CONTRAST  MRA HEAD WITHOUT CONTRAST  TECHNIQUE: Multiplanar, multiecho pulse sequences of the brain and surrounding structures were obtained without intravenous contrast. Angiographic images of the head were obtained using MRA technique without contrast.  COMPARISON:  Head CT 07/21/2013  FINDINGS: MRI HEAD  FINDINGS  There is a 1 cm region of restricted diffusion affecting the corpus callosum in the central portion discs the left of midline. There is low level restricted diffusion within a few scattered deep white matter foci. The cerebellum is normal. There are a few minor small vessel ischemic type foci within the pons. The cerebral hemispheres show an old punctate insult in the left thalamus and numerous abnormal white matter foci primarily within the deep white matter but with some subcortical white matter involvement. No cortical or large vessel territory infarction. The differential diagnosis in this case is that of multiple sclerosis versus is deep brain small vessel infarctions either due to atherosclerosis or vasculitis. No mass lesion, hemorrhage, hydrocephalus or extra-axial collection. No pituitary mass. There is a retention cyst filling the majority of  the left maxillary sinus. No skull or skullbase lesion.  MRA HEAD FINDINGS  Both internal carotid arteries are patent into the brain. There is atherosclerotic narrowing and irregularity in both carotid siphon regions but no flow-limiting stenosis. There is a 3 mm aneurysm at the left posterior communicating artery origin. There is an infundibulum on the right. Anterior and middle cerebral vessels are patent without proximal stenosis, aneurysm or vascular malformation. Both vertebral arteries are patent with the right being dominant. No basilar stenosis. Posterior circulation branch vessels are patent. Distal vessels show mild irregularity.  IMPRESSION: Multifocal cerebral hemispheric white matter lesions including a 1 cm region of restricted diffusion within the mid portion of the corpus callosum to the left of midline and a few areas of lower level restricted diffusion in the white matter. The differential diagnosis is that of multiple sclerosis versus multiple white matter infarctions due to atherosclerosis or vasculitis.  MR angiography does not show any major vessel occlusion. There is considerable irregularity in the carotid siphon regions and some irregularity of the more distal intracranial branch vessels that go along with the presence of vascular pathology.  3 mm left PCOM aneurysm.   Electronically Signed   By: Nelson Chimes M.D.   On: 07/22/2013 08:50   Dg Knee Complete 4 Views Left  07/07/2013   CLINICAL DATA:  Anterior knee pain status post fall  EXAM: LEFT KNEE - COMPLETE 4+ VIEW  COMPARISON:  None.  FINDINGS: There is soft tissue swelling in the prepatellar region. There is no definite joint effusion. The patella is intact. The distal femur, proximal tibia, and proximal fibula are intact. The joint spaces are preserved.  IMPRESSION: There is soft tissue swelling anteriorly over the patella. There is no acute fracture nor dislocation.   Electronically Signed   By: David  Martinique   On: 07/07/2013 07:51     Microbiology: No results found for this or any previous visit (from the past 240 hour(s)).   Labs: Basic Metabolic Panel:  Recent Labs Lab 07/21/13 1233 07/21/13 1616 07/22/13 0701 07/23/13 0416  NA 135*  --  138 140  K 4.2  --  4.4 4.4  CL 98  --  99 101  CO2 20  --  24 23  GLUCOSE 193*  --  199* 174*  BUN 15  --  10 10  CREATININE 1.02 0.71 0.53 0.53  CALCIUM 9.4  --  8.9 9.2   Liver Function Tests:  Recent Labs Lab 07/21/13 1233 07/22/13 0701  AST 23 29  ALT 37* 33  ALKPHOS 101 91  BILITOT 0.2* 0.3  PROT 7.0 6.8  ALBUMIN 3.5 3.4*   No results found for this basename:  LIPASE, AMYLASE,  in the last 168 hours No results found for this basename: AMMONIA,  in the last 168 hours CBC:  Recent Labs Lab 07/21/13 1233 07/21/13 1616 07/22/13 0701 07/23/13 0416  WBC 5.6 6.7 6.0 5.2  NEUTROABS 3.4  --   --   --   HGB 15.0 14.2 13.7 14.1  HCT 44.6 41.6 41.7 41.7  MCV 94.5 93.3 94.1 94.1  PLT 192 208 209 208   Cardiac Enzymes: No results found for this basename: CKTOTAL, CKMB, CKMBINDEX, TROPONINI,  in the last 168 hours BNP: BNP (last 3 results) No results found for this basename: PROBNP,  in the last 8760 hours CBG:  Recent Labs Lab 07/22/13 0711 07/22/13 1157 07/22/13 1701 07/22/13 2134 07/23/13 0703  GLUCAP 250* 252* 165* 194* 190*       Signed:  Latrise Bowland  [facility] 07/23/2013, 12:17 PM

## 2013-07-23 NOTE — Progress Notes (Signed)
Pt A&O x4; pt discharge education and instructions completed with pt and spouse at bedside. Both voices understanding and denies any questions. All lines including IV and telemetry removed from pt. Pt provided educational handout on smoking cessation and stroke. Pt to pick up ordered prescription from preferred pharmacy on file. Pt ambulated off unit with spouse and belongings at side. Pt discharge home and spouse to transport pt off to disposition. Francis Gaines Trivia Heffelfinger RN.

## 2013-07-23 NOTE — Progress Notes (Signed)
Stroke Team Progress Note  HISTORY Chief Complaint: Slurred speech  HPI:  Shannon Price is an right handed 53 y.o. female who has not been on her medications for both DM and HTN for the past year. In additon she smokes 3 PKS per day of cigarettes. Two days ago she noted a sudden onset ofspeech difficulty("I sound like a retard") and a sensation of left face "pulling". This has persisted, for two days. Currently she has a mild 3/10 HA on the right parietal region of her head, described as constant and started after the CT head while in ED. She denies any blurred vision, numbness or tingling in extremities.  Date last known well: Date: 07/19/2013  Time last known well: Unable to determine  tPA Given: No: out of window  Patient was not administered TPA secondary to unknown last known well. She was admitted to the Internal Medicine Service for further evaluation and treatment. Neurology is consulting.   SUBJECTIVE Patient up in bed, eating, feels her speech is improving but not resolved.  OBJECTIVE Most recent Vital Signs: Filed Vitals:   07/22/13 2132 07/23/13 0101 07/23/13 0527 07/23/13 0951  BP: 185/88 156/74 170/87 191/116  Pulse: 86 78 90 91  Temp: 98 F (36.7 C) 97.8 F (36.6 C) 97.8 F (36.6 C) 98.3 F (36.8 C)  TempSrc: Oral Oral Oral Oral  Resp: 18 18  18   Height:      Weight:      SpO2: 100% 100% 98% 98%   CBG (last 3)   Recent Labs  07/22/13 1701 07/22/13 2134 07/23/13 0703  GLUCAP 165* 194* 190*    IV Fluid Intake:     MEDICATIONS  . aspirin  325 mg Oral Daily  . atorvastatin  10 mg Oral q1800  . folic acid  1 mg Oral Daily  . heparin  5,000 Units Subcutaneous 3 times per day  . insulin aspart  0-15 Units Subcutaneous TID WC  . insulin aspart  0-5 Units Subcutaneous QHS  . LORazepam  0-4 mg Intravenous Q6H   Followed by  . LORazepam  0-4 mg Intravenous Q12H  . multivitamin with minerals  1 tablet Oral Daily  . sodium chloride  3 mL Intravenous Q12H  .  thiamine  100 mg Oral Daily   PRN:  acetaminophen, acetaminophen, LORazepam, LORazepam, morphine injection, ondansetron (ZOFRAN) IV, ondansetron  Diet:  Carb Control thin liquids Activity:  Up with assistance DVT Prophylaxis:  SQ heparin  CLINICALLY SIGNIFICANT STUDIES Basic Metabolic Panel:   Recent Labs Lab 07/22/13 0701 07/23/13 0416  NA 138 140  K 4.4 4.4  CL 99 101  CO2 24 23  GLUCOSE 199* 174*  BUN 10 10  CREATININE 0.53 0.53  CALCIUM 8.9 9.2   Liver Function Tests:   Recent Labs Lab 07/21/13 1233 07/22/13 0701  AST 23 29  ALT 37* 33  ALKPHOS 101 91  BILITOT 0.2* 0.3  PROT 7.0 6.8  ALBUMIN 3.5 3.4*   CBC:  Recent Labs Lab 07/21/13 1233  07/22/13 0701 07/23/13 0416  WBC 5.6  < > 6.0 5.2  NEUTROABS 3.4  --   --   --   HGB 15.0  < > 13.7 14.1  HCT 44.6  < > 41.7 41.7  MCV 94.5  < > 94.1 94.1  PLT 192  < > 209 208  < > = values in this interval not displayed. Coagulation:   Recent Labs Lab 07/21/13 1233  LABPROT 12.9  INR 0.97  Cardiac Enzymes: No results found for this basename: CKTOTAL, CKMB, CKMBINDEX, TROPONINI,  in the last 168 hours Urinalysis: No results found for this basename: COLORURINE, APPERANCEUR, LABSPEC, PHURINE, GLUCOSEU, HGBUR, BILIRUBINUR, KETONESUR, PROTEINUR, UROBILINOGEN, NITRITE, LEUKOCYTESUR,  in the last 168 hours Lipid Panel    Component Value Date/Time   CHOL 256* 07/22/2013 0701   TRIG 554* 07/22/2013 0701   HDL 31* 07/22/2013 0701   CHOLHDL 8.3 07/22/2013 0701   VLDL UNABLE TO CALCULATE IF TRIGLYCERIDE OVER 400 mg/dL 07/22/2013 0701   LDLCALC UNABLE TO CALCULATE IF TRIGLYCERIDE OVER 400 mg/dL 07/22/2013 0701   HgbA1C  Lab Results  Component Value Date   HGBA1C 7.6* 07/22/2013    Urine Drug Screen:     Component Value Date/Time   LABOPIA NONE DETECTED 07/21/2013 2223   COCAINSCRNUR NONE DETECTED 07/21/2013 2223   LABBENZ NONE DETECTED 07/21/2013 2223   AMPHETMU NONE DETECTED 07/21/2013 2223   THCU NONE DETECTED  07/21/2013 2223   LABBARB NONE DETECTED 07/21/2013 2223    Alcohol Level:   Recent Labs Lab 07/21/13 1616  ETH <11    Ct Head Wo Contrast  07/21/2013 IMPRESSION: No evidence of acute intracranial abnormality.  Small vessel ischemic changes with intracranial atherosclerosis.     MRI/A Brain Wo Contrast  07/22/2013 IMPRESSION: Multifocal cerebral hemispheric white matter lesions including a 1 cm region of restricted diffusion within the mid portion of the corpus callosum to the left of midline and a few areas of lower level restricted diffusion in the white matter. The differential diagnosis is that of multiple sclerosis versus multiple white matter infarctions due to atherosclerosis or vasculitis.  MR angiography does not show any major vessel occlusion. There is considerable irregularity in the carotid siphon regions and some irregularity of the more distal intracranial branch vessels that go along with the presence of vascular pathology.  3 mm left PCOM aneurysm.     Carotid Doppler Prelim <39% stenosis bilat ICAs  2D Echocardiogram EF 50-55%, no definite source of emboli identified  EKG  Sinus tachycardia. For complete results please see formal report.   Therapy Recommendations PT recs no follow up needed  Physical Exam Blood pressure 191/116, pulse 91, temperature 98.3 F (36.8 C), temperature source Oral, resp. rate 18, height 5\' 5"  (1.651 m), weight 111.585 kg (246 lb), SpO2 98.00%. Gen: Patient is well developed, well nourished elderly woman in no acute distress.  Cardiac: RRR. S1S2 audible. No M/R/G.  Extremities: Cap refill <2 secs. No cyanosis or edema. Pulses 2+ radial and DP. Pulmonary: Respirations regular, symmetric. Lungs clear to auscultation bilat. Abd: Soft, non-tender. BS audible x 4 quadrants.  G/U: Deferred  MS: Alert, follows commands. Oriented to person, place, time, and event. Speech: Speech dysfluent and  Mildly with expressive difficultiesdysarthric. Able to  name and repeat. No alexia or agraphia.   CN: No visual field cut. PERRL. EOMs intact. Facial sensation intact V1-3. No facial droop. Hearing grossly intact. Strong cough. Sternocleidomastoids and trapezius 5/5 strength. Tongue midline, full strength, no atrophy or fasciculations.  Strength: 5/5 in all four extremities proximally and distally.  Sensation: Intact to light touch in all four extremities.  Coordination: No ataxia or dysmetria on FTN or HTS bilat. Gait steady.  Proprioception: Negative Romberg.   Reflexes: 2+ biceps, brachioradialis bilat. 2+ patellar, achilles bilat. No clonus. Downgoing toes bilat.  NIHSS 0  ASSESSMENT/PLAN Shannon Price is a 53 y.o. female presenting with speech difficulty and clumsiness.  IV t-PA not given due to  unknown last known well. Imaging confirms infarct in corpus callosum in distribution of MCA/ACA perforators. Infarct felt to be secondary to small vessel ischemic disease.  Not on  Antithrombotic medication prior to admission. Now on aspirin 325 mg orally every day for secondary stroke prevention. Patient with resultant mild expressive speech difficulties which is improving . Stroke work up completed.  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  Patient has a 10-15% risk of having another stroke over the next year, the highest risk is within 2 weeks of the most recent stroke/TIA (risk of having a stroke following a stroke or TIA is the same).  Ongoing risk factor  control by Primary Care Physician  Stroke Service will sign off. Please call should any needs arise.  Follow up with Dr. Erlinda Hong, [facility], in 2 months.    Hospital day # 2   SIGNED Burnetta Sabin, MSN, RN, ANVP-BC, ANP-BC, [facility] Pager: [phone] 07/23/2013 12:14 PM   I, the attending vascular neurologist, have personally obtained a history, examined the patient, evaluated laboratory data and imaging studies, and formulated the assessment and plan of care.  I have made any additions or clarifications directly to the above note and agree with the findings and plan as currently documented.  Antony Contras, MD   To contact Stroke Continuity provider, please refer to [email or web address] After hours, contact General Neurology

## 2013-08-05 ENCOUNTER — Ambulatory Visit (INDEPENDENT_AMBULATORY_CARE_PROVIDER_SITE_OTHER): Payer: 59 | Admitting: Family Medicine

## 2013-08-05 ENCOUNTER — Encounter: Payer: Self-pay | Admitting: Family Medicine

## 2013-08-05 VITALS — BP 146/94 | Ht 65.0 in | Wt 247.0 lb

## 2013-08-05 DIAGNOSIS — I6381 Other cerebral infarction due to occlusion or stenosis of small artery: Secondary | ICD-10-CM

## 2013-08-05 DIAGNOSIS — E785 Hyperlipidemia, unspecified: Secondary | ICD-10-CM | POA: Insufficient documentation

## 2013-08-05 DIAGNOSIS — I10 Essential (primary) hypertension: Secondary | ICD-10-CM

## 2013-08-05 DIAGNOSIS — E119 Type 2 diabetes mellitus without complications: Secondary | ICD-10-CM | POA: Insufficient documentation

## 2013-08-05 DIAGNOSIS — I6350 Cerebral infarction due to unspecified occlusion or stenosis of unspecified cerebral artery: Secondary | ICD-10-CM

## 2013-08-05 MED ORDER — BUPROPION HCL ER (SR) 150 MG PO TB12: 150.0000 mg | ORAL_TABLET | Freq: Two times a day (BID) | ORAL | Status: DC

## 2013-08-05 MED ORDER — LISINOPRIL-HYDROCHLOROTHIAZIDE 20-12.5 MG PO TABS: 1.0000 | ORAL_TABLET | Freq: Every day | ORAL | Status: DC

## 2013-08-05 MED ORDER — FOLIC ACID 1 MG PO TABS: 1.0000 mg | ORAL_TABLET | Freq: Every day | ORAL | Status: DC

## 2013-08-05 MED ORDER — ATORVASTATIN CALCIUM 10 MG PO TABS: 10.0000 mg | ORAL_TABLET | Freq: Every day | ORAL | Status: DC

## 2013-08-05 MED ORDER — METFORMIN HCL 1000 MG PO TABS: 1000.0000 mg | ORAL_TABLET | Freq: Two times a day (BID) | ORAL | Status: DC

## 2013-08-05 NOTE — Progress Notes (Signed)
   Subjective:    Patient ID: Shannon Price, female    DOB: Jun 09, 1960, 53 y.o.   MRN: [id number]  Diabetes She presents for her follow-up diabetic visit. She has type 2 diabetes mellitus. She is following a generally unhealthy diet. She rarely participates in exercise. She does not see a podiatrist.Eye exam is not current.  Follow up hospital stay for stroke on July 14th. Patient just started back on medications. Was off of meds because she had no insurance. Patient is not testing bloodsugar. A1C was done in hospital. 7.6 on July 15th.  Patient states she does not know what she should be eating. Info on dietian at hospital given to pt to schedule appt.   More emotional and feeling deprexsed at times  Gets upset easily  Not cking sugars regularly  Ran out of the machine   Discuss quit smoking. Unfortunately has returned to smoking.  Patient experienced a stroke. Salt a neurologist. Felt to be due to central stroke. Have left-sided weakness which has improved. Had slurred speech which has improved considerably. Still occurs slightly on occasion.  Patient reports still periods of transient "brain freeze".  Patient came off all her medications after losing insurance.  Claims compliance with new blood pressure medicine. No obvious side effects.  No suicidal or homicidal thoughts   Review of Systems No chest pain no back pain no abdominal pain no change in bowel habits no blood in stool    Objective:   Physical Exam  Alert obesity present. Blood pressure good control. HEENT slight weakness left side of face. Slight slurring of speech. No central a fascia noted neck supple negative bruits lungs clear. Heart regular rate rhythm. Ankle without edema. Left-sided strength grossly intact.      Assessment & Plan:  Impression 1 status post stroke discussed at great length. Physical therapist evaluated in the hospital and felt she did not need outpatient management. #2 type 2 diabetes with  noncompliance discussed #3 hypertension control improved. #4 hyperlipidemia exact status uncertain patient personally meds. #5 depression discussed. #6 smoking cessation discussed plan initiate Wellbutrin SR. Maintain blood pressure medicine and diabetes medicine and lipid medicine all discussed at length. Glucometer prescribed. Recheck in one month here. Diet exercise discussed in encourage. Encouraged to stop smoking. 40 minutes spent with patient most in discussion. WSL

## 2013-09-03 ENCOUNTER — Ambulatory Visit (INDEPENDENT_AMBULATORY_CARE_PROVIDER_SITE_OTHER): Payer: 59 | Admitting: Neurology

## 2013-09-03 ENCOUNTER — Encounter: Payer: Self-pay | Admitting: Neurology

## 2013-09-03 VITALS — BP 177/90 | HR 101 | Ht 65.0 in | Wt 253.4 lb

## 2013-09-03 DIAGNOSIS — I639 Cerebral infarction, unspecified: Secondary | ICD-10-CM

## 2013-09-03 DIAGNOSIS — E1165 Type 2 diabetes mellitus with hyperglycemia: Secondary | ICD-10-CM

## 2013-09-03 DIAGNOSIS — I6350 Cerebral infarction due to unspecified occlusion or stenosis of unspecified cerebral artery: Secondary | ICD-10-CM

## 2013-09-03 DIAGNOSIS — IMO0001 Reserved for inherently not codable concepts without codable children: Secondary | ICD-10-CM

## 2013-09-03 DIAGNOSIS — Z720 Tobacco use: Secondary | ICD-10-CM

## 2013-09-03 DIAGNOSIS — I10 Essential (primary) hypertension: Secondary | ICD-10-CM

## 2013-09-03 NOTE — Progress Notes (Signed)
STROKE NEUROLOGY FOLLOW UP NOTE  NAME: RILYN SCROGGS DOB: 04/05/1960  REASON FOR VISIT: stroke follow up HISTORY FROM: pt and chart  Today we had the pleasure of seeing LUMEN BRINLEE in follow-up at our Neurology Clinic. Pt was accompanied by no one.   History Summary BRENDALYN VALLELY is an right handed 53 y.o. female who has not been on her medications for both DM and HTN for the past year. In additon she smokes 3 PKS per day of cigarettes. She was admitted to [facility] on 07/21/13 due to sudden onset ofspeech difficulty("I sound like a retard") and a sensation of left face "pulling". 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 She was discharged on ASA and lipitor.  Interval History During the interval time, the patient has been doing better. She stated that her speech is improved much. Currently close to her baseline. She denies any residue weakness or numbness. However, she stated that she has a lot of memory difficulty after stroke. For example, she will go to grocery store and forgot to turn off the engine. She has several time for the last month forgetting to turn of the kitchen sink water to cause flooding in kitchen and she has several time to leave the stove on, even overnight. She saw PCP on 08/05/13 and changed regimen for DM control.   REVIEW OF SYSTEMS: Full 14 system review of systems performed and notable only for those listed below and in HPI above, all others are negative:   Excessive thirst, restless leg, insomnia, nervous, anxiety, memory loss, dizziness  The following represents the patient's updated allergies and side effects list: No Known Allergies  Labs since last visit of relevance include the following: Results for orders placed during the hospital encounter of 07/21/13  PROTIME-INR      Result Value Ref Range   Prothrombin Time 12.9  11.6 - 15.2 seconds   INR 0.97  0.00 - 1.49  APTT      Result Value Ref Range   aPTT 29  24 - 37 seconds  CBC      Result Value Ref Range   WBC 5.6  4.0 - 10.5 K/uL   RBC 4.72  3.87 - 5.11 MIL/uL   Hemoglobin 15.0  12.0 - 15.0 g/dL   HCT 44.6  36.0 - 46.0 %   MCV 94.5  78.0 - 100.0 fL   MCH 31.8  26.0 - 34.0 pg   MCHC 33.6  30.0 - 36.0 g/dL   RDW 12.7  11.5 - 15.5 %   Platelets 192  150 - 400 K/uL  DIFFERENTIAL      Result Value Ref Range   Neutrophils Relative % 61  43 - 77 %   Neutro Abs 3.4  1.7 - 7.7 K/uL   Lymphocytes Relative 31  12 - 46 %   Lymphs Abs 1.7  0.7 - 4.0 K/uL   Monocytes Relative 5  3 - 12 %   Monocytes Absolute 0.3  0.1 - 1.0 K/uL   Eosinophils Relative 2  0 - 5 %   Eosinophils Absolute 0.1  0.0 - 0.7 K/uL   Basophils Relative 1  0 - 1 %   Basophils Absolute 0.0  0.0 - 0.1 K/uL  COMPREHENSIVE METABOLIC PANEL      Result Value Ref Range   Sodium 135 (*) 137 - 147 mEq/L   Potassium 4.2  3.7 -  5.3 mEq/L   Chloride 98  96 - 112 mEq/L   CO2 20  19 - 32 mEq/L   Glucose, Bld 193 (*) 70 - 99 mg/dL   BUN 15  6 - 23 mg/dL   Creatinine, Ser 1.02  0.50 - 1.10 mg/dL   Calcium 9.4  8.4 - 10.5 mg/dL   Total Protein 7.0  6.0 - 8.3 g/dL   Albumin 3.5  3.5 - 5.2 g/dL   AST 23  0 - 37 U/L   ALT 37 (*) 0 - 35 U/L   Alkaline Phosphatase 101  39 - 117 U/L   Total Bilirubin 0.2 (*) 0.3 - 1.2 mg/dL   GFR calc non Af Amer 63 (*) >90 mL/min   GFR calc Af Amer 73 (*) >90 mL/min   Anion gap 17 (*) 5 - 15  CBC      Result Value Ref Range   WBC 6.7  4.0 - 10.5 K/uL   RBC 4.46  3.87 - 5.11 MIL/uL   Hemoglobin 14.2  12.0 - 15.0 g/dL   HCT 41.6  36.0 - 46.0 %   MCV 93.3  78.0 - 100.0 fL   MCH 31.8  26.0 - 34.0 pg   MCHC 34.1  30.0 - 36.0 g/dL   RDW 12.7  11.5 - 15.5 %   Platelets 208  150 - 400 K/uL  CREATININE, SERUM      Result Value Ref Range   Creatinine, Ser 0.71  0.50 - 1.10 mg/dL   GFR calc non Af Amer >90  >90 mL/min   GFR calc Af Amer >90  >90 mL/min  ETHANOL      Result Value Ref Range   Alcohol, Ethyl (B)  <11  0 - 11 mg/dL  COMPREHENSIVE METABOLIC PANEL      Result Value Ref Range   Sodium 138  137 - 147 mEq/L   Potassium 4.4  3.7 - 5.3 mEq/L   Chloride 99  96 - 112 mEq/L   CO2 24  19 - 32 mEq/L   Glucose, Bld 199 (*) 70 - 99 mg/dL   BUN 10  6 - 23 mg/dL   Creatinine, Ser 0.53  0.50 - 1.10 mg/dL   Calcium 8.9  8.4 - 10.5 mg/dL   Total Protein 6.8  6.0 - 8.3 g/dL   Albumin 3.4 (*) 3.5 - 5.2 g/dL   AST 29  0 - 37 U/L   ALT 33  0 - 35 U/L   Alkaline Phosphatase 91  39 - 117 U/L   Total Bilirubin 0.3  0.3 - 1.2 mg/dL   GFR calc non Af Amer >90  >90 mL/min   GFR calc Af Amer >90  >90 mL/min   Anion gap 15  5 - 15  CBC      Result Value Ref Range   WBC 6.0  4.0 - 10.5 K/uL   RBC 4.43  3.87 - 5.11 MIL/uL   Hemoglobin 13.7  12.0 - 15.0 g/dL   HCT 41.7  36.0 - 46.0 %   MCV 94.1  78.0 - 100.0 fL   MCH 30.9  26.0 - 34.0 pg   MCHC 32.9  30.0 - 36.0 g/dL   RDW 12.8  11.5 - 15.5 %   Platelets 209  150 - 400 K/uL  HEMOGLOBIN A1C      Result Value Ref Range   Hemoglobin A1C 7.6 (*) <5.7 %   Mean Plasma Glucose 171 (*) <117 mg/dL  GLUCOSE,  CAPILLARY      Result Value Ref Range   Glucose-Capillary 254 (*) 70 - 99 mg/dL  LIPID PANEL      Result Value Ref Range   Cholesterol 256 (*) 0 - 200 mg/dL   Triglycerides 554 (*) <150 mg/dL   HDL 31 (*) >39 mg/dL   Total CHOL/HDL Ratio 8.3     VLDL UNABLE TO CALCULATE IF TRIGLYCERIDE OVER 400 mg/dL  0 - 40 mg/dL   LDL Cholesterol UNABLE TO CALCULATE IF TRIGLYCERIDE OVER 400 mg/dL  0 - 99 mg/dL  GLUCOSE, CAPILLARY      Result Value Ref Range   Glucose-Capillary 229 (*) 70 - 99 mg/dL  URINE RAPID DRUG SCREEN (HOSP PERFORMED)      Result Value Ref Range   Opiates NONE DETECTED  NONE DETECTED   Cocaine NONE DETECTED  NONE DETECTED   Benzodiazepines NONE DETECTED  NONE DETECTED   Amphetamines NONE DETECTED  NONE DETECTED   Tetrahydrocannabinol NONE DETECTED  NONE DETECTED   Barbiturates NONE DETECTED  NONE DETECTED  GLUCOSE, CAPILLARY       Result Value Ref Range   Glucose-Capillary 250 (*) 70 - 99 mg/dL   Comment 1 Documented in Chart     Comment 2 Notify RN    GLUCOSE, CAPILLARY      Result Value Ref Range   Glucose-Capillary 252 (*) 70 - 99 mg/dL  CBC      Result Value Ref Range   WBC 5.2  4.0 - 10.5 K/uL   RBC 4.43  3.87 - 5.11 MIL/uL   Hemoglobin 14.1  12.0 - 15.0 g/dL   HCT 41.7  36.0 - 46.0 %   MCV 94.1  78.0 - 100.0 fL   MCH 31.8  26.0 - 34.0 pg   MCHC 33.8  30.0 - 36.0 g/dL   RDW 12.8  11.5 - 15.5 %   Platelets 208  150 - 400 K/uL  BASIC METABOLIC PANEL      Result Value Ref Range   Sodium 140  137 - 147 mEq/L   Potassium 4.4  3.7 - 5.3 mEq/L   Chloride 101  96 - 112 mEq/L   CO2 23  19 - 32 mEq/L   Glucose, Bld 174 (*) 70 - 99 mg/dL   BUN 10  6 - 23 mg/dL   Creatinine, Ser 0.53  0.50 - 1.10 mg/dL   Calcium 9.2  8.4 - 10.5 mg/dL   GFR calc non Af Amer >90  >90 mL/min   GFR calc Af Amer >90  >90 mL/min   Anion gap 16 (*) 5 - 15  GLUCOSE, CAPILLARY      Result Value Ref Range   Glucose-Capillary 165 (*) 70 - 99 mg/dL  GLUCOSE, CAPILLARY      Result Value Ref Range   Glucose-Capillary 194 (*) 70 - 99 mg/dL  GLUCOSE, CAPILLARY      Result Value Ref Range   Glucose-Capillary 190 (*) 70 - 99 mg/dL   Comment 1 Documented in Chart     Comment 2 Notify RN    GLUCOSE, CAPILLARY      Result Value Ref Range   Glucose-Capillary 186 (*) 70 - 99 mg/dL  I-STAT TROPOININ, ED      Result Value Ref Range   Troponin i, poc 0.00  0.00 - 0.08 ng/mL   Comment 3             The neurologically relevant items on the patient's problem list were  reviewed on today's visit.  Neurologic Examination  A problem focused neurological exam (12 or more points of the single system neurologic examination, vital signs counts as 1 point, cranial nerves count for 8 points) was performed.  Blood pressure 177/90, pulse 101, height 5\' 5"  (1.651 m), weight 253 lb 6.4 oz (114.941 kg).  General - Well nourished, well developed, in no  apparent distress.  Ophthalmologic - Sharp disc margins OU.  Cardiovascular - Regular rate and rhythm with no murmur.  Mental Status -  Level of arousal and orientation to time, place, and person were intact. Language including expression, naming, repetition, comprehension was assessed and found intact. Attention span and concentration were normal. Recent and remote memory were delayed recall 1/3. Fund of Knowledge was assessed and was intact.  Cranial Nerves II - XII - II - Visual field intact OU. III, IV, VI - Extraocular movements intact. V - Facial sensation intact bilaterally. VII - Facial movement intact bilaterally. VIII - Hearing & vestibular intact bilaterally. X - Palate elevates symmetrically, mild dysarthria.  XI - Chin turning & shoulder shrug intact bilaterally. XII - Tongue protrusion intact.  Motor Strength - The patient's strength was normal in all extremities left dexterity mild difficulty and pronator drift was absent.  Bulk was normal and fasciculations were absent.   Motor Tone - Muscle tone was assessed at the neck and appendages and was normal.  Reflexes - The patient's reflexes were normal in all extremities and she had no pathological reflexes.  Sensory - Light touch, temperature/pinprick, vibration and proprioception, and Romberg testing were assessed and were normal.    Coordination - The patient had normal movements in the hands and feet with no ataxia or dysmetria.  Tremor was absent.  Gait and Station - The patient's transfers, posture, gait, station, and turns were observed as normal.  Data reviewed: I personally reviewed the images and agree with the radiology interpretations.  Ct Head Wo Contrast  07/21/2013 IMPRESSION: No evidence of acute intracranial abnormality. Small vessel ischemic changes with intracranial atherosclerosis.  MRI/A Brain Wo Contrast  07/22/2013 IMPRESSION: Multifocal cerebral hemispheric white matter lesions including a 1 cm  region of restricted diffusion within the mid portion of the corpus callosum to the left of midline and a few areas of lower level restricted diffusion in the white matter. The differential diagnosis is that of multiple sclerosis versus multiple white matter infarctions due to atherosclerosis or vasculitis. MR angiography does not show any major vessel occlusion. There is considerable irregularity in the carotid siphon regions and some irregularity of the more distal intracranial branch vessels that go along with the presence of vascular pathology. 3 mm left PCOM aneurysm.  Carotid Doppler Prelim <39% stenosis bilat ICAs  2D Echocardiogram EF 50-55%, no definite source of emboli identified  EKG Sinus tachycardia.   LDL not able to calculate and A1C 7.6  Assessment: As you may recall, she is a 53 y.o. Caucasian female with PMH of HTN and DM not compliant with medication admitted on 07/21/13 due to slurry speech. MRI showed left mid CC stroke, consistent with ACA small vessel stroke. Currently on ASA 325mg  and lipitor 10mg . She is following with PCP for stroke risk factor modification. She needs to continue to monitor BP and glucose at home to achieve optimal control. Her lipitor dose can be increased. She has not completely quit smoking yet. Pt goes to see PCP tomorrow as scheduled.  Plan:  - continue ASA 325mg  and recommend to increase lipitor  to 40mg   - need BP monitoring device at home to monitor BP - close monitor glucose level at home - quit smoking - weight loss - follow up with repeat CTA or MRA in one year to evaluate 30mm left PCOM aneurysm. - RTC in 2 months.  No orders of the defined types were placed in this encounter.    Patient Instructions  - continue the ASA 325 mg for stroke prevention - continue the lipitor for stroke prevention. Recommend to increase lipitor dose from 10mg  to 40mg  since your lipid panel testing was very abnormal. - recommend you request a home BP monitoring  device prescription from your PCP  - measure BP at home - measure glucose at home too - quit smoking completely - exercise and encourage weight loss - follow up with your PCP regularly (tomorrow) for stroke risk factor modification. - follow up in 2 months.    Rosalin Hawking, MD PhD [facility] [address] [place], [place] [address] [phone]

## 2013-09-03 NOTE — Patient Instructions (Signed)
-   continue the ASA 325 mg for stroke prevention - continue the lipitor for stroke prevention. Recommend to increase lipitor dose from 10mg  to 40mg  since your lipid panel testing was very abnormal. - recommend you request a home BP monitoring device prescription from your PCP  - measure BP at home - measure glucose at home too - quit smoking completely - exercise and encourage weight loss - follow up with your PCP regularly (tomorrow) for stroke risk factor modification. - follow up in 2 months.

## 2013-09-04 ENCOUNTER — Encounter: Payer: Self-pay | Admitting: Family Medicine

## 2013-09-04 ENCOUNTER — Ambulatory Visit (INDEPENDENT_AMBULATORY_CARE_PROVIDER_SITE_OTHER): Payer: 59 | Admitting: Family Medicine

## 2013-09-04 VITALS — BP 150/96 | Ht 65.0 in | Wt 251.0 lb

## 2013-09-04 DIAGNOSIS — Z79899 Other long term (current) drug therapy: Secondary | ICD-10-CM

## 2013-09-04 DIAGNOSIS — E782 Mixed hyperlipidemia: Secondary | ICD-10-CM

## 2013-09-04 DIAGNOSIS — I10 Essential (primary) hypertension: Secondary | ICD-10-CM

## 2013-09-04 DIAGNOSIS — I6381 Other cerebral infarction due to occlusion or stenosis of small artery: Secondary | ICD-10-CM

## 2013-09-04 DIAGNOSIS — E785 Hyperlipidemia, unspecified: Secondary | ICD-10-CM

## 2013-09-04 DIAGNOSIS — I6350 Cerebral infarction due to unspecified occlusion or stenosis of unspecified cerebral artery: Secondary | ICD-10-CM

## 2013-09-04 DIAGNOSIS — E119 Type 2 diabetes mellitus without complications: Secondary | ICD-10-CM

## 2013-09-04 MED ORDER — GLIPIZIDE 5 MG PO TABS: 5.0000 mg | ORAL_TABLET | Freq: Every day | ORAL | Status: DC

## 2013-09-04 MED ORDER — ATORVASTATIN CALCIUM 20 MG PO TABS: 20.0000 mg | ORAL_TABLET | Freq: Every day | ORAL | Status: DC

## 2013-09-04 NOTE — Progress Notes (Signed)
   Subjective:    Patient ID: Shannon Price, female    DOB: 01/25/1960, 53 y.o.   MRN: [id number]  HPI Patient is here to follow up after having a stroke in July. Patient states she has no other concerns.  Patient reports sugars are still mildly elevated.  Of note patient claims she has tightened up on her diet considerably.  Compliant with blood pressure medication. No obvious side effects.  Now compliant with her lipid medication. Of note had recently not been on medicine for some time. Patient's neurologist feels she will likely need to be on a higher dose. See neurologist note.  No new neurological symptoms. No TIAs no new stroke.  Notes ongoing clumsiness on left side. And some weakness on left side of face. Overall able to perform her usual functions improving as far as daily activity level. Review of Systems No headache no chest pain no back pain no abdominal pain no change in bowel habits no blood in stool ROS otherwise negative    Objective:   Physical Exam  Alert good hydration. Lungs clear. Heart regular in rhythm. HEENT normal. Left side of face somewhat week. Left hand somewhat clumsy. Ankles without edema.      Assessment & Plan:  Impression status post stroke #2 hypertension good control. #3 hyperlipidemia suboptimum, however patient was off medicine for some time. #4 type 2 diabetes improving. Plan add glipizide 2 regimen. Increase Lipitor to 20 mg daily. Rationale discussed. Followup as scheduled. Diet exercise discussed. WSL

## 2013-10-28 LAB — HEMOGLOBIN A1C
HEMOGLOBIN A1C: 7.2 % — AB (ref <5.7)
MEAN PLASMA GLUCOSE: 160 mg/dL — AB (ref <117)

## 2013-10-28 LAB — LIPID PANEL
Cholesterol: 200 mg/dL (ref 0–200)
HDL: 41 mg/dL (ref >39)
LDL CALC: 93 mg/dL (ref 0–99)
Total CHOL/HDL Ratio: 4.9 Ratio
Triglycerides: 330 mg/dL — ABNORMAL HIGH (ref <150)
VLDL: 66 mg/dL — AB (ref 0–40)

## 2013-10-28 LAB — HEPATIC FUNCTION PANEL
ALT: 19 U/L (ref 0–35)
AST: 12 U/L (ref 0–37)
Albumin: 3.9 g/dL (ref 3.5–5.2)
Alkaline Phosphatase: 89 U/L (ref 39–117)
BILIRUBIN DIRECT: 0.1 mg/dL (ref 0.0–0.3)
Indirect Bilirubin: 0.2 mg/dL (ref 0.2–1.2)
Total Bilirubin: 0.3 mg/dL (ref 0.2–1.2)
Total Protein: 6.6 g/dL (ref 6.0–8.3)

## 2013-11-03 ENCOUNTER — Encounter: Payer: Self-pay | Admitting: Family Medicine

## 2013-11-03 ENCOUNTER — Ambulatory Visit (INDEPENDENT_AMBULATORY_CARE_PROVIDER_SITE_OTHER): Payer: 59 | Admitting: Family Medicine

## 2013-11-03 VITALS — BP 112/80 | Ht 65.0 in | Wt 256.1 lb

## 2013-11-03 DIAGNOSIS — G4700 Insomnia, unspecified: Secondary | ICD-10-CM

## 2013-11-03 DIAGNOSIS — I10 Essential (primary) hypertension: Secondary | ICD-10-CM

## 2013-11-03 DIAGNOSIS — Z23 Encounter for immunization: Secondary | ICD-10-CM

## 2013-11-03 DIAGNOSIS — E785 Hyperlipidemia, unspecified: Secondary | ICD-10-CM

## 2013-11-03 DIAGNOSIS — I639 Cerebral infarction, unspecified: Secondary | ICD-10-CM

## 2013-11-03 DIAGNOSIS — I6381 Other cerebral infarction due to occlusion or stenosis of small artery: Secondary | ICD-10-CM

## 2013-11-03 DIAGNOSIS — E119 Type 2 diabetes mellitus without complications: Secondary | ICD-10-CM

## 2013-11-03 DIAGNOSIS — G44229 Chronic tension-type headache, not intractable: Secondary | ICD-10-CM

## 2013-11-03 MED ORDER — CHLORZOXAZONE 500 MG PO TABS: 500.0000 mg | ORAL_TABLET | Freq: Three times a day (TID) | ORAL | Status: DC | PRN

## 2013-11-03 MED ORDER — GLIPIZIDE 5 MG PO TABS: 5.0000 mg | ORAL_TABLET | Freq: Two times a day (BID) | ORAL | Status: DC

## 2013-11-03 MED ORDER — ZOLPIDEM TARTRATE 10 MG PO TABS: 10.0000 mg | ORAL_TABLET | Freq: Every evening | ORAL | Status: DC | PRN

## 2013-11-03 NOTE — Progress Notes (Signed)
   Subjective:    Patient ID: Shannon Price, female    DOB: 18-Mar-1960, 53 y.o.   MRN: [id number]  Diabetes She presents for her follow-up diabetic visit. She has type 2 diabetes mellitus. Her disease course has been stable. There are no hypoglycemic associated symptoms. There are no diabetic associated symptoms. There are no hypoglycemic complications. Symptoms are stable. There are no diabetic complications. There are no known risk factors for coronary artery disease. Current diabetic treatment includes oral agent (dual therapy). She is compliant with treatment all of the time.   Patient had bloodwork done on 10/28/13, results in system and A1C was included.   Patient states that she has been having stress headaches a lot.   Patient states she has been dealing with insomnia and she wants to discuss this with the doctor.  No hx of meds   Results for orders placed in visit on 09/04/13  LIPID PANEL      Result Value Ref Range   Cholesterol 200  0 - 200 mg/dL   Triglycerides 330 (*) <150 mg/dL   HDL 41  >39 mg/dL   Total CHOL/HDL Ratio 4.9     VLDL 66 (*) 0 - 40 mg/dL   LDL Cholesterol 93  0 - 99 mg/dL  HEPATIC FUNCTION PANEL      Result Value Ref Range   Total Bilirubin 0.3  0.2 - 1.2 mg/dL   Bilirubin, Direct 0.1  0.0 - 0.3 mg/dL   Indirect Bilirubin 0.2  0.2 - 1.2 mg/dL   Alkaline Phosphatase 89  39 - 117 U/L   AST 12  0 - 37 U/L   ALT 19  0 - 35 U/L   Total Protein 6.6  6.0 - 8.3 g/dL   Albumin 3.9  3.5 - 5.2 g/dL  HEMOGLOBIN A1C      Result Value Ref Range   Hemoglobin A1C 7.2 (*) <5.7 %   Mean Plasma Glucose 160 (*) <117 mg/dL   No low sugar spells yet, Numbers are high at times  Post headaches, back of the neck, they are difficult  Every other day  Not working still, concentration is not good, forgetting to tke the pan off etc.   Review of Systems No chest pain no abdominal pain no further TIA symptoms no loss of consciousness no shortness of breath ROS otherwise  negative    Objective:   Physical Exam Alert vital signs stable blood pressure good HEENT normal neck supple some posterior lateral mild tenderness. Lungs no wheezes no crackles no tachypnea heart regular rate and rhythm neuro exam no focal neurological deficits affect appropriate.  C diabetic foot exam     Assessment & Plan:  Impression #1 type 2 diabetes control improving discussed #2 status post lacunar stroke none further at this time discussed #3 hypertension good control. #4 hyperlipidemia control improving considerably discussed #5 tension headaches with spasm element discussed local measures discussed #6 insomnia ongoing and progressive. Plan double up Glucotrol to one tablet twice a day. Exercise discuss encouraged to stop smoking. Ambien when necessary for sleep. Use just when he needs to. Add chlorzoxazone when necessary for muscle spasm. Follow-up in several months. WS L

## 2013-11-04 ENCOUNTER — Ambulatory Visit: Payer: 59 | Admitting: Family Medicine

## 2013-11-11 ENCOUNTER — Ambulatory Visit (INDEPENDENT_AMBULATORY_CARE_PROVIDER_SITE_OTHER): Payer: 59 | Admitting: Neurology

## 2013-11-11 ENCOUNTER — Encounter: Payer: Self-pay | Admitting: Neurology

## 2013-11-11 DIAGNOSIS — G4700 Insomnia, unspecified: Secondary | ICD-10-CM

## 2013-11-11 DIAGNOSIS — G2581 Restless legs syndrome: Secondary | ICD-10-CM

## 2013-11-11 DIAGNOSIS — F419 Anxiety disorder, unspecified: Secondary | ICD-10-CM | POA: Insufficient documentation

## 2013-11-11 DIAGNOSIS — Z720 Tobacco use: Secondary | ICD-10-CM

## 2013-11-11 DIAGNOSIS — I10 Essential (primary) hypertension: Secondary | ICD-10-CM

## 2013-11-11 MED ORDER — CITALOPRAM HYDROBROMIDE 10 MG PO TABS: 10.0000 mg | ORAL_TABLET | Freq: Every day | ORAL | Status: DC

## 2013-11-11 MED ORDER — ROPINIROLE HCL 0.5 MG PO TABS: 0.5000 mg | ORAL_TABLET | Freq: Every day | ORAL | Status: DC

## 2013-11-11 NOTE — Patient Instructions (Signed)
-   continue ASA for stroke prevention - continue lipitor 40mg  for stroke prevention - prescribe celexa 10mg  for anxiety and depression post stroke - prescribe requip for restless leg syndrome - check BP at home daily and ask PCP for a prescription of BP home monitoring device. - need sleep study to rule out OSA. - Follow up with your primary care physician for stroke risk factor modification. Recommend maintain blood pressure goal <130/80, diabetes with hemoglobin A1c goal below 6.5% and lipids with LDL cholesterol goal below 70 mg/dL.  - follow up in 2 months.

## 2013-11-11 NOTE — Progress Notes (Signed)
STROKE NEUROLOGY FOLLOW UP NOTE  NAME: Shannon Price DOB: 03-Dec-1960  REASON FOR VISIT: stroke follow up HISTORY FROM: pt and chart  Today we had the pleasure of seeing Shannon Price in follow-up at our Neurology Clinic. Pt was accompanied by no one.   History Summary Shannon Price is an right handed 53 y.o. female who has not been on her medications for both DM and HTN for the past year. In additon she smokes 3 PKS per day of cigarettes. She was admitted to [facility] on 07/21/13 due to sudden onset ofspeech difficulty("I sound like a retard") and a sensation of left face "pulling". 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 She was discharged on ASA and lipitor.  09/03/13 follow up - the patient has been doing better. She stated that her speech is improved much. Currently close to her baseline. She denies any residue weakness or numbness. However, she stated that she has a lot of memory difficulty after stroke. For example, she will go to grocery store and forgot to turn off the engine. She has several time for the last month forgetting to turn of the kitchen sink water to cause flooding in kitchen and she has several time to leave the stove on, even overnight. She saw PCP on 08/05/13 and changed regimen for DM control.  Interval History During the interval time, pt is doing the same. She still has multiple complains: 1. Dizziness/lightheadedness - it could happen anytime with no association with position, could last seconds or minutes. No fall. Did not check BP at that time as she does not have BP device at home. 2. Decreased concentration and attention - not able to focus. 3. Memory loss - not able to remember what being told 4. Anxiety - always feels anxious, on the go 5. Tension headache at the base of the skull 6. Not able to fall asleep and not able to maintain sleep - wake up every 2 hours 7. Restless leg at  night - kept her awake, and has to move legs otherwise whole body itchy 8. Snore during sleep - as per husband 9. Not quit smoking yet  She is on lipitor 40mg  now for HLD. Her glucose level better at 160s with two meds - metformin and glipizide. Does not check BP at home. Her last A1C 7.2 and LDL 93  REVIEW OF SYSTEMS: Full 14 system review of systems performed and notable only for those listed below and in HPI above, all others are negative:   Restless leg, insomnia, frequent waking, nervous, anxiety, memory loss, dizziness, HA, speech difficulty, agitation, decreased concentration, neck pain, neck stiffness, walking difficulty, joint pain and joint swelling  The following represents the patient's updated allergies and side effects list: No Known Allergies  Labs since last visit of relevance include the following: Results for orders placed or performed in visit on 09/04/13  Lipid panel  Result Value Ref Range   Cholesterol 200 0 - 200 mg/dL   Triglycerides 330 (H) <150 mg/dL   HDL 41 >39 mg/dL   Total CHOL/HDL Ratio 4.9 Ratio   VLDL 66 (H) 0 - 40 mg/dL   LDL Cholesterol 93 0 - 99 mg/dL  Hepatic function panel  Result Value Ref Range   Total Bilirubin 0.3 0.2 - 1.2 mg/dL   Bilirubin, Direct 0.1 0.0 - 0.3 mg/dL   Indirect Bilirubin 0.2 0.2 - 1.2 mg/dL  Alkaline Phosphatase 89 39 - 117 U/L   AST 12 0 - 37 U/L   ALT 19 0 - 35 U/L   Total Protein 6.6 6.0 - 8.3 g/dL   Albumin 3.9 3.5 - 5.2 g/dL  Hemoglobin A1c  Result Value Ref Range   Hgb A1c MFr Bld 7.2 (H) <5.7 %   Mean Plasma Glucose 160 (H) <117 mg/dL    The neurologically relevant items on the patient's problem list were reviewed on today's visit.  Neurologic Examination  A problem focused neurological exam (12 or more points of the single system neurologic examination, vital signs counts as 1 point, cranial nerves count for 8 points) was performed.  Blood pressure 156/98, pulse 93, height 5\' 5"  (1.651 m), weight 261 lb  (118.389 kg).  General - Well nourished, well developed, in no apparent distress.  Ophthalmologic - Sharp disc margins OU.  Cardiovascular - Regular rate and rhythm with no murmur.  Mental Status -  Level of arousal and orientation to time, place, and person were intact. Language including expression, naming, repetition, comprehension was assessed and found intact. Attention span and concentration were normal. Recent and remote memory were delayed recall 1/3. Fund of Knowledge was assessed and was intact.  Cranial Nerves II - XII - II - Visual field intact OU. III, IV, VI - Extraocular movements intact. V - Facial sensation intact bilaterally. VII - Facial movement intact bilaterally. VIII - Hearing & vestibular intact bilaterally. X - Palate elevates symmetrically.  XI - Chin turning & shoulder shrug intact bilaterally. XII - Tongue protrusion intact.  Motor Strength - The patient's strength was normal in all extremities left dexterity mild difficulty and pronator drift was absent.  Bulk was normal and fasciculations were absent.   Motor Tone - Muscle tone was assessed at the neck and appendages and was normal.  Reflexes - The patient's reflexes were normal in all extremities and she had no pathological reflexes.  Sensory - Light touch, temperature/pinprick, vibration and proprioception, and Romberg testing were assessed and were normal.    Coordination - The patient had normal movements in the hands and feet with no ataxia or dysmetria.  Tremor was absent.  Gait and Station - The patient's transfers, posture, gait, station, and turns were observed as normal.  Data reviewed: I personally reviewed the images and agree with the radiology interpretations.  Ct Head Wo Contrast  07/21/2013 IMPRESSION: No evidence of acute intracranial abnormality. Small vessel ischemic changes with intracranial atherosclerosis.  MRI/A Brain Wo Contrast  07/22/2013 IMPRESSION: Multifocal cerebral  hemispheric white matter lesions including a 1 cm region of restricted diffusion within the mid portion of the corpus callosum to the left of midline and a few areas of lower level restricted diffusion in the white matter. The differential diagnosis is that of multiple sclerosis versus multiple white matter infarctions due to atherosclerosis or vasculitis. MR angiography does not show any major vessel occlusion. There is considerable irregularity in the carotid siphon regions and some irregularity of the more distal intracranial branch vessels that go along with the presence of vascular pathology. 3 mm left PCOM aneurysm.  Carotid Doppler Prelim <39% stenosis bilat ICAs  2D Echocardiogram EF 50-55%, no definite source of emboli identified  EKG Sinus tachycardia.   LDL unable to calculate -> 93 and A1C 7.6 -> 7.2  Assessment: As you may recall, she is a 53 y.o. Caucasian female with PMH of HTN and DM not compliant with medication admitted on 07/21/13 due to  slurry speech. MRI showed left mid CC stroke, consistent with ACA small vessel stroke. Currently on ASA 325mg  and lipitor 40mg . She is following with PCP for stroke risk factor modification. She needs to continue to monitor BP and glucose at home to achieve optimal control. She has not completely quit smoking yet. However, at meantime, she has multiple complains due to anxiety, memory loss, decreased concentration, insomnia, restless leg and snoring. Will try celexa for anxiety/depression post stroke, requip for RLS and sleep study to rule out OSA.   Plan:  - continue ASA 325mg  and lipitor 40mg  for stroke prevention. - need BP monitoring device at home to monitor BP - close monitor glucose level at home - celexa 10mg  daily for anxiety/depression post stroke. She stated that welbutrin did not help so far. - requip 0.5mg  QHs for RLS - sleep study referral to rule out OSA - quit smoking - weight loss - Follow up with your primary care physician for  stroke risk factor modification. Recommend maintain blood pressure goal <130/80, diabetes with hemoglobin A1c goal below 6.5% and lipids with LDL cholesterol goal below 70 mg/dL.  - follow up with repeat CTA or MRA in one year to evaluate 10mm left PCOM aneurysm. - RTC in 2 months.  Orders Placed This Encounter  Procedures  . Ferritin  . Iron and TIBC  . Ambulatory referral to Sleep Studies    Referral Priority:  Routine    Referral Type:  Consultation    Referral Reason:  Specialty Services Required    Number of Visits Requested:  1    Patient Instructions  - continue ASA for stroke prevention - continue lipitor 40mg  for stroke prevention - prescribe celexa 10mg  for anxiety and depression post stroke - prescribe requip for restless leg syndrome - check BP at home daily and ask PCP for a prescription of BP home monitoring device. - need sleep study to rule out OSA. - Follow up with your primary care physician for stroke risk factor modification. Recommend maintain blood pressure goal <130/80, diabetes with hemoglobin A1c goal below 6.5% and lipids with LDL cholesterol goal below 70 mg/dL.  - follow up in 2 months.    Rosalin Hawking, MD PhD [facility] [address] [place], [place] [address] [phone]

## 2013-11-12 LAB — IRON AND TIBC
Iron Saturation: 16 % (ref 15–55)
Iron: 54 ug/dL (ref 35–155)
TIBC: 341 ug/dL (ref 250–450)
UIBC: 287 ug/dL (ref 150–375)

## 2013-11-12 LAB — FERRITIN: Ferritin: 122 ng/mL (ref 15–150)

## 2013-11-13 ENCOUNTER — Telehealth: Payer: Self-pay | Admitting: *Deleted

## 2013-11-13 NOTE — Telephone Encounter (Signed)
Called patient and informed her of normal iron study, no change in treatment and continue on current medication, patient verbalized understanding and had no further questions or concerns.

## 2013-11-13 NOTE — Telephone Encounter (Signed)
-----   Message from Rance Muir sent at 11/13/2013  9:24 AM EST -----   ----- Message -----    From: Rosalin Hawking, MD    Sent: 11/12/2013   6:23 PM      To: [facility] Triage Pool  Hello, please let pt know that her iron study we did the other day was all within normal range. No change of treatment is needed. Continue the current medication. Thanks.  Rosalin Hawking, MD PhD Stroke Neurology 11/12/2013 6:23 PM

## 2013-11-16 ENCOUNTER — Telehealth: Payer: Self-pay | Admitting: Neurology

## 2013-11-16 DIAGNOSIS — G4761 Periodic limb movement disorder: Secondary | ICD-10-CM

## 2013-11-16 DIAGNOSIS — I639 Cerebral infarction, unspecified: Secondary | ICD-10-CM

## 2013-11-16 DIAGNOSIS — R0683 Snoring: Secondary | ICD-10-CM

## 2013-11-16 NOTE — Telephone Encounter (Signed)
Sleep study request review: This patient has an underlying medical history of obesity, type 2 diabetes, heavy smoking, hypertension, recent stroke within the last 6 months, and is referred by Dr. Erlinda Hong for an attended sleep study due to a report of snoring, memory loss after stroke, restless leg symptoms, multiple nighttime awakenings. I will order a split-night sleep study and see the patient in sleep medicine consultation afterwards. Please print this note and attach to chart.   Technologist instructions: Please score at 4% and split if 2 hour estimated AHI >20/h.    Star Age, MD, PhD [facility])

## 2013-11-16 NOTE — Telephone Encounter (Signed)
Dr. Erlinda Hong, refers patient for attended sleep study.  Height: 5'5"  Weight: 261 lbs  BMI: 43.43  Past Medical History: DM and HTN for the past year. In additon she smokes 3 PKS per day of cigarettes. She was admitted to [facility] on 07/21/13 due to sudden onset of speech difficulty("I sound like a retard") and a sensation of left face "pulling". 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 However, she stated that she has a lot of memory difficulty after stroke.  Sleep Symptoms: Restless leg, insomnia, frequent waking. Not able to fall asleep and not able to maintain sleep - wake up every 2 hours  Restless leg at night - kept her awake, and has to move legs otherwise whole body itchy, snore during sleep per husband   Epworth Score: Unable to reach the patient   Medication:  Aspirin (Tab) aspirin 325 MG Take 1 tablet (325 mg total) by mouth daily.       Atorvastatin Calcium (Tab) LIPITOR 20 MG Take 1 tablet (20 mg total) by mouth daily.      BuPROPion HCl (Tablet SR 12 hr) WELLBUTRIN SR 150 MG Take 1 tablet (150 mg total) by mouth 2 (two) times daily.      Chlorzoxazone (Tab) PARAFON 500 MG Take 1 tablet (500 mg total) by mouth 3 (three) times daily as needed for muscle spasms.      Citalopram Hydrobromide (Tab) CELEXA 10 MG Take 1 tablet (10 mg total) by mouth daily.      Folic Acid (Tab) FOLVITE 1 MG Take 1 tablet (1 mg total) by mouth daily.      GlipiZIDE (Tab) GLUCOTROL 5 MG Take 1 tablet (5 mg total) by mouth 2 (two) times daily before a meal.      Lisinopril-Hydrochlorothiazide (Tab) PRINZIDE,ZESTORETIC 20-12.5 MG Take 1 tablet by mouth daily.      MetFORMIN HCl (Tab) GLUCOPHAGE 1000 MG Take 1 tablet (1,000 mg total) by mouth 2 (two) times daily with a meal.      Multiple Vitamin (Tab) multivitamin with minerals  Take 1 tablet by mouth daily.      ROPINIRole HCl (Tab) REQUIP 0.5 MG  Take 1 tablet (0.5 mg total) by mouth at bedtime.      Zolpidem Tartrate (Tab) AMBIEN 10 MG Take 1 tablet (10 mg total) by mouth at bedtime as needed for sleep.       Ins: Patent attorney & Plan: As you may recall, she is a 53 y.o. Caucasian female with PMH of HTN and DM not compliant with medication admitted on 07/21/13 due to slurry speech. MRI showed left mid CC stroke, consistent with ACA small vessel stroke. Currently on ASA 325mg  and lipitor 40mg . She is following with PCP for stroke risk factor modification. She needs to continue to monitor BP and glucose at home to achieve optimal control. She has not completely quit smoking yet. However, at meantime, she has multiple complains due to anxiety, memory loss, decreased concentration, insomnia, restless leg and snoring. Will try celexa for anxiety/depression post stroke, requip for RLS and sleep study to rule out OSA.   Plan:  - continue ASA 325mg  and lipitor 40mg  for stroke prevention. - need BP monitoring device at home to monitor BP - close monitor glucose level at home - celexa 10mg  daily for anxiety/depression post stroke. She stated that welbutrin did not help so far. - requip 0.5mg   QHs for RLS - sleep study referral to rule out OSA - quit smoking - weight loss - Follow up with your primary care physician for stroke risk factor modification. Recommend maintain blood pressure goal <130/80, diabetes with hemoglobin A1c goal below 6.5% and lipids with LDL cholesterol goal below 70 mg/dL.  - follow up with repeat CTA or MRA in one year to evaluate 68mm left PCOM aneurysm. - RTC in 2 months.  Orders Placed This Encounter  Procedures  . Ferritin  . Iron and TIBC  . Ambulatory referral to Sleep Studies    Referral Priority:  Routine    Referral Type:  Consultation    Referral Reason:  Specialty Services Required    Number of Visits Requested:  1    Patient Instructions  - continue  ASA for stroke prevention - continue lipitor 40mg  for stroke prevention - prescribe celexa 10mg  for anxiety and depression post stroke - prescribe requip for restless leg syndrome - check BP at home daily and ask PCP for a prescription of BP home monitoring device. - need sleep study to rule out OSA. - Follow up with your primary care physician for stroke risk factor modification. Recommend maintain blood pressure goal <130/80, diabetes with hemoglobin A1c goal below 6.5% and lipids with LDL cholesterol goal below 70 mg/dL.  - follow up in 2 months.      Please review patient information and submit instructions for scheduling and orders for sleep technologist. Thank you.

## 2014-01-04 DIAGNOSIS — Z0271 Encounter for disability determination: Secondary | ICD-10-CM

## 2014-01-19 ENCOUNTER — Ambulatory Visit: Payer: Self-pay | Admitting: Neurology

## 2014-02-05 ENCOUNTER — Ambulatory Visit: Payer: 59 | Admitting: Family Medicine

## 2014-04-29 DIAGNOSIS — Z0289 Encounter for other administrative examinations: Secondary | ICD-10-CM

## 2014-04-29 DIAGNOSIS — Z029 Encounter for administrative examinations, unspecified: Secondary | ICD-10-CM

## 2014-10-13 ENCOUNTER — Encounter: Payer: Self-pay | Admitting: Neurology

## 2014-10-13 ENCOUNTER — Ambulatory Visit (INDEPENDENT_AMBULATORY_CARE_PROVIDER_SITE_OTHER): Payer: Self-pay | Admitting: Neurology

## 2014-10-13 VITALS — BP 178/100 | HR 110 | Ht 65.0 in | Wt 259.4 lb

## 2014-10-13 DIAGNOSIS — F419 Anxiety disorder, unspecified: Secondary | ICD-10-CM

## 2014-10-13 DIAGNOSIS — I10 Essential (primary) hypertension: Secondary | ICD-10-CM

## 2014-10-13 DIAGNOSIS — Z720 Tobacco use: Secondary | ICD-10-CM

## 2014-10-13 DIAGNOSIS — I63312 Cerebral infarction due to thrombosis of left middle cerebral artery: Secondary | ICD-10-CM | POA: Insufficient documentation

## 2014-10-13 DIAGNOSIS — E1159 Type 2 diabetes mellitus with other circulatory complications: Secondary | ICD-10-CM

## 2014-10-13 MED ORDER — METFORMIN HCL 1000 MG PO TABS: 1000.0000 mg | ORAL_TABLET | Freq: Two times a day (BID) | ORAL | Status: DC

## 2014-10-13 MED ORDER — ATORVASTATIN CALCIUM 20 MG PO TABS: 20.0000 mg | ORAL_TABLET | Freq: Every day | ORAL | Status: DC

## 2014-10-13 MED ORDER — GLIPIZIDE 5 MG PO TABS: 5.0000 mg | ORAL_TABLET | Freq: Two times a day (BID) | ORAL | Status: DC

## 2014-10-13 MED ORDER — ASPIRIN 325 MG PO TABS: 325.0000 mg | ORAL_TABLET | Freq: Every day | ORAL | Status: AC

## 2014-10-13 MED ORDER — CITALOPRAM HYDROBROMIDE 10 MG PO TABS: 10.0000 mg | ORAL_TABLET | Freq: Every day | ORAL | Status: DC

## 2014-10-13 MED ORDER — LISINOPRIL-HYDROCHLOROTHIAZIDE 20-12.5 MG PO TABS: 1.0000 | ORAL_TABLET | Freq: Every day | ORAL | Status: DC

## 2014-10-13 NOTE — Progress Notes (Signed)
STROKE NEUROLOGY FOLLOW UP NOTE  NAME: Shannon Price DOB: 11/04/1960  REASON FOR VISIT: stroke follow up HISTORY FROM: pt and chart  Today we had the pleasure of seeing Shannon Price in follow-up at our Neurology Clinic. Pt was accompanied by no one.   History Summary Shannon Price is an right handed 54 y.o. female who has not been on her medications for both DM and HTN for the past year. In additon she smokes 3 PKS per day of cigarettes. She was admitted to [facility] on 07/21/13 due to sudden onset ofspeech difficulty("I sound like a retard") and a sensation of left face "pulling". 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 She was discharged on ASA and lipitor.  09/03/13 follow up - the patient has been doing better. She stated that her speech is improved much. Currently close to her baseline. She denies any residue weakness or numbness. However, she stated that she has a lot of memory difficulty after stroke. For example, she will go to grocery store and forgot to turn off the engine. She has several time for the last month forgetting to turn of the kitchen sink water to cause flooding in kitchen and she has several time to leave the stove on, even overnight. She saw PCP on 08/05/13 and changed regimen for DM control.  11/11/13 follow up - During the interval time, pt is doing the same. She still has multiple complains: 1. Dizziness/lightheadedness - it could happen anytime with no association with position, could last seconds or minutes. No fall. Did not check BP at that time as she does not have BP device at home. 2. Decreased concentration and attention - not able to focus. 3. Memory loss - not able to remember what being told 4. Anxiety - always feels anxious, on the go 5. Tension headache at the base of the skull 6. Not able to fall asleep and not able to maintain sleep - wake up every 2 hours 7. Restless leg  at night - kept her awake, and has to move legs otherwise whole body itchy 8. Snore during sleep - as per husband 9. Not quit smoking yet  She is on lipitor 40mg  now for HLD. Her glucose level better at 160s with two meds - metformin and glipizide. Does not check BP at home. Her last A1C 7.2 and LDL 93  Interval History During the interval time, patient is doing the same. She has lost her job and has no Copy. She was not able to see her PCP due to no insurance, and all medications were not refilled. Her blood pressure was high in clinic 186/108. She still has decreased concentration and attention, memory loss, anxiety, tension headache, insomnia, continue smoking. She has applied for disability insurance, however was declined several times. Currently she and her husband hired a Chief Executive Officer working on disability approval.   I encouraged her to either go back to her previous job for a part-time job, or find a new job with light duty work. I believe gradually going back to work will make her life condition much better, able to afford for clinical visit and medication, decrease her stress levels, and improve her quality of life. However, she became tearful and run out of the clinic.   REVIEW OF SYSTEMS: Full 14 system review of systems performed and notable only for those listed below and in HPI above, all others  are negative:   Blurry vision, excessive thirst, restless leg, frequent urination, back pain, memory loss, dizziness, headache, speech difficulty, weakness, agitation, behavior problem, confusion, decreased concentration, depression, nervousness, anxious  The following represents the patient's updated allergies and side effects list: No Known Allergies  Labs since last visit of relevance include the following: Results for orders placed or performed in visit on 11/11/13  Ferritin  Result Value Ref Range   Ferritin 122 15 - 150 ng/mL  Iron and TIBC  Result Value Ref Range   TIBC  341 250 - 450 ug/dL   UIBC 287 150 - 375 ug/dL   Iron 54 35 - 155 ug/dL   Iron Saturation 16 15 - 55 %    The neurologically relevant items on the patient's problem list were reviewed on today's visit.  Neurologic Examination  A problem focused neurological exam (12 or more points of the single system neurologic examination, vital signs counts as 1 point, cranial nerves count for 8 points) was performed.  Blood pressure 178/100, pulse 110, height 5\' 5"  (1.651 m), weight 259 lb 6.4 oz (117.663 kg), SpO2 98 %.  Patient ran out of door before being examined.  Data reviewed: I personally reviewed the images and agree with the radiology interpretations.  Ct Head Wo Contrast  07/21/2013 IMPRESSION: No evidence of acute intracranial abnormality. Small vessel ischemic changes with intracranial atherosclerosis.  MRI/A Brain Wo Contrast  07/22/2013 IMPRESSION: Multifocal cerebral hemispheric white matter lesions including a 1 cm region of restricted diffusion within the mid portion of the corpus callosum to the left of midline and a few areas of lower level restricted diffusion in the white matter. The differential diagnosis is that of multiple sclerosis versus multiple white matter infarctions due to atherosclerosis or vasculitis. MR angiography does not show any major vessel occlusion. There is considerable irregularity in the carotid siphon regions and some irregularity of the more distal intracranial branch vessels that go along with the presence of vascular pathology. 3 mm left PCOM aneurysm.  Carotid Doppler Prelim <39% stenosis bilat ICAs  2D Echocardiogram EF 50-55%, no definite source of emboli identified  EKG Sinus tachycardia.   LDL unable to calculate -> 93 and A1C 7.6 -> 7.2  Assessment: As you may recall, she is a 54 y.o. Caucasian female with PMH of HTN and DM not compliant with medication admitted on 07/21/13 due to slurry speech. MRI showed left mid CC stroke, consistent with ACA  small vessel stroke. Was put on ASA 325mg  and lipitor 40mg . She was following with PCP for stroke risk factor modification. However, at meantime, she has multiple complains of anxiety, memory loss, decreased concentration, insomnia, restless leg and snoring. Put on celexa for anxiety/depression post stroke, requip for RLS and scheduled for sleep study to rule out OSA. However, patient lost insurance coverage, not able to follow up in clinic and all her medications were not refilled. Her BP was high in clinic today. She is seeking for disability insurance approval, however I encourage her to resume her part-time job or find a new job with light duty. However patient became tearful and ran out of clinic before being examined.  Plan:  - refill your medications - continue ASA and lipitor for stroke prevention - continue celexa for depression and anxiety - check BP at home - Follow up with your primary care physician for stroke risk factor modification. Recommend maintain blood pressure goal <130/80, diabetes with hemoglobin A1c goal below 6.5% and lipids with LDL cholesterol  goal below 70 mg/dL.  - RTC PRN.  Meds ordered this encounter  Medications  . aspirin 325 MG tablet    Sig: Take 1 tablet (325 mg total) by mouth daily.    Dispense:  30 tablet    Refill:  5  . glipiZIDE (GLUCOTROL) 5 MG tablet    Sig: Take 1 tablet (5 mg total) by mouth 2 (two) times daily before a meal.    Dispense:  60 tablet    Refill:  5  . atorvastatin (LIPITOR) 20 MG tablet    Sig: Take 1 tablet (20 mg total) by mouth daily.    Dispense:  30 tablet    Refill:  5  . citalopram (CELEXA) 10 MG tablet    Sig: Take 1 tablet (10 mg total) by mouth daily.    Dispense:  30 tablet    Refill:  5  . lisinopril-hydrochlorothiazide (PRINZIDE,ZESTORETIC) 20-12.5 MG tablet    Sig: Take 1 tablet by mouth daily.    Dispense:  30 tablet    Refill:  5  . metFORMIN (GLUCOPHAGE) 1000 MG tablet    Sig: Take 1 tablet (1,000 mg  total) by mouth 2 (two) times daily with a meal.    Dispense:  60 tablet    Refill:  5     No orders of the defined types were placed in this encounter.    Patient Instructions  - refill your medications - continue ASA and lipitor for stroke prevention - continue celexa for depression and anxiety - check BP at home - Follow up with your primary care physician for stroke risk factor modification. Recommend maintain blood pressure goal <130/80, diabetes with hemoglobin A1c goal below 6.5% and lipids with LDL cholesterol goal below 70 mg/dL.  - follow up as needed.    Rosalin Hawking, MD PhD [facility] [address], [place], [place] [address] [phone]

## 2014-10-13 NOTE — Patient Instructions (Addendum)
-   refill your medications - continue ASA and lipitor for stroke prevention - continue celexa for depression and anxiety - check BP at home - Follow up with your primary care physician for stroke risk factor modification. Recommend maintain blood pressure goal <130/80, diabetes with hemoglobin A1c goal below 6.5% and lipids with LDL cholesterol goal below 70 mg/dL.  - follow up as needed.

## 2015-07-19 ENCOUNTER — Emergency Department (HOSPITAL_COMMUNITY)
Admission: EM | Admit: 2015-07-19 | Discharge: 2015-07-19 | Disposition: A | Discharge status: Home / Self Care | Payer: BLUE CROSS/BLUE SHIELD | Attending: Emergency Medicine | Admitting: Emergency Medicine

## 2015-07-19 ENCOUNTER — Encounter (HOSPITAL_COMMUNITY): Payer: Self-pay | Admitting: Emergency Medicine

## 2015-07-19 ENCOUNTER — Emergency Department (HOSPITAL_COMMUNITY): Payer: BLUE CROSS/BLUE SHIELD

## 2015-07-19 DIAGNOSIS — Z7982 Long term (current) use of aspirin: Secondary | ICD-10-CM | POA: Diagnosis not present

## 2015-07-19 DIAGNOSIS — F17210 Nicotine dependence, cigarettes, uncomplicated: Secondary | ICD-10-CM | POA: Diagnosis not present

## 2015-07-19 DIAGNOSIS — Z7984 Long term (current) use of oral hypoglycemic drugs: Secondary | ICD-10-CM | POA: Insufficient documentation

## 2015-07-19 DIAGNOSIS — E119 Type 2 diabetes mellitus without complications: Secondary | ICD-10-CM | POA: Insufficient documentation

## 2015-07-19 DIAGNOSIS — M5090 Cervical disc disorder, unspecified, unspecified cervical region: Secondary | ICD-10-CM

## 2015-07-19 DIAGNOSIS — Z79899 Other long term (current) drug therapy: Secondary | ICD-10-CM | POA: Insufficient documentation

## 2015-07-19 DIAGNOSIS — R531 Weakness: Secondary | ICD-10-CM | POA: Diagnosis present

## 2015-07-19 DIAGNOSIS — I10 Essential (primary) hypertension: Secondary | ICD-10-CM | POA: Insufficient documentation

## 2015-07-19 DIAGNOSIS — M5030 Other cervical disc degeneration, unspecified cervical region: Secondary | ICD-10-CM | POA: Insufficient documentation

## 2015-07-19 LAB — COMPREHENSIVE METABOLIC PANEL
ALBUMIN: 3.9 g/dL (ref 3.5–5.0)
ALT: 24 U/L (ref 14–54)
ANION GAP: 6 (ref 5–15)
AST: 17 U/L (ref 15–41)
Alkaline Phosphatase: 94 U/L (ref 38–126)
BUN: 10 mg/dL (ref 6–20)
CHLORIDE: 102 mmol/L (ref 101–111)
CO2: 27 mmol/L (ref 22–32)
Calcium: 9.4 mg/dL (ref 8.9–10.3)
Creatinine, Ser: 0.58 mg/dL (ref 0.44–1.00)
GFR calc Af Amer: >60 mL/min (ref >60)
GLUCOSE: 241 mg/dL — AB (ref 65–99)
POTASSIUM: 3.8 mmol/L (ref 3.5–5.1)
Sodium: 135 mmol/L (ref 135–145)
TOTAL PROTEIN: 7.3 g/dL (ref 6.5–8.1)
Total Bilirubin: 0.5 mg/dL (ref 0.3–1.2)

## 2015-07-19 LAB — CBC WITH DIFFERENTIAL/PLATELET
BASOS ABS: 0 10*3/uL (ref 0.0–0.1)
BASOS PCT: 0 %
EOS ABS: 0.2 10*3/uL (ref 0.0–0.7)
EOS PCT: 2 %
HCT: 46.4 % — ABNORMAL HIGH (ref 36.0–46.0)
Hemoglobin: 15.4 g/dL — ABNORMAL HIGH (ref 12.0–15.0)
LYMPHS PCT: 26 %
Lymphs Abs: 2.5 10*3/uL (ref 0.7–4.0)
MCH: 30.5 pg (ref 26.0–34.0)
MCHC: 33.2 g/dL (ref 30.0–36.0)
MCV: 91.9 fL (ref 78.0–100.0)
Monocytes Absolute: 0.6 10*3/uL (ref 0.1–1.0)
Monocytes Relative: 6 %
NEUTROS ABS: 6.4 10*3/uL (ref 1.7–7.7)
Neutrophils Relative %: 66 %
PLATELETS: 241 10*3/uL (ref 150–400)
RBC: 5.05 MIL/uL (ref 3.87–5.11)
RDW: 13.4 % (ref 11.5–15.5)
WBC: 9.7 10*3/uL (ref 4.0–10.5)

## 2015-07-19 LAB — CBG MONITORING, ED: Glucose-Capillary: 273 mg/dL — ABNORMAL HIGH (ref 65–99)

## 2015-07-19 MED ORDER — GLIPIZIDE 5 MG PO TABS: 5.0000 mg | ORAL_TABLET | Freq: Two times a day (BID) | ORAL | Status: DC

## 2015-07-19 MED ORDER — HYDRALAZINE HCL 20 MG/ML IJ SOLN
5.0000 mg | Freq: Once | INTRAMUSCULAR | Status: AC
  Administered 2015-07-19: 5 mg via INTRAVENOUS
  Filled 2015-07-19: qty 1

## 2015-07-19 MED ORDER — METFORMIN HCL 1000 MG PO TABS: 1000.0000 mg | ORAL_TABLET | Freq: Two times a day (BID) | ORAL | Status: DC

## 2015-07-19 MED ORDER — LISINOPRIL-HYDROCHLOROTHIAZIDE 20-12.5 MG PO TABS: 1.0000 | ORAL_TABLET | Freq: Every day | ORAL | Status: DC

## 2015-07-19 MED ORDER — GABAPENTIN 300 MG PO CAPS: 300.0000 mg | ORAL_CAPSULE | Freq: Three times a day (TID) | ORAL | Status: DC

## 2015-07-19 NOTE — ED Provider Notes (Signed)
CSN: [id number]     Arrival date & time 07/19/15  1154 History  By signing my name below, I, [facility], attest that this documentation has been prepared under the direction and in the presence of [name], MD. Electronically Signed: Virgel Bouquet, ED Scribe. 07/19/2015. 1:14 PM.   Chief Complaint  Patient presents with  . Numbness    Patient is a 55 y.o. female presenting with weakness. The history is provided by the patient (Patient complains of significant weakness to left arm for 3 months and now has weakness in the right arm with numbness). No language interpreter was used.  Weakness This is a new problem. The current episode started more than 1 week ago. The problem occurs constantly. The problem has not changed since onset.Pertinent negatives include no chest pain, no abdominal pain and no headaches. Nothing aggravates the symptoms. Nothing relieves the symptoms.   HPI Comments: Shannon Price is a 55 y.o. female with a hx of CVA, HTN, and DM who presents to the Emergency Department complaining of constant, moderate, gradually worsening bilateral hand numbness, worse on the left, that began on the left 3 months ago and on the right 3 weeks ago. Pt states that the numbness in her left hand extends up her arm to her elbow with intermittent tingling and that the numbness in her right hand is localized to the palm of her hand and her first three fingers. She reports associated left shoulder pain and mild weakness in the bilateral arms. Daughter reports that the pt has been slurring her speech recently similar to when the pt had a stroke in the past. Shoulder pain is worse with movement. Per daughter, pt has been non-compliant with all medications because she cannot afford to pay for them due to a lack on insurance. Denies recent injuries. Denies HAs currently or any other symptoms currently.  Past Medical History  Diagnosis Date  . Hypertension   . High cholesterol   . Type  II diabetes mellitus ([place])   . [id number]     "weekly" (07/21/2013)  . CVA (cerebral vascular accident) ([place]) 07/2013    "just affected my speech"   Past Surgical History  Procedure Laterality Date  . Cesarean section  1982   Family History  Problem Relation Age of Onset  . Hypertension Mother   . Hypertension Father    Social History  Substance Use Topics  . Smoking status: Current Every Day Smoker -- 2.00 packs/day for 39 years    Types: Cigarettes  . Smokeless tobacco: Never Used  . Alcohol Use: Yes     Comment: occ   OB History    No data available     Review of Systems  Constitutional: Negative for appetite change and fatigue.  HENT: Negative for congestion, ear discharge and sinus pressure.   Eyes: Negative for discharge.  Respiratory: Negative for cough.   Cardiovascular: Negative for chest pain.  Gastrointestinal: Negative for abdominal pain and diarrhea.  Genitourinary: Negative for frequency and hematuria.  Musculoskeletal: Positive for arthralgias (left shoulder). Negative for back pain.  Skin: Negative for rash.  Neurological: Positive for speech difficulty, weakness and numbness. Negative for seizures and headaches.  Psychiatric/Behavioral: Negative for hallucinations.      Allergies  Review of patient's allergies indicates no known allergies.  Home Medications   Prior to Admission medications   Medication Sig Start Date End Date Taking? Authorizing Provider  aspirin 325 MG tablet Take 1 tablet (325 mg total) by mouth  daily. 10/13/14   Rosalin Hawking, MD  atorvastatin (LIPITOR) 20 MG tablet Take 1 tablet (20 mg total) by mouth daily. 10/13/14   Rosalin Hawking, MD  chlorzoxazone (PARAFON) 500 MG tablet Take 1 tablet (500 mg total) by mouth 3 (three) times daily as needed for muscle spasms. Patient not taking: Reported on 10/13/2014 11/03/13   Mikey Kirschner, MD  citalopram (CELEXA) 10 MG tablet Take 1 tablet (10 mg total) by mouth daily. 10/13/14   Rosalin Hawking, MD  folic acid (FOLVITE) 1 MG tablet Take 1 tablet (1 mg total) by mouth daily. Patient not taking: Reported on 10/13/2014 08/05/13   Mikey Kirschner, MD  glipiZIDE (GLUCOTROL) 5 MG tablet Take 1 tablet (5 mg total) by mouth 2 (two) times daily before a meal. 10/13/14   Rosalin Hawking, MD  lisinopril-hydrochlorothiazide (PRINZIDE,ZESTORETIC) 20-12.5 MG tablet Take 1 tablet by mouth daily. 10/13/14   Rosalin Hawking, MD  metFORMIN (GLUCOPHAGE) 1000 MG tablet Take 1 tablet (1,000 mg total) by mouth 2 (two) times daily with a meal. 10/13/14   Rosalin Hawking, MD  rOPINIRole (REQUIP) 0.5 MG tablet Take 1 tablet (0.5 mg total) by mouth at bedtime. 11/11/13   Rosalin Hawking, MD  zolpidem (AMBIEN) 10 MG tablet Take 1 tablet (10 mg total) by mouth at bedtime as needed for sleep. 11/03/13 12/03/13  Mikey Kirschner, MD   BP 204/120 mmHg  Pulse 109  Temp(Src) 97.8 F (36.6 C) (Oral)  Resp 18  Ht 5\' 5"  (1.651 m)  Wt 260 lb (117.935 kg)  BMI 43.27 kg/m2  SpO2 100% Physical Exam  Constitutional: She is oriented to person, place, and time. She appears well-developed.  HENT:  Head: Normocephalic.  Eyes: Conjunctivae and EOM are normal. No scleral icterus.  Neck: Neck supple. No thyromegaly present.  Cardiovascular: Normal rate and regular rhythm.  Exam reveals no gallop and no friction rub.   No murmur heard. Pulmonary/Chest: No stridor. She has no wheezes. She has no rales. She exhibits no tenderness.  Abdominal: She exhibits no distension. There is no tenderness. There is no rebound.  Musculoskeletal: Normal range of motion. She exhibits no edema.  Lymphadenopathy:    She has no cervical adenopathy.  Neurological: She is oriented to person, place, and time. She exhibits normal muscle tone. Coordination normal.  MIld weakness in BUE, worse on the left.  Skin: No rash noted. No erythema.  Psychiatric: She has a normal mood and affect. Her behavior is normal.    ED Course  Procedures (including critical care  time)  DIAGNOSTIC STUDIES: Oxygen Saturation is 100% on RA, normal by my interpretation.    COORDINATION OF CARE: 12:47 PM Will order hydralazine, MRIs of C-spine and brain, and labs. Discussed treatment plan with pt at bedside and pt agreed to plan.   Labs Review Labs Reviewed  CBC WITH DIFFERENTIAL/PLATELET - Abnormal; Notable for the following:    Hemoglobin 15.4 (*)    HCT 46.4 (*)    All other components within normal limits  CBG MONITORING, ED - Abnormal; Notable for the following:    Glucose-Capillary 273 (*)    All other components within normal limits  COMPREHENSIVE METABOLIC PANEL    Imaging Review No results found. I have personally reviewed and evaluated these images and lab results as part of my medical decision-making.   EKG Interpretation None      MDM   Final diagnoses:  None    MRI shows cervical disc. This is protruding and  causing the numbness in her arms. I spoke with the neurosurgeon who will follow-up in 2-3 weeks. She will be started on [email or web address]   The chart was scribed for me under my direct supervision.  I personally performed the history, physical, and medical decision making and all procedures in the evaluation of this patient.[name], MD 07/19/15 [id number]

## 2015-07-19 NOTE — ED Notes (Addendum)
Pt c/o bilateral hand numbness. States the LT hand has been going on for 3 months. The RT hand for 3 weeks. Pt has no other neurological deficits. Pt denies hx of neuropathy. Pt hypertensive in triage. States she has been on BP medications for months.

## 2015-07-19 NOTE — ED Notes (Signed)
cbg 273 in triage

## 2015-07-19 NOTE — ED Notes (Signed)
Pt made aware to return if symptoms worsen or if any life threatening symptoms occur.   

## 2015-07-19 NOTE — ED Notes (Signed)
Pt taken to MRI  

## 2015-07-19 NOTE — Discharge Instructions (Signed)
Follow-up with Dr. Cyndy Freeze and one 2 weeks for your neck. Follow-up with Dr. Aline Brochure for your shoulder

## 2015-07-29 ENCOUNTER — Other Ambulatory Visit: Payer: Self-pay | Admitting: Neurological Surgery

## 2015-08-02 ENCOUNTER — Ambulatory Visit (INDEPENDENT_AMBULATORY_CARE_PROVIDER_SITE_OTHER): Payer: BLUE CROSS/BLUE SHIELD | Admitting: Family Medicine

## 2015-08-02 ENCOUNTER — Encounter: Payer: Self-pay | Admitting: Family Medicine

## 2015-08-02 VITALS — BP 148/88 | Ht 65.0 in | Wt 253.4 lb

## 2015-08-02 DIAGNOSIS — I63312 Cerebral infarction due to thrombosis of left middle cerebral artery: Secondary | ICD-10-CM | POA: Diagnosis not present

## 2015-08-02 DIAGNOSIS — E785 Hyperlipidemia, unspecified: Secondary | ICD-10-CM

## 2015-08-02 DIAGNOSIS — I10 Essential (primary) hypertension: Secondary | ICD-10-CM | POA: Diagnosis not present

## 2015-08-02 DIAGNOSIS — I639 Cerebral infarction, unspecified: Secondary | ICD-10-CM

## 2015-08-02 DIAGNOSIS — E119 Type 2 diabetes mellitus without complications: Secondary | ICD-10-CM | POA: Diagnosis not present

## 2015-08-02 DIAGNOSIS — Z79899 Other long term (current) drug therapy: Secondary | ICD-10-CM

## 2015-08-02 DIAGNOSIS — I6381 Other cerebral infarction due to occlusion or stenosis of small artery: Secondary | ICD-10-CM

## 2015-08-02 LAB — POCT GLYCOSYLATED HEMOGLOBIN (HGB A1C): Hemoglobin A1C: 7.2

## 2015-08-02 MED ORDER — VERAPAMIL HCL ER 240 MG PO TBCR: 240.0000 mg | EXTENDED_RELEASE_TABLET | ORAL | 5 refills | Status: DC

## 2015-08-02 MED ORDER — GABAPENTIN 300 MG PO CAPS: 300.0000 mg | ORAL_CAPSULE | Freq: Three times a day (TID) | ORAL | 0 refills | Status: DC

## 2015-08-02 MED ORDER — LISINOPRIL-HYDROCHLOROTHIAZIDE 20-12.5 MG PO TABS: 1.0000 | ORAL_TABLET | Freq: Every day | ORAL | 5 refills | Status: DC

## 2015-08-02 MED ORDER — METFORMIN HCL 1000 MG PO TABS: 1000.0000 mg | ORAL_TABLET | Freq: Two times a day (BID) | ORAL | 5 refills | Status: DC

## 2015-08-02 MED ORDER — ATORVASTATIN CALCIUM 20 MG PO TABS: 20.0000 mg | ORAL_TABLET | Freq: Every day | ORAL | 5 refills | Status: DC

## 2015-08-02 MED ORDER — GLIPIZIDE 5 MG PO TABS: 5.0000 mg | ORAL_TABLET | Freq: Two times a day (BID) | ORAL | 5 refills | Status: DC

## 2015-08-02 NOTE — Progress Notes (Signed)
   Subjective:    Patient ID: Shannon Price, female    DOB: February 24, 1960, 55 y.o.   MRN: [id number] Patient arrives office after 2 year hiatus due to no insurance     HPI Patient arrives to recheck he diabetes and blood pressure -patient has not had insurance for a long time and has not been to dr or taken meds for a while-just restarted meds. Patient also having back and hand issues and is scheduled for surgery next week if sugars are good   Results for orders placed or performed in visit on 08/02/15  POCT glycosylated hemoglobin (Hb A1C)  Result Value Ref Range   Hemoglobin A1C 7.2    Next tue serv pain  Both hands numb. Mri revealed severe disc disease  Dr ditty  back on diab med since the hosp visit   bp med back on . Chest for the past month. Have been off of it for a couple years.  History of lacunar stroke.no recent neurological symptoms. Next  Unfortunately patient continues to smoke. Some shortness of breath with exertion. Next  Unfortunately patient not checking her sugar.  Compliant with blood pressure medicine at this time watching salt intake  Off meds couple yrs  Stomach issues    Hands asleep no major pain, Review of Systems No headache, no major weight loss or weight gain, no chest pain no back pain abdominal pain no change in bowel habits complete ROS otherwise negative     Objective:   Physical Exam  Alert vital stable blood pressure still 152/90 on repeat obesity present lungs diminished breath sounds no acute bruising heart regular rhythm ankles without edema no focal neurological deficit      Assessment & Plan:  Impression 1 hypertension control not good discuss #2 hyperlipidemia with noncompliance with lipid medicine until recently due to insurance realities #3 status post stroke or 4 chronic smoker spine encourage quit #5 type 2 diabetes controlled suboptimum but surprisingly reasonable considering off meds for long time plan medications  refilled. Diet exercise discussed. Appropriate blood work. Recheck in 6 weeks WSL

## 2015-08-05 ENCOUNTER — Encounter (HOSPITAL_COMMUNITY): Payer: Self-pay

## 2015-08-05 ENCOUNTER — Encounter (HOSPITAL_COMMUNITY)
Admission: RE | Admit: 2015-08-05 | Discharge: 2015-08-05 | Disposition: A | Discharge status: Home / Self Care | Payer: BLUE CROSS/BLUE SHIELD | Source: Ambulatory Visit | Attending: Neurological Surgery | Admitting: Neurological Surgery

## 2015-08-05 DIAGNOSIS — M4712 Other spondylosis with myelopathy, cervical region: Secondary | ICD-10-CM | POA: Insufficient documentation

## 2015-08-05 DIAGNOSIS — Z01812 Encounter for preprocedural laboratory examination: Secondary | ICD-10-CM | POA: Diagnosis not present

## 2015-08-05 DIAGNOSIS — Z0183 Encounter for blood typing: Secondary | ICD-10-CM | POA: Insufficient documentation

## 2015-08-05 HISTORY — DX: Anxiety disorder, unspecified: F41.9

## 2015-08-05 LAB — CBC
HCT: 43.8 % (ref 36.0–46.0)
Hemoglobin: 14.5 g/dL (ref 12.0–15.0)
MCH: 30.5 pg (ref 26.0–34.0)
MCHC: 33.1 g/dL (ref 30.0–36.0)
MCV: 92 fL (ref 78.0–100.0)
PLATELETS: 276 10*3/uL (ref 150–400)
RBC: 4.76 MIL/uL (ref 3.87–5.11)
RDW: 13.3 % (ref 11.5–15.5)
WBC: 10.2 10*3/uL (ref 4.0–10.5)

## 2015-08-05 LAB — BASIC METABOLIC PANEL
Anion gap: 10 (ref 5–15)
BUN: 7 mg/dL (ref 6–20)
CALCIUM: 9.6 mg/dL (ref 8.9–10.3)
CO2: 23 mmol/L (ref 22–32)
Chloride: 100 mmol/L — ABNORMAL LOW (ref 101–111)
Creatinine, Ser: 0.63 mg/dL (ref 0.44–1.00)
GFR calc Af Amer: >60 mL/min (ref >60)
GLUCOSE: 106 mg/dL — AB (ref 65–99)
POTASSIUM: 3.8 mmol/L (ref 3.5–5.1)
Sodium: 133 mmol/L — ABNORMAL LOW (ref 135–145)

## 2015-08-05 LAB — SURGICAL PCR SCREEN
MRSA, PCR: NEGATIVE
Staphylococcus aureus: POSITIVE — AB

## 2015-08-05 LAB — TYPE AND SCREEN
ABO/RH(D): A POS
ANTIBODY SCREEN: NEGATIVE

## 2015-08-05 LAB — GLUCOSE, CAPILLARY: Glucose-Capillary: 122 mg/dL — ABNORMAL HIGH (ref 65–99)

## 2015-08-05 LAB — ABO/RH: ABO/RH(D): A POS

## 2015-08-05 NOTE — Pre-Procedure Instructions (Signed)
Shannon Price  08/05/2015      [facility] - [place], [place] - [id number] [place] [address] Phone: [phone] Fax: [phone]    Your procedure is scheduled on  Tuesday  08/09/15  Report to [facility] Admitting at 530 A.M.  Call this number if you have problems the morning of surgery:  [phone]   Remember:  Do not eat food or drink liquids after midnight.  Take these medicines the morning of surgery with A SIP OF WATER   CITALOPRAM (CELEXA), GABAPENTIN (NEURONTIN), VERAPMAMIL (CALAN)     (STOP NOW ASPIRIN, BC HEADACHE POWDER, IBUPROFEN/ ADVIL/ MOTRIN, GOODY POWDERS, HERBAL MEDICINES)    How to Manage Your Diabetes Before and After Surgery  Why is it important to control my blood sugar before and after surgery? . Improving blood sugar levels before and after surgery helps healing and can limit problems. . A way of improving blood sugar control is eating a healthy diet by: o  Eating less sugar and carbohydrates o  Increasing activity/exercise o  Talking with your doctor about reaching your blood sugar goals . High blood sugars (greater than 180 mg/dL) can raise your risk of infections and slow your recovery, so you will need to focus on controlling your diabetes during the weeks before surgery. . Make sure that the doctor who takes care of your diabetes knows about your planned surgery including the date and location.  How do I manage my blood sugar before surgery? . Check your blood sugar at least 4 times a day, starting 2 days before surgery, to make sure that the level is not too high or low. o Check your blood sugar the morning of your surgery when you wake up and every 2 hours until you get to the Short Stay unit. . If your blood sugar is less than 70 mg/dL, you will need to treat for low blood sugar: o Do not take insulin. o Treat a low blood sugar (less than 70 mg/dL) with  cup of clear juice (cranberry or  apple), 4 glucose tablets, OR glucose gel. o Recheck blood sugar in 15 minutes after treatment (to make sure it is greater than 70 mg/dL). If your blood sugar is not greater than 70 mg/dL on recheck, call [phone] for further instructions. . Report your blood sugar to the short stay nurse when you get to Short Stay.  . If you are admitted to the hospital after surgery: o Your blood sugar will be checked by the staff and you will probably be given insulin after surgery (instead of oral diabetes medicines) to make sure you have good blood sugar levels. o The goal for blood sugar control after surgery is 80-180 mg/dL.              WHAT DO I DO ABOUT MY DIABETES MEDICATION?   Marland Kitchen Do not take oral diabetes medicines (pills) the morning of surgery..   Other Instructions:          Patient Signature:  Date:   Nurse Signature:  Date:   Reviewed and Endorsed by [facility] Patient Education Committee, August 2015  Do not wear jewelry, make-up or nail polish.  Do not wear lotions, powders, or perfumes.  You may wear deoderant.  Do not shave 48 hours prior to surgery.  Men may shave face and neck.  Do not bring valuables to the hospital.  [facility] is not responsible for  any belongings or valuables.  Contacts, dentures or bridgework may not be worn into surgery.  Leave your suitcase in the car.  After surgery it may be brought to your room.  For patients admitted to the hospital, discharge time will be determined by your treatment team.  Patients discharged the day of surgery will not be allowed to drive home.   Name and phone number of your driver:   Special instructions:  SEE PREPARING FOR SURGERY  Please read over the following fact sheets that you were given. Pain Booklet, MRSA Information and Surgical Site Infection Prevention

## 2015-08-08 NOTE — Anesthesia Preprocedure Evaluation (Addendum)
Anesthesia Evaluation    Reviewed: Allergy & Precautions, NPO status , Patient's Chart, lab work & pertinent test results  History of Anesthesia Complications Negative for: history of anesthetic complications  Airway Mallampati: III  TM Distance: >3 FB Neck ROM: Limited    Dental  (+) Dental Advisory Given, Poor Dentition,    Pulmonary shortness of breath and with exertion, Current Smoker,    Pulmonary exam normal        Cardiovascular Exercise Tolerance: Poor hypertension, Pt. on medications Normal cardiovascular exam     Neuro/Psych  Headaches, PSYCHIATRIC DISORDERS Anxiety  Residual symptoms affect speech  Numbness in both arms/hands  Weakness in left arm CVA, Residual Symptoms    GI/Hepatic (+)     substance abuse  alcohol use,   Endo/Other  diabetes, Poorly Controlled, Type 2, Oral Hypoglycemic AgentsMorbid obesity  Renal/GU      Musculoskeletal   Abdominal (+) + obese,   Peds  Hematology   Anesthesia Other Findings   Reproductive/Obstetrics                        BP Readings from Last 3 Encounters:  08/05/15 (!) (P) 158/83  08/02/15 (!) 148/88  07/19/15 159/100   Lab Results  Component Value Date   WBC 10.2 08/05/2015   HGB 14.5 08/05/2015   HCT 43.8 08/05/2015   MCV 92.0 08/05/2015   PLT 276 08/05/2015     Chemistry      Component Value Date/Time   NA 133 (L) 08/05/2015 1628   K 3.8 08/05/2015 1628   CL 100 (L) 08/05/2015 1628   CO2 23 08/05/2015 1628   BUN 7 08/05/2015 1628   CREATININE 0.63 08/05/2015 1628      Component Value Date/Time   CALCIUM 9.6 08/05/2015 1628   ALKPHOS 94 07/19/2015 1315   AST 17 07/19/2015 1315   ALT 24 07/19/2015 1315   BILITOT 0.5 07/19/2015 1315     Lab Results  Component Value Date   INR 0.97 07/21/2013   Lab Results  Component Value Date   HGBA1C 7.2 08/02/2015    EKG 07/19/15: Low voltage QRS Borderline ECG No  significant change since last tracing Confirmed by Rogene Houston MD, SCOTT  Anesthesia Physical Anesthesia Plan  ASA: III  Anesthesia Plan: General   Post-op Pain Management:    Induction: Intravenous  Airway Management Planned: Oral ETT  Additional Equipment:   Intra-op Plan:   Post-operative Plan: Extubation in OR  Informed Consent:   Plan Discussed with:   Anesthesia Plan Comments:         Anesthesia Quick Evaluation

## 2015-08-09 ENCOUNTER — Encounter (HOSPITAL_COMMUNITY): Payer: Self-pay | Admitting: Certified Registered Nurse Anesthetist

## 2015-08-09 ENCOUNTER — Ambulatory Visit (HOSPITAL_COMMUNITY): Payer: BLUE CROSS/BLUE SHIELD | Admitting: Certified Registered Nurse Anesthetist

## 2015-08-09 ENCOUNTER — Ambulatory Visit (HOSPITAL_COMMUNITY)
Admission: RE | Admit: 2015-08-09 | Discharge: 2015-08-09 | Disposition: A | Discharge status: Home / Self Care | Payer: BLUE CROSS/BLUE SHIELD | Source: Ambulatory Visit | Attending: Neurological Surgery | Admitting: Neurological Surgery

## 2015-08-09 ENCOUNTER — Encounter (HOSPITAL_COMMUNITY)
Admission: RE | Disposition: A | Discharge status: Home / Self Care | Payer: Self-pay | Source: Ambulatory Visit | Attending: Neurological Surgery

## 2015-08-09 ENCOUNTER — Ambulatory Visit (HOSPITAL_COMMUNITY): Payer: BLUE CROSS/BLUE SHIELD

## 2015-08-09 DIAGNOSIS — Z6841 Body Mass Index (BMI) 40.0 and over, adult: Secondary | ICD-10-CM | POA: Insufficient documentation

## 2015-08-09 DIAGNOSIS — Z8673 Personal history of transient ischemic attack (TIA), and cerebral infarction without residual deficits: Secondary | ICD-10-CM | POA: Diagnosis not present

## 2015-08-09 DIAGNOSIS — Z7984 Long term (current) use of oral hypoglycemic drugs: Secondary | ICD-10-CM | POA: Diagnosis not present

## 2015-08-09 DIAGNOSIS — Z419 Encounter for procedure for purposes other than remedying health state, unspecified: Secondary | ICD-10-CM

## 2015-08-09 DIAGNOSIS — Z7982 Long term (current) use of aspirin: Secondary | ICD-10-CM | POA: Diagnosis not present

## 2015-08-09 DIAGNOSIS — E1165 Type 2 diabetes mellitus with hyperglycemia: Secondary | ICD-10-CM | POA: Diagnosis not present

## 2015-08-09 DIAGNOSIS — M4712 Other spondylosis with myelopathy, cervical region: Secondary | ICD-10-CM | POA: Diagnosis not present

## 2015-08-09 DIAGNOSIS — Z79899 Other long term (current) drug therapy: Secondary | ICD-10-CM | POA: Diagnosis not present

## 2015-08-09 DIAGNOSIS — F419 Anxiety disorder, unspecified: Secondary | ICD-10-CM | POA: Diagnosis not present

## 2015-08-09 DIAGNOSIS — I10 Essential (primary) hypertension: Secondary | ICD-10-CM | POA: Diagnosis not present

## 2015-08-09 DIAGNOSIS — F17210 Nicotine dependence, cigarettes, uncomplicated: Secondary | ICD-10-CM | POA: Diagnosis not present

## 2015-08-09 HISTORY — PX: ANTERIOR CERVICAL DECOMP/DISCECTOMY FUSION: SHX1161

## 2015-08-09 LAB — GLUCOSE, CAPILLARY
GLUCOSE-CAPILLARY: 174 mg/dL — AB (ref 65–99)
GLUCOSE-CAPILLARY: 240 mg/dL — AB (ref 65–99)
Glucose-Capillary: 194 mg/dL — ABNORMAL HIGH (ref 65–99)
Glucose-Capillary: 228 mg/dL — ABNORMAL HIGH (ref 65–99)

## 2015-08-09 SURGERY — ANTERIOR CERVICAL DECOMPRESSION/DISCECTOMY FUSION 2 LEVELS
Anesthesia: General | Site: Spine Cervical

## 2015-08-09 MED ORDER — LISINOPRIL-HYDROCHLOROTHIAZIDE 20-12.5 MG PO TABS: 1.0000 | ORAL_TABLET | Freq: Every day | ORAL | Status: DC

## 2015-08-09 MED ORDER — DEXAMETHASONE SODIUM PHOSPHATE 10 MG/ML IJ SOLN
INTRAMUSCULAR | Status: DC | PRN
  Administered 2015-08-09: 6 mg via INTRAVENOUS

## 2015-08-09 MED ORDER — PHENOL 1.4 % MT LIQD: 1.0000 | OROMUCOSAL | Status: DC | PRN

## 2015-08-09 MED ORDER — FENTANYL CITRATE (PF) 250 MCG/5ML IJ SOLN
INTRAMUSCULAR | Status: AC
  Filled 2015-08-09: qty 5

## 2015-08-09 MED ORDER — GABAPENTIN 300 MG PO CAPS
300.0000 mg | ORAL_CAPSULE | Freq: Three times a day (TID) | ORAL | Status: DC
  Administered 2015-08-09: 300 mg via ORAL
  Filled 2015-08-09: qty 1

## 2015-08-09 MED ORDER — HYDROCODONE-ACETAMINOPHEN 7.5-325 MG PO TABS: 1.0000 | ORAL_TABLET | Freq: Four times a day (QID) | ORAL | 0 refills | Status: DC | PRN

## 2015-08-09 MED ORDER — THROMBIN 5000 UNITS EX SOLR
CUTANEOUS | Status: DC | PRN
  Administered 2015-08-09 (×2): 5000 [IU] via TOPICAL

## 2015-08-09 MED ORDER — FOLIC ACID 1 MG PO TABS: 1.0000 mg | ORAL_TABLET | Freq: Every day | ORAL | Status: DC

## 2015-08-09 MED ORDER — BUPIVACAINE-EPINEPHRINE (PF) 0.5% -1:200000 IJ SOLN
INTRAMUSCULAR | Status: DC | PRN
  Administered 2015-08-09: 5 mL

## 2015-08-09 MED ORDER — ONDANSETRON HCL 4 MG/2ML IJ SOLN
INTRAMUSCULAR | Status: DC | PRN
  Administered 2015-08-09: 4 mg via INTRAVENOUS

## 2015-08-09 MED ORDER — LIDOCAINE 2% (20 MG/ML) 5 ML SYRINGE
INTRAMUSCULAR | Status: AC
  Filled 2015-08-09: qty 5

## 2015-08-09 MED ORDER — CHLORHEXIDINE GLUCONATE CLOTH 2 % EX PADS: 6.0000 | MEDICATED_PAD | Freq: Once | CUTANEOUS | Status: DC

## 2015-08-09 MED ORDER — CITALOPRAM HYDROBROMIDE 10 MG PO TABS: 10.0000 mg | ORAL_TABLET | Freq: Every day | ORAL | Status: DC

## 2015-08-09 MED ORDER — MUPIROCIN 2 % EX OINT
TOPICAL_OINTMENT | CUTANEOUS | Status: AC
  Administered 2015-08-09: 06:00:00 via NASAL
  Filled 2015-08-09: qty 22

## 2015-08-09 MED ORDER — ZOLPIDEM TARTRATE 5 MG PO TABS: 5.0000 mg | ORAL_TABLET | Freq: Every evening | ORAL | Status: DC | PRN

## 2015-08-09 MED ORDER — SODIUM CHLORIDE 0.9% FLUSH: 3.0000 mL | INTRAVENOUS | Status: DC | PRN

## 2015-08-09 MED ORDER — METFORMIN HCL 500 MG PO TABS
1000.0000 mg | ORAL_TABLET | Freq: Two times a day (BID) | ORAL | Status: DC
  Filled 2015-08-09: qty 2

## 2015-08-09 MED ORDER — MENTHOL 3 MG MT LOZG: 1.0000 | LOZENGE | OROMUCOSAL | Status: DC | PRN

## 2015-08-09 MED ORDER — ROCURONIUM BROMIDE 50 MG/5ML IV SOLN
INTRAVENOUS | Status: AC
  Filled 2015-08-09: qty 1

## 2015-08-09 MED ORDER — LISINOPRIL 20 MG PO TABS: 20.0000 mg | ORAL_TABLET | Freq: Every day | ORAL | Status: DC

## 2015-08-09 MED ORDER — METHOCARBAMOL 750 MG PO TABS
750.0000 mg | ORAL_TABLET | Freq: Four times a day (QID) | ORAL | Status: DC
  Administered 2015-08-09: 750 mg via ORAL
  Filled 2015-08-09: qty 1

## 2015-08-09 MED ORDER — FENTANYL CITRATE (PF) 100 MCG/2ML IJ SOLN
INTRAMUSCULAR | Status: DC | PRN
  Administered 2015-08-09 (×4): 50 ug via INTRAVENOUS
  Administered 2015-08-09: 100 ug via INTRAVENOUS
  Administered 2015-08-09 (×2): 50 ug via INTRAVENOUS

## 2015-08-09 MED ORDER — CELECOXIB 200 MG PO CAPS
200.0000 mg | ORAL_CAPSULE | Freq: Two times a day (BID) | ORAL | Status: DC
  Administered 2015-08-09: 200 mg via ORAL
  Filled 2015-08-09: qty 1

## 2015-08-09 MED ORDER — BISACODYL 10 MG RE SUPP: 10.0000 mg | Freq: Every day | RECTAL | Status: DC | PRN

## 2015-08-09 MED ORDER — SODIUM CHLORIDE 0.9% FLUSH: 3.0000 mL | Freq: Two times a day (BID) | INTRAVENOUS | Status: DC

## 2015-08-09 MED ORDER — SENNA 8.6 MG PO TABS: 1.0000 | ORAL_TABLET | Freq: Two times a day (BID) | ORAL | Status: DC

## 2015-08-09 MED ORDER — CHLORZOXAZONE 500 MG PO TABS
500.0000 mg | ORAL_TABLET | Freq: Three times a day (TID) | ORAL | Status: DC | PRN
  Filled 2015-08-09: qty 1

## 2015-08-09 MED ORDER — SUGAMMADEX SODIUM 500 MG/5ML IV SOLN
INTRAVENOUS | Status: AC
  Filled 2015-08-09: qty 5

## 2015-08-09 MED ORDER — OXYCODONE HCL 5 MG PO TABS
5.0000 mg | ORAL_TABLET | ORAL | Status: DC | PRN
  Administered 2015-08-09: 5 mg via ORAL

## 2015-08-09 MED ORDER — CEFAZOLIN IN D5W 1 GM/50ML IV SOLN
1.0000 g | Freq: Three times a day (TID) | INTRAVENOUS | Status: DC
  Administered 2015-08-09: 1 g via INTRAVENOUS
  Filled 2015-08-09: qty 50

## 2015-08-09 MED ORDER — HYDROMORPHONE HCL 1 MG/ML IJ SOLN
INTRAMUSCULAR | Status: AC
  Administered 2015-08-09: 0.5 mg via INTRAVENOUS
  Filled 2015-08-09: qty 1

## 2015-08-09 MED ORDER — ATORVASTATIN CALCIUM 20 MG PO TABS: 20.0000 mg | ORAL_TABLET | Freq: Every day | ORAL | Status: DC

## 2015-08-09 MED ORDER — VERAPAMIL HCL ER 240 MG PO TBCR: 240.0000 mg | EXTENDED_RELEASE_TABLET | ORAL | Status: DC

## 2015-08-09 MED ORDER — DEXAMETHASONE SODIUM PHOSPHATE 10 MG/ML IJ SOLN
INTRAMUSCULAR | Status: AC
  Filled 2015-08-09: qty 1

## 2015-08-09 MED ORDER — LIDOCAINE HCL (CARDIAC) 20 MG/ML IV SOLN
INTRAVENOUS | Status: DC | PRN
  Administered 2015-08-09: 60 mg via INTRAVENOUS

## 2015-08-09 MED ORDER — ONDANSETRON HCL 4 MG/2ML IJ SOLN: 4.0000 mg | INTRAMUSCULAR | Status: DC | PRN

## 2015-08-09 MED ORDER — LACTATED RINGERS IV SOLN
INTRAVENOUS | Status: DC | PRN
  Administered 2015-08-09: 07:00:00 via INTRAVENOUS

## 2015-08-09 MED ORDER — CEFAZOLIN SODIUM-DEXTROSE 2-4 GM/100ML-% IV SOLN
INTRAVENOUS | Status: AC
  Filled 2015-08-09: qty 100

## 2015-08-09 MED ORDER — ONDANSETRON HCL 4 MG/2ML IJ SOLN
INTRAMUSCULAR | Status: AC
  Filled 2015-08-09: qty 2

## 2015-08-09 MED ORDER — DEXAMETHASONE SODIUM PHOSPHATE 4 MG/ML IJ SOLN
2.0000 mg | Freq: Three times a day (TID) | INTRAMUSCULAR | Status: DC
  Administered 2015-08-09: 2 mg via INTRAVENOUS
  Filled 2015-08-09: qty 1

## 2015-08-09 MED ORDER — CEFAZOLIN SODIUM-DEXTROSE 2-4 GM/100ML-% IV SOLN
2.0000 g | INTRAVENOUS | Status: AC
  Administered 2015-08-09: 2 g via INTRAVENOUS

## 2015-08-09 MED ORDER — FLEET ENEMA 7-19 GM/118ML RE ENEM
1.0000 | ENEMA | Freq: Once | RECTAL | Status: DC | PRN
Start: 2015-08-09 — End: 2015-08-09

## 2015-08-09 MED ORDER — DOCUSATE SODIUM 100 MG PO CAPS: 100.0000 mg | ORAL_CAPSULE | Freq: Two times a day (BID) | ORAL | Status: DC

## 2015-08-09 MED ORDER — GLIPIZIDE 5 MG PO TABS
5.0000 mg | ORAL_TABLET | Freq: Two times a day (BID) | ORAL | Status: DC
  Filled 2015-08-09: qty 1

## 2015-08-09 MED ORDER — MIDAZOLAM HCL 2 MG/2ML IJ SOLN
INTRAMUSCULAR | Status: AC
  Filled 2015-08-09: qty 2

## 2015-08-09 MED ORDER — LIDOCAINE-EPINEPHRINE 2 %-1:100000 IJ SOLN
INTRAMUSCULAR | Status: DC | PRN
  Administered 2015-08-09: 5 mL

## 2015-08-09 MED ORDER — 0.9 % SODIUM CHLORIDE (POUR BTL) OPTIME
TOPICAL | Status: DC | PRN
  Administered 2015-08-09: 1000 mL

## 2015-08-09 MED ORDER — OXYCODONE HCL 5 MG PO TABS
ORAL_TABLET | ORAL | Status: DC
Start: 2015-08-09 — End: 2015-08-09
  Filled 2015-08-09: qty 3

## 2015-08-09 MED ORDER — LACTATED RINGERS IV SOLN
INTRAVENOUS | Status: DC | PRN
  Administered 2015-08-09 (×2): via INTRAVENOUS

## 2015-08-09 MED ORDER — HYDROCHLOROTHIAZIDE 12.5 MG PO CAPS: 12.5000 mg | ORAL_CAPSULE | Freq: Every day | ORAL | Status: DC

## 2015-08-09 MED ORDER — SUGAMMADEX SODIUM 500 MG/5ML IV SOLN
INTRAVENOUS | Status: DC | PRN
  Administered 2015-08-09: 240 mg via INTRAVENOUS

## 2015-08-09 MED ORDER — MIDAZOLAM HCL 5 MG/5ML IJ SOLN
INTRAMUSCULAR | Status: DC | PRN
  Administered 2015-08-09: 2 mg via INTRAVENOUS

## 2015-08-09 MED ORDER — PHENYLEPHRINE HCL 10 MG/ML IJ SOLN
INTRAVENOUS | Status: DC | PRN
  Administered 2015-08-09: 25 ug/min via INTRAVENOUS

## 2015-08-09 MED ORDER — LACTATED RINGERS IV SOLN: INTRAVENOUS | Status: DC

## 2015-08-09 MED ORDER — THROMBIN 5000 UNITS EX SOLR
OROMUCOSAL | Status: DC | PRN
  Administered 2015-08-09 (×2): 5 mL via TOPICAL

## 2015-08-09 MED ORDER — SODIUM CHLORIDE 0.9 % IR SOLN
Status: DC | PRN
  Administered 2015-08-09: 500 mL

## 2015-08-09 MED ORDER — PROPOFOL 10 MG/ML IV BOLUS
INTRAVENOUS | Status: AC
  Filled 2015-08-09: qty 20

## 2015-08-09 MED ORDER — SODIUM CHLORIDE 0.9 % IV SOLN
INTRAVENOUS | Status: DC
Start: 2015-08-09 — End: 2015-08-09

## 2015-08-09 MED ORDER — SODIUM CHLORIDE 0.9 % IV SOLN: 250.0000 mL | INTRAVENOUS | Status: DC

## 2015-08-09 MED ORDER — METHOCARBAMOL 750 MG PO TABS: 750.0000 mg | ORAL_TABLET | Freq: Four times a day (QID) | ORAL | 2 refills | Status: DC

## 2015-08-09 MED ORDER — PROPOFOL 10 MG/ML IV BOLUS
INTRAVENOUS | Status: DC | PRN
  Administered 2015-08-09: 150 mg via INTRAVENOUS
  Administered 2015-08-09: 50 mg via INTRAVENOUS

## 2015-08-09 MED ORDER — ROPINIROLE HCL 1 MG PO TABS: 0.5000 mg | ORAL_TABLET | Freq: Every day | ORAL | Status: DC

## 2015-08-09 MED ORDER — HEMOSTATIC AGENTS (NO CHARGE) OPTIME
TOPICAL | Status: DC | PRN
  Administered 2015-08-09: 1 via TOPICAL

## 2015-08-09 MED ORDER — ROCURONIUM BROMIDE 100 MG/10ML IV SOLN
INTRAVENOUS | Status: DC | PRN
  Administered 2015-08-09 (×4): 10 mg via INTRAVENOUS
  Administered 2015-08-09: 5 mg via INTRAVENOUS
  Administered 2015-08-09: 10 mg via INTRAVENOUS
  Administered 2015-08-09: 50 mg via INTRAVENOUS
  Administered 2015-08-09: 10 mg via INTRAVENOUS

## 2015-08-09 MED ORDER — HYDROMORPHONE HCL 1 MG/ML IJ SOLN
0.2500 mg | INTRAMUSCULAR | Status: DC | PRN
  Administered 2015-08-09 (×2): 0.5 mg via INTRAVENOUS

## 2015-08-09 MED ORDER — ACETAMINOPHEN 500 MG PO TABS
1000.0000 mg | ORAL_TABLET | Freq: Four times a day (QID) | ORAL | Status: DC
  Administered 2015-08-09: 1000 mg via ORAL
  Filled 2015-08-09: qty 2

## 2015-08-09 SURGICAL SUPPLY — 72 items
APPLICATOR CHLORAPREP 3ML ORNG (MISCELLANEOUS) ×3 IMPLANT
BIT DRILL NEURO 2X3.1 SFT TUCH (MISCELLANEOUS) ×1 IMPLANT
BIT DRILL POWER (BIT) ×1 IMPLANT
BLADE SURG 11 STRL SS (BLADE) ×3 IMPLANT
BLADE ULTRA TIP 2M (BLADE) IMPLANT
BONE MATRIX OSTEOCEL PRO SM (Bone Implant) ×6 IMPLANT
BUR MATCHSTICK NEURO 3.0 LAGG (BURR) ×3 IMPLANT
CAGE COROENT SM 8X17X14-10 (Cage) ×2 IMPLANT
CAGE COROENT SM 8X17X14MM-10 (Cage) ×1 IMPLANT
CAGE COROENT SM ACR 7X17X14 (Cage) ×2 IMPLANT
CAGE COROENT SM ACR 7X17X14MM (Cage) ×1 IMPLANT
CANISTER SUCT 3000ML PPV (MISCELLANEOUS) ×3 IMPLANT
DECANTER SPIKE VIAL GLASS SM (MISCELLANEOUS) ×3 IMPLANT
DERMABOND ADVANCED (GAUZE/BANDAGES/DRESSINGS) ×2
DERMABOND ADVANCED .7 DNX12 (GAUZE/BANDAGES/DRESSINGS) ×1 IMPLANT
DRAPE C-ARM 42X72 X-RAY (DRAPES) ×6 IMPLANT
DRAPE C-ARMOR (DRAPES) IMPLANT
DRAPE HALF SHEET 40X57 (DRAPES) IMPLANT
DRAPE LAPAROTOMY 100X72 PEDS (DRAPES) ×3 IMPLANT
DRAPE MICROSCOPE LEICA (MISCELLANEOUS) ×3 IMPLANT
DRAPE POUCH INSTRU U-SHP 10X18 (DRAPES) ×3 IMPLANT
DRAPE SHEET LG 3/4 BI-LAMINATE (DRAPES) ×3 IMPLANT
DRILL BIT POWER (BIT) ×2
DRILL NEURO 2X3.1 SOFT TOUCH (MISCELLANEOUS) ×3
DRSG OPSITE POSTOP 4X6 (GAUZE/BANDAGES/DRESSINGS) ×3 IMPLANT
ELECT COATED BLADE 2.86 ST (ELECTRODE) ×3 IMPLANT
ELECT REM PT RETURN 9FT ADLT (ELECTROSURGICAL) ×3
ELECTRODE REM PT RTRN 9FT ADLT (ELECTROSURGICAL) ×1 IMPLANT
EVACUATOR 1/8 PVC DRAIN (DRAIN) ×3 IMPLANT
GAUZE SPONGE 4X4 12PLY STRL (GAUZE/BANDAGES/DRESSINGS) IMPLANT
GAUZE SPONGE 4X4 16PLY XRAY LF (GAUZE/BANDAGES/DRESSINGS) IMPLANT
GLOVE BIOGEL PI IND STRL 7.0 (GLOVE) ×2 IMPLANT
GLOVE BIOGEL PI IND STRL 7.5 (GLOVE) ×1 IMPLANT
GLOVE BIOGEL PI INDICATOR 7.0 (GLOVE) ×4
GLOVE BIOGEL PI INDICATOR 7.5 (GLOVE) ×2
GLOVE EXAM NITRILE LRG STRL (GLOVE) IMPLANT
GLOVE SS BIOGEL STRL SZ 7 (GLOVE) ×2 IMPLANT
GLOVE SUPERSENSE BIOGEL SZ 7 (GLOVE) ×4
GLOVE SURG SS PI 6.5 STRL IVOR (GLOVE) ×6 IMPLANT
GOWN STRL REUS W/ TWL LRG LVL3 (GOWN DISPOSABLE) ×2 IMPLANT
GOWN STRL REUS W/ TWL XL LVL3 (GOWN DISPOSABLE) IMPLANT
GOWN STRL REUS W/TWL LRG LVL3 (GOWN DISPOSABLE) ×4
GOWN STRL REUS W/TWL XL LVL3 (GOWN DISPOSABLE)
HEMOSTAT POWDER KIT SURGIFOAM (HEMOSTASIS) ×6 IMPLANT
KIT BASIN OR (CUSTOM PROCEDURE TRAY) ×3 IMPLANT
KIT ROOM TURNOVER OR (KITS) ×3 IMPLANT
NEEDLE HYPO 25X1 1.5 SAFETY (NEEDLE) ×3 IMPLANT
NEEDLE SPNL 22GX3.5 QUINCKE BK (NEEDLE) ×3 IMPLANT
NS IRRIG 1000ML POUR BTL (IV SOLUTION) ×3 IMPLANT
PACK LAMINECTOMY NEURO (CUSTOM PROCEDURE TRAY) ×3 IMPLANT
PAD ARMBOARD 7.5X6 YLW CONV (MISCELLANEOUS) ×9 IMPLANT
PATTIES SURGICAL .5X1.5 (GAUZE/BANDAGES/DRESSINGS) ×3 IMPLANT
PLATE ARCHON 42MM 2LVL (Plate) ×3 IMPLANT
RUBBERBAND STERILE (MISCELLANEOUS) ×6 IMPLANT
SCREW ARCHON SELFTAP 4.0X15MM (Screw) ×6 IMPLANT
SCREW ARCHON ST VAR 4.0X15MM (Screw) ×8 IMPLANT
SCREW BN 15X4XST VA NS SPN (Screw) ×4 IMPLANT
SPONGE INTESTINAL PEANUT (DISPOSABLE) ×3 IMPLANT
SPONGE SURGIFOAM ABS GEL SZ50 (HEMOSTASIS) ×3 IMPLANT
STAPLER VISISTAT 35W (STAPLE) ×3 IMPLANT
STOCKINETTE 6  STRL (DRAPES) ×2
STOCKINETTE 6 STRL (DRAPES) ×1 IMPLANT
SUT MNCRL AB 3-0 PS2 18 (SUTURE) IMPLANT
SUT STRATAFIX MNCRL+ 3-0 PS-2 (SUTURE) ×1
SUT STRATAFIX MONOCRYL 3-0 (SUTURE) ×2
SUT VIC AB 3-0 SH 8-18 (SUTURE) ×3 IMPLANT
SUTURE STRATFX MNCRL+ 3-0 PS-2 (SUTURE) ×1 IMPLANT
TOWEL OR 17X24 6PK STRL BLUE (TOWEL DISPOSABLE) ×6 IMPLANT
TOWEL OR 17X26 10 PK STRL BLUE (TOWEL DISPOSABLE) ×3 IMPLANT
TUBE CONNECTING 12'X1/4 (SUCTIONS)
TUBE CONNECTING 12X1/4 (SUCTIONS) IMPLANT
WATER STERILE IRR 1000ML POUR (IV SOLUTION) ×3 IMPLANT

## 2015-08-09 NOTE — H&P (Signed)
CC:  No chief complaint on file.   HPI: Shannon Price is a 55 y.o. female with cervical myelopathy presents for elective C4-5 and C5-6 ACDF.  No changes since clinic.  PMH: Past Medical History:  Diagnosis Date  . Anxiety   . CVA (cerebral vascular accident) ([place]) 07/2013   "just affected my speech"  . [id number])    "weekly" (07/21/2013)  . High cholesterol   . Hypertension   . Type II diabetes mellitus ([facility])     PSH: Past Surgical History:  Procedure Laterality Date  . CESAREAN SECTION  1982    SH: Social History  Substance Use Topics  . Smoking status: Current Every Day Smoker    Packs/day: 2.00    Years: 39.00    Types: Cigarettes  . Smokeless tobacco: Never Used  . Alcohol use Yes     Comment: occ    MEDS: Prior to Admission medications   Medication Sig Start Date End Date Taking? Authorizing Provider  gabapentin (NEURONTIN) 300 MG capsule Take 1 capsule (300 mg total) by mouth 3 (three) times daily. 08/02/15  Yes Mikey Kirschner, MD  glipiZIDE (GLUCOTROL) 5 MG tablet Take 1 tablet (5 mg total) by mouth 2 (two) times daily before a meal. 08/02/15  Yes Mikey Kirschner, MD  lisinopril-hydrochlorothiazide (ZESTORETIC) 20-12.5 MG tablet Take 1 tablet by mouth daily. 08/02/15  Yes Mikey Kirschner, MD  metFORMIN (GLUCOPHAGE) 1000 MG tablet Take 1 tablet (1,000 mg total) by mouth 2 (two) times daily. 08/02/15  Yes Mikey Kirschner, MD  verapamil (CALAN-SR) 240 MG CR tablet Take 1 tablet (240 mg total) by mouth every morning. 08/02/15  Yes Mikey Kirschner, MD  aspirin 325 MG tablet Take 1 tablet (325 mg total) by mouth daily. Patient not taking: Reported on 07/19/2015 10/13/14   Rosalin Hawking, MD  Aspirin-Salicylamide-Caffeine [facility] HEADACHE POWDER PO) Take 1 packet by mouth daily as needed.    Historical Provider, MD  atorvastatin (LIPITOR) 20 MG tablet Take 1 tablet (20 mg total) by mouth daily. 08/02/15   Mikey Kirschner, MD  chlorzoxazone (PARAFON) 500 MG tablet Take 1  tablet (500 mg total) by mouth 3 (three) times daily as needed for muscle spasms. Patient not taking: Reported on 10/13/2014 11/03/13   Mikey Kirschner, MD  citalopram (CELEXA) 10 MG tablet Take 1 tablet (10 mg total) by mouth daily. Patient not taking: Reported on 07/19/2015 10/13/14   Rosalin Hawking, MD  folic acid (FOLVITE) 1 MG tablet Take 1 tablet (1 mg total) by mouth daily. Patient not taking: Reported on 10/13/2014 08/05/13   Mikey Kirschner, MD  rOPINIRole (REQUIP) 0.5 MG tablet Take 1 tablet (0.5 mg total) by mouth at bedtime. 11/11/13   Rosalin Hawking, MD    ALLERGY: Not on File  ROS: ROS  NEUROLOGIC EXAM: Awake, alert, oriented Memory and concentration grossly intact Speech fluent, appropriate CN grossly intact Motor exam: Upper Extremities Deltoid Bicep Tricep Grip  Right 4/5 4/5 4/5 4/5  Left 4/5 4/5 4/5 4/5   Lower Extremity IP Quad PF DF EHL  Right 5/5 5/5 5/5 5/5 5/5  Left 5/5 5/5 5/5 5/5 5/5   Sensation grossly intact to LT  IMAGING: No new imaging available  IMPRESSION: - 55 y.o. female with cervical myelopathy, cervical stenosis C4-5 and C5-6.    PLAN: - C4-5 and C5-6 ACDF - Patient understands risks and benefits and wishes to proceed.

## 2015-08-09 NOTE — Progress Notes (Signed)
Pt doing well. Pt and husband given D/C instructions with Rx, verbal understanding was provided. Pt's incision is open to air and is clean and dry. Pt's IV's and Hemovac were removed prior to D/C. Pt D/C'd home via walking @ 1805 per MD order. Pt is stable @ D/C and has no other needs at this time. Holli Humbles, RN

## 2015-08-09 NOTE — Op Note (Signed)
08/09/2015  10:36 AM  PATIENT:  Shannon Price  55 y.o. 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  I recommended the above operation.  Patient understood the risks, benefits, and alternatives and potential outcomes and wished to proceed.  PROCEDURE DETAILS: Patient was brought to the operating room placed under general endotracheal anesthesia. Patient was placed in the supine position on the operating room table. The neck was prepped with betadine and chloraprep and draped in a sterile fashion.   A transverse incision was made on the right side of the neck. Dissection was carried down thru the subcutaneous tissue and the platysma was elevated, opened vertically, and undermined with Metzenbaum scissors. Dissection was then carried out thru an avascular plane leaving the sternocleidomastoid, carotid artery, and jugular vein laterally and the trachea and esophagus medially. The ventral aspect of the vertebral column was identified and a localizing x-ray was taken. The C4-5 level was identified. The longus colli muscles were then elevated and the retractor was placed. The annulus was incised and the disc space entered. Discectomy was performed with micro-curettes and pituitary and kerrison rongeurs. I then used the high-speed drill to drill the endplates down to the level of the posterior longitudinal ligament. The operating microscope was draped and brought into the field provided additional magnification, illumination and visualization. Utilizing microsurgical technique,  discectomy was continued posteriorly thru the disc space. Posterior longitudinal ligament was opened with a nerve hook, and then removed along with disc herniation and osteophytes, decompressing the spinal canal and thecal sac. We then continued to remove osteophytic overgrowth and disc material decompressing the neural foramina and exiting nerve roots bilaterally. So by both visualization and palpation we felt we had an adequate decompression of the neural elements. We then measured the height of the intravertebral disc space and selected a Peek interbody cage packed with morselized allograft. It was then gently positioned in the intravertebral disc space and countersunk.   The superior distraction pin was then removed and bone bleeding was stopped with bone wax. The distraction pin was inserted at the inferior level. The annulus was incised and the disc space entered. Discectomy was performed with micro-curettes and pituitary and kerrison rongeurs. I then used the high-speed drill to drill the endplates down to the level of the posterior longitudinal ligament. The operating microscope was returned to the field.  The discectomy was continued posteriorly thru the disc space. Posterior longitudinal ligament was opened with a nerve hook, and then removed along with disc herniation and osteophytes, decompressing the spinal canal and thecal sac. We then continued to remove osteophytic overgrowth and disc material decompressing the neural foramina and exiting nerve roots bilaterally. So by both visualization and palpation we felt we had an adequate decompression of the neural elements. We then measured the height of the intravertebral disc space and selected a second Peek interbody cage which was packed with morselized allograft. It was then gently positioned in the intravertebral disc space and countersunk.   I then inserted a titanium plate and placed four variable angle screws into the two superior vertebral  bodies  and two fixed angle screws at the inferior level and locked them into position. The wound was irrigated with bacitracin solution, checked for hemostasis which was established and confirmed. Once meticulous hemostasis was achieved a medium hemovac drain was placed. The platysma was closed with interrupted 3-0 undyed Vicryl suture, the subcuticular layer was closed with running undyed Vicryl suture. The skin edges were approximated with dermabond. The drapes were removed. A sterile dressing was applied. The patient was then awakened from general anesthesia and transferred to the recovery room in stable condition. At the end of the procedure all sponge, needle and instrument counts were correct.   PATIENT DISPOSITION:  PACU - hemodynamically stable.   Delay start of Pharmacological VTE agent (>24hrs) due to surgical blood loss or risk of bleeding:  yes

## 2015-08-09 NOTE — Progress Notes (Signed)
No acute events Moving arms and legs well Stable Hopefully home tomorrow

## 2015-08-09 NOTE — Discharge Summary (Signed)
Date of Admission: 08/09/2015  Date of Discharge: 08/09/15  Admission Diagnosis: Cervical spondylotic myelopathy  Discharge Diagnosis: Same  Procedure Performed: C4-5 and C5-6 ACDF  Attending: Kevan Ny Quinetta Shilling, MD  Hospital Course:  The patient was admitted for the above listed operation and had an uncomplicated post-operative course.  They were discharged in stable condition.  Follow up: 3 weeks  Meds: Resume home meds, norco for pain, robaxin for muscle spasms

## 2015-08-09 NOTE — Anesthesia Procedure Notes (Signed)
Procedure Name: Intubation Date/Time: 08/09/2015 7:53 AM Performed by: Garrison Columbus T Pre-anesthesia Checklist: Patient identified, Emergency Drugs available, Suction available and Patient being monitored Patient Re-evaluated:Patient Re-evaluated prior to inductionOxygen Delivery Method: Circle System Utilized Preoxygenation: Pre-oxygenation with 100% oxygen Intubation Type: IV induction Ventilation: Mask ventilation without difficulty and Oral airway inserted - appropriate to patient size Laryngoscope Size: Sabra Heck and 2 Grade View: Grade I Tube type: Oral Tube size: 7.0 mm Number of attempts: 1 Airway Equipment and Method: Stylet and Oral airway Placement Confirmation: ETT inserted through vocal cords under direct vision,  positive ETCO2 and breath sounds checked- equal and bilateral Secured at: 21 cm Tube secured with: Tape Dental Injury: Teeth and Oropharynx as per pre-operative assessment

## 2015-08-09 NOTE — Transfer of Care (Signed)
Immediate Anesthesia Transfer of Care Note  Patient: Shannon Price  Procedure(s) Performed: Procedure(s) with comments: Cervical Four- Five, Cervical Five-Six  Anterior cervical decompression/diskectomy/fusion/plate fixation (N/A) - right side approach  Patient Location: PACU  Anesthesia Type:General  Level of Consciousness: awake, alert  and oriented  Airway & Oxygen Therapy: Patient Spontanous Breathing  Post-op Assessment: Report given to RN, Post -op Vital signs reviewed and stable and Patient moving all extremities X 4  Post vital signs: Reviewed and stable  Last Vitals:  Vitals:   08/09/15 0633  BP: 131/66  Pulse: (!) 106  Resp: 16  Temp: 36.9 C    Last Pain:  Vitals:   08/09/15 0633  TempSrc: Oral         Complications: No apparent anesthesia complications

## 2015-08-09 NOTE — Anesthesia Postprocedure Evaluation (Signed)
Anesthesia Post Note  Patient: Shannon Price  Procedure(s) Performed: Procedure(s) (LRB): Cervical Four- Five, Cervical Five-Six  Anterior cervical decompression/diskectomy/fusion/plate fixation (N/A)  Patient location during evaluation: PACU Anesthesia Type: General Level of consciousness: awake and alert Pain management: pain level controlled Vital Signs Assessment: post-procedure vital signs reviewed and stable Respiratory status: spontaneous breathing, nonlabored ventilation, respiratory function stable and patient connected to nasal cannula oxygen Cardiovascular status: blood pressure returned to baseline and stable Postop Assessment: no signs of nausea or vomiting Anesthetic complications: no    Last Vitals:  Vitals:   08/09/15 1100 08/09/15 1115  BP:    Pulse: (!) 105 (!) 102  Resp: (!) 46 16  Temp:      Last Pain:  Vitals:   08/09/15 1052  TempSrc:   PainSc: 0-No pain                 Yosmar Ryker L

## 2015-08-09 NOTE — Progress Notes (Signed)
Occupational Therapy Evaluation/Discharge Patient Details Name: Shannon Price MRN: [id number] DOB: 12-14-1960 Today's Date: 08/09/2015    History of Present Illness 55 y.o. female s/p C4-5 and C5-6 ACDF. PMH significant for HTN, DM type II, CVA (2015), anxiety, and HA.    Clinical Impression   PTA, pt was independent with all ADLs and mobility. Pt currently requires supervision for all OOB mobility and ADLs for safety due to increased dizziness reported. Educated pt on cervical precautions, compensatory strategies for ADLs, and fall prevention strategies. All education has been completed and pt has no further questions. Pt with no further acute OT needs. OT signing off. Thank you for this referral.    Follow Up Recommendations  No OT follow up;Supervision/Assistance - 24 hour    Equipment Recommendations  None recommended by OT    Recommendations for Other Services       Precautions / Restrictions Precautions Precautions: Cervical Precaution Booklet Issued: Yes (comment) Precaution Comments: Educated pt on cervical precautions and proper body mechanics Restrictions Weight Bearing Restrictions: No      Mobility Bed Mobility Overal bed mobility: Needs Assistance Bed Mobility: Rolling;Sidelying to Sit;Sit to Supine Rolling: Supervision Sidelying to sit: Supervision   Sit to supine: Min guard   General bed mobility comments: Supervision for safety and VCs for log roll technique.  Transfers Overall transfer level: Needs assistance Equipment used: None Transfers: Sit to/from Stand Sit to Stand: Supervision         General transfer comment: Supervision for safety due to dizziness reported upon standing which resolved after 1 minute    Balance Overall balance assessment: Needs assistance Sitting-balance support: No upper extremity supported;Feet supported Sitting balance-Leahy Scale: Good     Standing balance support: No upper extremity supported;During functional  activity Standing balance-Leahy Scale: Good                              ADL Overall ADL's : Modified independent                                       General ADL Comments: Educated on cervical precautions, compensatory strategies for ADLs, and fall prevention strategies     Vision Vision Assessment?: No apparent visual deficits   Perception     Praxis      Pertinent Vitals/Pain Pain Assessment: Faces Faces Pain Scale: Hurts little more Pain Location: neck/incision site Pain Descriptors / Indicators: Sore Pain Intervention(s): Limited activity within patient's tolerance;Monitored during session;Repositioned     Hand Dominance Right   Extremity/Trunk Assessment Upper Extremity Assessment Upper Extremity Assessment: Overall WFL for tasks assessed (limitations due to cervical precautions)   Lower Extremity Assessment Lower Extremity Assessment: Overall WFL for tasks assessed   Cervical / Trunk Assessment Cervical / Trunk Assessment: Normal   Communication Communication Communication: No difficulties   Cognition Arousal/Alertness: Awake/alert Behavior During Therapy: WFL for tasks assessed/performed Overall Cognitive Status: Within Functional Limits for tasks assessed                     General Comments       Exercises       Shoulder Instructions      Home Living Family/patient expects to be discharged to:: Private residence Living Arrangements: Spouse/significant other Available Help at Discharge: Family;Available 24 hours/day Type of Home: House Home Access: Stairs to enter  Entrance Stairs-Number of Steps: 2 Entrance Stairs-Rails: None Home Layout: One level     Bathroom Shower/Tub: Occupational psychologist: Standard     Home Equipment: Cane - single point;Adaptive equipment;Hand held shower head Adaptive Equipment: Reacher        Prior Functioning/Environment Level of Independence: Independent              OT Diagnosis: Acute pain   OT Problem List: Decreased strength;Decreased range of motion;Impaired balance (sitting and/or standing);Decreased safety awareness;Decreased knowledge of use of DME or AE;Obesity;Pain   OT Treatment/Interventions:      OT Goals(Current goals can be found in the care plan section) Acute Rehab OT Goals Patient Stated Goal: to go home OT Goal Formulation: With patient Time For Goal Achievement: 08/23/15 Potential to Achieve Goals: Good  OT Frequency:     Barriers to D/C:            Co-evaluation              End of Session Nurse Communication: Mobility status  Activity Tolerance: Patient tolerated treatment well Patient left: in bed;with call bell/phone within reach   Time: [id number] OT Time Calculation (min): 11 min Charges:  OT General Charges $OT Visit: 1 Procedure OT Evaluation $OT Eval Low Complexity: 1 Procedure G-Codes: OT G-codes **NOT FOR INPATIENT CLASS** Functional Assessment Tool Used: clinical judgement Functional Limitation: Self care Self Care Current Status [id number]): At least 1 percent but less than 20 percent impaired, limited or restricted Self Care Goal Status [id number]): At least 1 percent but less than 20 percent impaired, limited or restricted Self Care Discharge Status [phone]): At least 1 percent but less than 20 percent impaired, limited or restricted  Redmond Baseman, OTR/L Pager: [phone] 08/09/2015, 4:37 PM

## 2015-08-10 ENCOUNTER — Encounter (HOSPITAL_COMMUNITY): Payer: Self-pay | Admitting: Neurological Surgery

## 2015-08-13 ENCOUNTER — Other Ambulatory Visit: Payer: Self-pay | Admitting: Family Medicine

## 2015-08-15 ENCOUNTER — Other Ambulatory Visit: Payer: Self-pay | Admitting: *Deleted

## 2015-08-15 MED ORDER — BLOOD GLUCOSE MONITOR KIT: PACK | 5 refills | Status: DC

## 2015-09-04 LAB — HEPATIC FUNCTION PANEL
ALBUMIN: 4.2 g/dL (ref 3.5–5.5)
ALK PHOS: 119 IU/L — AB (ref 39–117)
ALT: 21 IU/L (ref 0–32)
AST: 13 IU/L (ref 0–40)
BILIRUBIN TOTAL: 0.2 mg/dL (ref 0.0–1.2)
BILIRUBIN, DIRECT: 0.06 mg/dL (ref 0.00–0.40)
TOTAL PROTEIN: 6.8 g/dL (ref 6.0–8.5)

## 2015-09-04 LAB — BASIC METABOLIC PANEL
BUN / CREAT RATIO: 9 (ref 9–23)
BUN: 6 mg/dL (ref 6–24)
CO2: 21 mmol/L (ref 18–29)
CREATININE: 0.65 mg/dL (ref 0.57–1.00)
Calcium: 9.6 mg/dL (ref 8.7–10.2)
Chloride: 98 mmol/L (ref 96–106)
GFR calc Af Amer: 117 mL/min/{1.73_m2} (ref >59)
GFR, EST NON AFRICAN AMERICAN: 102 mL/min/{1.73_m2} (ref >59)
Glucose: 160 mg/dL — ABNORMAL HIGH (ref 65–99)
Potassium: 5.3 mmol/L — ABNORMAL HIGH (ref 3.5–5.2)
SODIUM: 140 mmol/L (ref 134–144)

## 2015-09-04 LAB — LIPID PANEL
CHOL/HDL RATIO: 5 ratio — AB (ref 0.0–4.4)
Cholesterol, Total: 199 mg/dL (ref 100–199)
HDL: 40 mg/dL (ref >39)
LDL Calculated: 92 mg/dL (ref 0–99)
Triglycerides: 336 mg/dL — ABNORMAL HIGH (ref 0–149)
VLDL Cholesterol Cal: 67 mg/dL — ABNORMAL HIGH (ref 5–40)

## 2015-09-09 ENCOUNTER — Ambulatory Visit (INDEPENDENT_AMBULATORY_CARE_PROVIDER_SITE_OTHER): Payer: BLUE CROSS/BLUE SHIELD | Admitting: Family Medicine

## 2015-09-09 ENCOUNTER — Encounter: Payer: Self-pay | Admitting: Family Medicine

## 2015-09-09 VITALS — BP 138/76 | Ht 70.0 in | Wt 252.0 lb

## 2015-09-09 DIAGNOSIS — I10 Essential (primary) hypertension: Secondary | ICD-10-CM | POA: Diagnosis not present

## 2015-09-09 DIAGNOSIS — G4700 Insomnia, unspecified: Secondary | ICD-10-CM

## 2015-09-09 DIAGNOSIS — E785 Hyperlipidemia, unspecified: Secondary | ICD-10-CM | POA: Diagnosis not present

## 2015-09-09 DIAGNOSIS — I63312 Cerebral infarction due to thrombosis of left middle cerebral artery: Secondary | ICD-10-CM

## 2015-09-09 DIAGNOSIS — E119 Type 2 diabetes mellitus without complications: Secondary | ICD-10-CM

## 2015-09-09 DIAGNOSIS — Z23 Encounter for immunization: Secondary | ICD-10-CM | POA: Diagnosis not present

## 2015-09-09 NOTE — Progress Notes (Signed)
Subjective:    Patient ID: Shannon Price, female    DOB: [birth year], 55 y.o.   MRN: [id number] Patient arrives office with numerous concerns HPIpt arrives today to follow up on bloodwork results.   A1C done in July.   Recent Results (from the past 2160 hour(s))  POC CBG, ED     Status: Abnormal   Collection Time: 07/19/15 12:12 PM  Result Value Ref Range   Glucose-Capillary 273 (H) 65 - 99 mg/dL   Comment 1 Document in Chart   CBC with Differential/Platelet     Status: Abnormal   Collection Time: 07/19/15  1:15 PM  Result Value Ref Range   WBC 9.7 4.0 - 10.5 K/uL   RBC 5.05 3.87 - 5.11 MIL/uL   Hemoglobin 15.4 (H) 12.0 - 15.0 g/dL   HCT 46.4 (H) 36.0 - 46.0 %   MCV 91.9 78.0 - 100.0 fL   MCH 30.5 26.0 - 34.0 pg   MCHC 33.2 30.0 - 36.0 g/dL   RDW 13.4 11.5 - 15.5 %   Platelets 241 150 - 400 K/uL   Neutrophils Relative % 66 %   Neutro Abs 6.4 1.7 - 7.7 K/uL   Lymphocytes Relative 26 %   Lymphs Abs 2.5 0.7 - 4.0 K/uL   Monocytes Relative 6 %   Monocytes Absolute 0.6 0.1 - 1.0 K/uL   Eosinophils Relative 2 %   Eosinophils Absolute 0.2 0.0 - 0.7 K/uL   Basophils Relative 0 %   Basophils Absolute 0.0 0.0 - 0.1 K/uL  Comprehensive metabolic panel     Status: Abnormal   Collection Time: 07/19/15  1:15 PM  Result Value Ref Range   Sodium 135 135 - 145 mmol/L   Potassium 3.8 3.5 - 5.1 mmol/L   Chloride 102 101 - 111 mmol/L   CO2 27 22 - 32 mmol/L   Glucose, Bld 241 (H) 65 - 99 mg/dL   BUN 10 6 - 20 mg/dL   Creatinine, Ser 0.58 0.44 - 1.00 mg/dL   Calcium 9.4 8.9 - 10.3 mg/dL   Total Protein 7.3 6.5 - 8.1 g/dL   Albumin 3.9 3.5 - 5.0 g/dL   AST 17 15 - 41 U/L   ALT 24 14 - 54 U/L   Alkaline Phosphatase 94 38 - 126 U/L   Total Bilirubin 0.5 0.3 - 1.2 mg/dL   GFR calc non Af Amer >60 >60 mL/min   GFR calc Af Amer >60 >60 mL/min    Comment: (NOTE) The eGFR has been calculated using the CKD EPI equation. This calculation has not been validated in all clinical  situations. eGFR's persistently <60 mL/min signify possible Chronic Kidney Disease.    Anion gap 6 5 - 15  POCT glycosylated hemoglobin (Hb A1C)     Status: None   Collection Time: 08/02/15  2:36 PM  Result Value Ref Range   Hemoglobin A1C 7.2   Glucose, capillary     Status: Abnormal   Collection Time: 08/05/15  3:22 PM  Result Value Ref Range   Glucose-Capillary 122 (H) 65 - 99 mg/dL  Type and screen     Status: None   Collection Time: 08/05/15  4:26 PM  Result Value Ref Range   ABO/RH(D) A POS    Antibody Screen NEG    Sample Expiration 08/19/2015    Extend sample reason NO TRANSFUSIONS OR PREGNANCY IN THE PAST 3 MONTHS   ABO/Rh     Status: None   Collection Time: 08/05/15  4:26 PM  Result Value Ref Range   ABO/RH(D) A POS   CBC     Status: None   Collection Time: 08/05/15  4:28 PM  Result Value Ref Range   WBC 10.2 4.0 - 10.5 K/uL   RBC 4.76 3.87 - 5.11 MIL/uL   Hemoglobin 14.5 12.0 - 15.0 g/dL   HCT 43.8 36.0 - 46.0 %   MCV 92.0 78.0 - 100.0 fL   MCH 30.5 26.0 - 34.0 pg   MCHC 33.1 30.0 - 36.0 g/dL   RDW 13.3 11.5 - 15.5 %   Platelets 276 150 - 400 K/uL  Basic metabolic panel     Status: Abnormal   Collection Time: 08/05/15  4:28 PM  Result Value Ref Range   Sodium 133 (L) 135 - 145 mmol/L   Potassium 3.8 3.5 - 5.1 mmol/L   Chloride 100 (L) 101 - 111 mmol/L   CO2 23 22 - 32 mmol/L   Glucose, Bld 106 (H) 65 - 99 mg/dL   BUN 7 6 - 20 mg/dL   Creatinine, Ser 0.63 0.44 - 1.00 mg/dL   Calcium 9.6 8.9 - 10.3 mg/dL   GFR calc non Af Amer >60 >60 mL/min   GFR calc Af Amer >60 >60 mL/min    Comment: (NOTE) The eGFR has been calculated using the CKD EPI equation. This calculation has not been validated in all clinical situations. eGFR's persistently <60 mL/min signify possible Chronic Kidney Disease.    Anion gap 10 5 - 15  Surgical PCR screen     Status: Abnormal   Collection Time: 08/05/15  4:28 PM  Result Value Ref Range   MRSA, PCR NEGATIVE NEGATIVE    Staphylococcus aureus POSITIVE (A) NEGATIVE    Comment:        The Xpert SA Assay (FDA approved for NASAL specimens in patients over 86 years of age), is one component of a comprehensive surveillance program.  Test performance has been validated by [facility] for patients greater than or equal to 61 year old. It is not intended to diagnose infection nor to guide or monitor treatment.   Glucose, capillary     Status: Abnormal   Collection Time: 08/09/15  6:30 AM  Result Value Ref Range   Glucose-Capillary 174 (H) 65 - 99 mg/dL   Comment 1 Notify RN    Comment 2 Document in Chart   Glucose, capillary     Status: Abnormal   Collection Time: 08/09/15 10:57 AM  Result Value Ref Range   Glucose-Capillary 194 (H) 65 - 99 mg/dL  Glucose, capillary     Status: Abnormal   Collection Time: 08/09/15  1:31 PM  Result Value Ref Range   Glucose-Capillary 240 (H) 65 - 99 mg/dL  Glucose, capillary     Status: Abnormal   Collection Time: 08/09/15  4:40 PM  Result Value Ref Range   Glucose-Capillary 228 (H) 65 - 99 mg/dL  Lipid panel     Status: Abnormal   Collection Time: 09/03/15  9:18 AM  Result Value Ref Range   Cholesterol, Total 199 100 - 199 mg/dL   Triglycerides 336 (H) 0 - 149 mg/dL   HDL 40 >39 mg/dL   VLDL Cholesterol Cal 67 (H) 5 - 40 mg/dL   LDL Calculated 92 0 - 99 mg/dL   Chol/HDL Ratio 5.0 (H) 0.0 - 4.4 ratio units    Comment:  T. Chol/HDL Ratio                                             Men  Women                               1/2 Avg.Risk  3.4    3.3                                   Avg.Risk  5.0    4.4                                2X Avg.Risk  9.6    7.1                                3X Avg.Risk 23.4   11.0   Hepatic function panel     Status: Abnormal   Collection Time: 09/03/15  9:18 AM  Result Value Ref Range   Total Protein 6.8 6.0 - 8.5 g/dL   Albumin 4.2 3.5 - 5.5 g/dL   Bilirubin Total 0.2 0.0 - 1.2 mg/dL   Bilirubin,  Direct 0.06 0.00 - 0.40 mg/dL   Alkaline Phosphatase 119 (H) 39 - 117 IU/L   AST 13 0 - 40 IU/L   ALT 21 0 - 32 IU/L  Basic metabolic panel     Status: Abnormal   Collection Time: 09/03/15  9:18 AM  Result Value Ref Range   Glucose 160 (H) 65 - 99 mg/dL   BUN 6 6 - 24 mg/dL   Creatinine, Ser 0.65 0.57 - 1.00 mg/dL   GFR calc non Af Amer 102 >59 mL/min/1.73   GFR calc Af Amer 117 >59 mL/min/1.73   BUN/Creatinine Ratio 9 9 - 23   Sodium 140 134 - 144 mmol/L   Potassium 5.3 (H) 3.5 - 5.2 mmol/L   Chloride 98 96 - 106 mmol/L   CO2 21 18 - 29 mmol/L   Calcium 9.6 8.7 - 10.2 mg/dL    Pt states no concerns today.   Results for orders placed or performed during the hospital encounter of 08/09/15  Glucose, capillary  Result Value Ref Range   Glucose-Capillary 174 (H) 65 - 99 mg/dL   Comment 1 Notify RN    Comment 2 Document in Chart   Glucose, capillary  Result Value Ref Range   Glucose-Capillary 194 (H) 65 - 99 mg/dL  Glucose, capillary  Result Value Ref Range   Glucose-Capillary 240 (H) 65 - 99 mg/dL  Glucose, capillary  Result Value Ref Range   Glucose-Capillary 228 (H) 65 - 99 mg/dL   Patient claims compliance with diabetes medication. No obvious side effects. Reports no substantial low sugar spells. Most numbers are generally in good range when checked fasting. Generally does not miss a dose of medication. Watching diabetic diet closely  Blood pressure medicine and blood pressure levels reviewed today with patient. Compliant with blood pressure medicine. States does not miss a dose. No obvious side effects. Blood pressure generally good when checked elsewhere. Watching salt intake.  History of stroke. No recent neurological symptoms.  Unfortunately  still smoking. Resistant to take medicine for smoking cessation  Review of Systems    No headache, no major weight loss or weight gain, no chest pain no back pain abdominal pain no change in bowel habits complete ROS otherwise  negative  Objective:   Physical Exam   Obesity present lungs clear. Heart regular rate and rhythm. HEENT normal lungs no wheezes or crackles ankles trace edema feet sensation intact     Assessment & Plan:  Impression 1 hypertension good control discussed maintain same #2 hyperlipidemia ongoing prior blood work reviewed to maintain same level #3 chronic smoking encouraged to quit #4 history of stroke no neuro symptoms plan diet exercise discussed. Medications refilled. Encouraged to stop smoking. Recheck in several months WSL

## 2015-12-09 ENCOUNTER — Ambulatory Visit (INDEPENDENT_AMBULATORY_CARE_PROVIDER_SITE_OTHER): Payer: BLUE CROSS/BLUE SHIELD | Admitting: Family Medicine

## 2015-12-09 ENCOUNTER — Encounter: Payer: Self-pay | Admitting: Family Medicine

## 2015-12-09 VITALS — BP 130/80 | Ht 70.0 in | Wt 253.0 lb

## 2015-12-09 DIAGNOSIS — I10 Essential (primary) hypertension: Secondary | ICD-10-CM | POA: Diagnosis not present

## 2015-12-09 DIAGNOSIS — I63312 Cerebral infarction due to thrombosis of left middle cerebral artery: Secondary | ICD-10-CM

## 2015-12-09 DIAGNOSIS — I639 Cerebral infarction, unspecified: Secondary | ICD-10-CM

## 2015-12-09 DIAGNOSIS — E119 Type 2 diabetes mellitus without complications: Secondary | ICD-10-CM

## 2015-12-09 DIAGNOSIS — I6381 Other cerebral infarction due to occlusion or stenosis of small artery: Secondary | ICD-10-CM

## 2015-12-09 DIAGNOSIS — E785 Hyperlipidemia, unspecified: Secondary | ICD-10-CM | POA: Diagnosis not present

## 2015-12-09 DIAGNOSIS — Z79899 Other long term (current) drug therapy: Secondary | ICD-10-CM

## 2015-12-09 LAB — POCT GLYCOSYLATED HEMOGLOBIN (HGB A1C): Hemoglobin A1C: 6.1

## 2015-12-09 MED ORDER — GLIPIZIDE 5 MG PO TABS: 5.0000 mg | ORAL_TABLET | Freq: Two times a day (BID) | ORAL | 5 refills | Status: DC

## 2015-12-09 MED ORDER — LISINOPRIL-HYDROCHLOROTHIAZIDE 20-12.5 MG PO TABS: 1.0000 | ORAL_TABLET | Freq: Every day | ORAL | 5 refills | Status: DC

## 2015-12-09 MED ORDER — METFORMIN HCL 1000 MG PO TABS: 1000.0000 mg | ORAL_TABLET | Freq: Two times a day (BID) | ORAL | 5 refills | Status: DC

## 2015-12-09 MED ORDER — ATORVASTATIN CALCIUM 20 MG PO TABS: 20.0000 mg | ORAL_TABLET | Freq: Every day | ORAL | 5 refills | Status: DC

## 2015-12-09 MED ORDER — VERAPAMIL HCL ER 240 MG PO TBCR: 240.0000 mg | EXTENDED_RELEASE_TABLET | ORAL | 5 refills | Status: DC

## 2015-12-09 NOTE — Progress Notes (Signed)
   Subjective:    Patient ID: Shannon Price, female    DOB: [birth year], 55 y.o.   MRN: [id number] Patient arrives office with multiple concerns Diabetes  She presents for her follow-up diabetic visit. She has type 2 diabetes mellitus. There are no hypoglycemic associated symptoms. There are no diabetic associated symptoms. There are no hypoglycemic complications. There are no diabetic complications. There are no known risk factors for coronary artery disease. Current diabetic treatment includes oral agent (dual therapy). She is compliant with treatment all of the time.   Patient has not had a diabetic eye exam.   Results for orders placed or performed in visit on 12/09/15  POCT glycosylated hemoglobin (Hb A1C)  Result Value Ref Range   Hemoglobin A1C 6.1    Diet so so  No reg exercise  Patient has no new concerns at this time.   ough mostly non prod, tight at first , now oferall better  Blood pressure medicine and blood pressure levels reviewed today with patient. Compliant with blood pressure medicine. States does not miss a dose. No obvious side effects. Blood pressure generally good when checked elsewhere. Watching salt intake.   Patient continues to take lipid medication regularly. No obvious side effects from it. Generally does not miss a dose. Prior blood work results are reviewed with patient. Patient continues to work on fat intake in diet  No recent symptoms of stroke Review of Systems Alert vitals stable, NAD. Blood pressure good on repeat. HEENT normal. Lungs clear. Heart regular rate and rhythm.     Objective:   Physical Exam Alert vitals stable, NAD. Blood pressure good on repeat. HEENT normal. Lungs clear. Heart regular rate and rhythm. No focal neurological deficit ankles feet pulses good sensation intact       Assessment & Plan:  Impression 1 type 2 diabetes good control discussed encourage #2 hypertension good control discussed maintain same dose #3  hyperlipidemia status uncertain check blood work prior blood work reviewed maintain same for now #4 history of stroke clinically stable compliant with aspirin. No obvious side effects, diet exercise discussed arteries had flu shot appropriate blood work recheck in several months

## 2016-03-23 ENCOUNTER — Ambulatory Visit: Payer: BLUE CROSS/BLUE SHIELD | Admitting: Family Medicine

## 2016-04-22 LAB — HEMOGLOBIN A1C
Est. average glucose Bld gHb Est-mCnc: 146 mg/dL
Hgb A1c MFr Bld: 6.7 % — ABNORMAL HIGH (ref 4.8–5.6)

## 2016-04-22 LAB — HEPATIC FUNCTION PANEL
ALBUMIN: 4.4 g/dL (ref 3.5–5.5)
ALT: 23 IU/L (ref 0–32)
AST: 18 IU/L (ref 0–40)
Alkaline Phosphatase: 110 IU/L (ref 39–117)
BILIRUBIN TOTAL: 0.3 mg/dL (ref 0.0–1.2)
Bilirubin, Direct: 0.12 mg/dL (ref 0.00–0.40)
Total Protein: 7.1 g/dL (ref 6.0–8.5)

## 2016-04-22 LAB — LIPID PANEL
CHOLESTEROL TOTAL: 198 mg/dL (ref 100–199)
Chol/HDL Ratio: 4.8 ratio — ABNORMAL HIGH (ref 0.0–4.4)
HDL: 41 mg/dL (ref >39)
LDL CALC: 113 mg/dL — AB (ref 0–99)
Triglycerides: 220 mg/dL — ABNORMAL HIGH (ref 0–149)
VLDL Cholesterol Cal: 44 mg/dL — ABNORMAL HIGH (ref 5–40)

## 2016-04-27 ENCOUNTER — Encounter: Payer: Self-pay | Admitting: Family Medicine

## 2016-04-27 ENCOUNTER — Ambulatory Visit (INDEPENDENT_AMBULATORY_CARE_PROVIDER_SITE_OTHER): Payer: BLUE CROSS/BLUE SHIELD | Admitting: Family Medicine

## 2016-04-27 VITALS — BP 138/90 | Ht 70.0 in | Wt 251.0 lb

## 2016-04-27 DIAGNOSIS — I63312 Cerebral infarction due to thrombosis of left middle cerebral artery: Secondary | ICD-10-CM | POA: Diagnosis not present

## 2016-04-27 DIAGNOSIS — E119 Type 2 diabetes mellitus without complications: Secondary | ICD-10-CM | POA: Diagnosis not present

## 2016-04-27 DIAGNOSIS — F321 Major depressive disorder, single episode, moderate: Secondary | ICD-10-CM | POA: Diagnosis not present

## 2016-04-27 DIAGNOSIS — I10 Essential (primary) hypertension: Secondary | ICD-10-CM | POA: Diagnosis not present

## 2016-04-27 DIAGNOSIS — E785 Hyperlipidemia, unspecified: Secondary | ICD-10-CM

## 2016-04-27 MED ORDER — ESCITALOPRAM OXALATE 10 MG PO TABS: 10.0000 mg | ORAL_TABLET | Freq: Every day | ORAL | 5 refills | Status: DC

## 2016-04-27 MED ORDER — ATORVASTATIN CALCIUM 20 MG PO TABS: 20.0000 mg | ORAL_TABLET | Freq: Every day | ORAL | 5 refills | Status: DC

## 2016-04-27 MED ORDER — LISINOPRIL-HYDROCHLOROTHIAZIDE 20-12.5 MG PO TABS: 1.0000 | ORAL_TABLET | Freq: Every day | ORAL | 5 refills | Status: DC

## 2016-04-27 MED ORDER — VERAPAMIL HCL ER 240 MG PO TBCR: 240.0000 mg | EXTENDED_RELEASE_TABLET | ORAL | 5 refills | Status: DC

## 2016-04-27 MED ORDER — METFORMIN HCL 1000 MG PO TABS: 1000.0000 mg | ORAL_TABLET | Freq: Two times a day (BID) | ORAL | 5 refills | Status: DC

## 2016-04-27 MED ORDER — GLIPIZIDE 5 MG PO TABS: 5.0000 mg | ORAL_TABLET | Freq: Two times a day (BID) | ORAL | 5 refills | Status: DC

## 2016-04-27 NOTE — Progress Notes (Signed)
Subjective:    Patient ID: Shannon Price, female    DOB: 1960/10/02, 56 y.o.   MRN: [id number] Patient arrives office with a very high number of concerns. With very long discussion regarding all concerns Diabetes  She presents for her follow-up diabetic visit. She has type 2 diabetes mellitus. She is compliant with treatment all of the time. Eye exam is not current.  a1c on bloodwork 6.7. Patient claims compliance with diabetes medication. No obvious side effects. Reports no substantial low sugar spells. Most numbers are generally in good range when checked fasting. Generally does not miss a dose of medication. Watching diabetic diet closely  Results for orders placed or performed in visit on 12/09/15  Lipid panel  Result Value Ref Range   Cholesterol, Total 198 100 - 199 mg/dL   Triglycerides 220 (H) 0 - 149 mg/dL   HDL 41 >39 mg/dL   VLDL Cholesterol Cal 44 (H) 5 - 40 mg/dL   LDL Calculated 113 (H) 0 - 99 mg/dL   Chol/HDL Ratio 4.8 (H) 0.0 - 4.4 ratio  Hepatic function panel  Result Value Ref Range   Total Protein 7.1 6.0 - 8.5 g/dL   Albumin 4.4 3.5 - 5.5 g/dL   Bilirubin Total 0.3 0.0 - 1.2 mg/dL   Bilirubin, Direct 0.12 0.00 - 0.40 mg/dL   Alkaline Phosphatase 110 39 - 117 IU/L   AST 18 0 - 40 IU/L   ALT 23 0 - 32 IU/L  Hemoglobin A1c  Result Value Ref Range   Hgb A1c MFr Bld 6.7 (H) 4.8 - 5.6 %   Est. average glucose Bld gHb Est-mCnc 146 mg/dL  POCT glycosylated hemoglobin (Hb A1C)  Result Value Ref Range   Hemoglobin A1C 6.1     Concerns about depression and anger issues. More emotions and irritable, feeling down, progressive orver several months. Long-standing history of depression anxiety in the past. Worsened recently. Has worsened steadily since stroke. No suicidal thoughts. At times has "what's the use" thoughts.  Worse steadily since stroke  Blood pressure medicine and blood pressure levels reviewed today with patient. Compliant with blood pressure medicine.  States does not miss a dose. No obvious side effects. Blood pressure generally good when checked elsewhere. Watching salt intake. .   Patient continues to take lipid medication regularly. No obvious side effects from it. Generally does not miss a dose. Prior blood work results are reviewed with patient. Patient continues to work on fat intake in diet   Walking   Pain in left arm. Started a long time ago. Long standing hx of arm pain   Review of Systems No headache, no major weight loss or weight gain, no chest pain no back pain abdominal pain no change in bowel habits complete ROS otherwise negative     Objective:   Physical Exam  Alert and oriented, vitals reviewed and stable, NAD ENT-TM's and ext canals WNL bilat via otoscopic exam Soft palate, tonsils and post pharynx WNL via oropharyngeal exam Neck-symmetric, no masses; thyroid nonpalpable and nontender Pulmonary-no tachypnea or accessory muscle use; Clear without wheezes via auscultation Card--no abnrml murmurs, rhythm reg and rate WNL Carotid pulses symmetric, without bruits Positive impingement sign left shoulder      Assessment & Plan:  Impression 1 type 2 diabetes good control discussed maintain same #2 hypertension good control discussed maintain same #3 hyperlipidemia results reviewed not perfect #4 depression worsening likely aggravated by stress and history of stroke and history of prior depression anxiety  plan maintain usual medications. Add Lexapro 10 mg daily. #5 status post stroke. Neurologically stable at this time. Encouraged to maintain aspirin. Encouraged to get completely off smoking rationale discussed. Diet exercise discussed. Mental health referral rationale discussed maintain all other medications  Greater than 50% of this 40 minute face to face visit was spent in counseling and discussion and coordination of care regarding the above diagnosis/diagnosies

## 2016-04-28 DIAGNOSIS — F321 Major depressive disorder, single episode, moderate: Secondary | ICD-10-CM | POA: Insufficient documentation
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
## 2016-05-09 ENCOUNTER — Encounter (HOSPITAL_COMMUNITY): Payer: Self-pay | Admitting: Psychiatry

## 2016-05-09 ENCOUNTER — Ambulatory Visit (INDEPENDENT_AMBULATORY_CARE_PROVIDER_SITE_OTHER): Payer: BLUE CROSS/BLUE SHIELD | Admitting: Psychiatry

## 2016-05-09 VITALS — BP 136/90 | HR 84 | Ht 70.0 in | Wt 250.4 lb

## 2016-05-09 DIAGNOSIS — F331 Major depressive disorder, recurrent, moderate: Secondary | ICD-10-CM

## 2016-05-09 DIAGNOSIS — R45851 Suicidal ideations: Secondary | ICD-10-CM

## 2016-05-09 DIAGNOSIS — E785 Hyperlipidemia, unspecified: Secondary | ICD-10-CM

## 2016-05-09 DIAGNOSIS — F17210 Nicotine dependence, cigarettes, uncomplicated: Secondary | ICD-10-CM | POA: Diagnosis not present

## 2016-05-09 DIAGNOSIS — E119 Type 2 diabetes mellitus without complications: Secondary | ICD-10-CM | POA: Diagnosis not present

## 2016-05-09 DIAGNOSIS — F419 Anxiety disorder, unspecified: Secondary | ICD-10-CM | POA: Diagnosis not present

## 2016-05-09 DIAGNOSIS — F1021 Alcohol dependence, in remission: Secondary | ICD-10-CM

## 2016-05-09 DIAGNOSIS — I10 Essential (primary) hypertension: Secondary | ICD-10-CM

## 2016-05-09 NOTE — Patient Instructions (Signed)
1. Continue Lexapro 10 mg daily 2. Return to clinic in one month 3. Referral to Ms. Peggy Bynum
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
## 2016-06-05 ENCOUNTER — Ambulatory Visit (HOSPITAL_COMMUNITY): Payer: Self-pay | Admitting: Psychiatry

## 2016-07-04 DIAGNOSIS — Z029 Encounter for administrative examinations, unspecified: Secondary | ICD-10-CM

## 2016-07-27 ENCOUNTER — Ambulatory Visit: Payer: BLUE CROSS/BLUE SHIELD | Admitting: Family Medicine

## 2016-07-27 ENCOUNTER — Encounter: Payer: Self-pay | Admitting: Family Medicine

## 2016-07-27 VITALS — Ht 70.0 in | Wt 251.0 lb

## 2016-07-27 DIAGNOSIS — I63312 Cerebral infarction due to thrombosis of left middle cerebral artery: Secondary | ICD-10-CM

## 2016-07-27 DIAGNOSIS — I6381 Other cerebral infarction due to occlusion or stenosis of small artery: Secondary | ICD-10-CM

## 2016-07-27 DIAGNOSIS — I10 Essential (primary) hypertension: Secondary | ICD-10-CM

## 2016-07-27 DIAGNOSIS — F321 Major depressive disorder, single episode, moderate: Secondary | ICD-10-CM

## 2016-07-27 DIAGNOSIS — E785 Hyperlipidemia, unspecified: Secondary | ICD-10-CM

## 2016-07-27 DIAGNOSIS — E119 Type 2 diabetes mellitus without complications: Secondary | ICD-10-CM

## 2016-07-27 MED ORDER — ESCITALOPRAM OXALATE 20 MG PO TABS: 20.0000 mg | ORAL_TABLET | Freq: Every day | ORAL | 5 refills | Status: DC

## 2016-07-27 NOTE — Progress Notes (Unsigned)
   Subjective:    Patient ID: Shannon Price, female    DOB: 1960-09-26, 56 y.o.   MRN: [id number]  Diabetes  She presents for her follow-up diabetic visit. She has type 2 diabetes mellitus. Risk factors for coronary artery disease include dyslipidemia, hypertension, post-menopausal and obesity. Current diabetic treatment includes oral agent (dual therapy). She is compliant with treatment all of the time. Her weight is stable. She is following a diabetic diet.   6.3  Staying active with soe exercise   Gets tired   .lip  .htn Blood pressure medicine and blood pressure levels reviewed today with patient. Compliant with blood pressure medicine. States does not miss a dose. No obvious side effects. Blood pressure generally good when checked elsewhere. Watching salt intake.   Patient continues to take lipid medication regularly. No obvious side effects from it. Generally does not miss a dose. Prior blood work results are reviewed with patient. Patient continues to work on fat intake in diet  No recen eye doc visit   Patient claims compliance with diabetes medication. No obvious side effects. Reports no substantial low sugar spells. Most numbers are generally in good range when checked fasting. Generally does not miss a dose of medication. Watching diabetic diet closely  Unfortunately still smoking. No new stroke symptomatology  Review of Systems No headache, no major weight loss or weight gain, no chest pain no back pain abdominal pain no change in bowel habits complete ROS otherwise negative     Objective:   Physical Exam  Alert and oriented, vitals reviewed and stable, NAD ENT-TM's and ext canals WNL bilat via otoscopic exam Soft palate, tonsils and post pharynx WNL via oropharyngeal exam Neck-symmetric, no masses; thyroid nonpalpable and nontender Pulmonary-no tachypnea or accessory muscle use; Clear without wheezes via auscultation Card--no abnrml murmurs, rhythm reg and rate  WNL Carotid pulses symmetric, without bruits       Assessment & Plan:  Impression 1 diabetes good control discussed maintain same therapy #2 hypertension good control discussed maintain same #3 hyperlipidemia prior blood work reviewed maintain same #4 history of stroke discussed #5 depression clinically improved per patient. Still not ideal. Amazingly we made referral months: Still is yet to see a clinician. Will go ahead and increase the Lexapro rationale discussed

## 2016-10-26 ENCOUNTER — Ambulatory Visit (INDEPENDENT_AMBULATORY_CARE_PROVIDER_SITE_OTHER): Payer: BLUE CROSS/BLUE SHIELD | Admitting: Family Medicine

## 2016-10-26 ENCOUNTER — Encounter: Payer: Self-pay | Admitting: Family Medicine

## 2016-10-26 VITALS — BP 138/82 | Ht 70.0 in | Wt 262.0 lb

## 2016-10-26 DIAGNOSIS — Z23 Encounter for immunization: Secondary | ICD-10-CM

## 2016-10-26 DIAGNOSIS — F321 Major depressive disorder, single episode, moderate: Secondary | ICD-10-CM

## 2016-10-26 DIAGNOSIS — I10 Essential (primary) hypertension: Secondary | ICD-10-CM

## 2016-10-26 DIAGNOSIS — Z79899 Other long term (current) drug therapy: Secondary | ICD-10-CM

## 2016-10-26 DIAGNOSIS — E785 Hyperlipidemia, unspecified: Secondary | ICD-10-CM

## 2016-10-26 DIAGNOSIS — E119 Type 2 diabetes mellitus without complications: Secondary | ICD-10-CM

## 2016-10-26 LAB — POCT GLYCOSYLATED HEMOGLOBIN (HGB A1C): HEMOGLOBIN A1C: 5.6

## 2016-10-26 NOTE — Progress Notes (Signed)
   Subjective:    Patient ID: Shannon Price, female    DOB: 1960/04/13, 56 y.o.   MRN: [id number]  Diabetes  She presents for her follow-up diabetic visit. She has type 2 diabetes mellitus. Risk factors for coronary artery disease include diabetes mellitus, dyslipidemia, hypertension, obesity and post-menopausal. Current diabetic treatment includes oral agent (dual therapy). She is compliant with treatment all of the time. Her weight is stable. She is following a diabetic diet.   Patient claims compliance with diabetes medication. No obvious side effects. Reports no substantial low sugar spells. Most numbers are generally in good range when checked fasting. Generally does not miss a dose of medication. Watching diabetic diet closely  Blood pressure medicine and blood pressure levels reviewed today with patient. Compliant with blood pressure medicine. States does not miss a dose. No obvious side effects. Blood pressure generally good when checked elsewhere. Watching salt intake.   Patient continues to take lipid medication regularly. No obvious side effects from it. Generally does not miss a dose. Prior blood work results are reviewed with patient. Patient continues to work on fat intake in diet  Results for orders placed or performed in visit on 10/26/16  POCT glycosylated hemoglobin (Hb A1C)  Result Value Ref Range   Hemoglobin A1C 5.6    Patient notes ongoing compliance with antidepressant medication. No obvious side effects. Reports does not miss a dose. Overall continues to help depression substantially. No thoughts of homicide or suicide. Would like to maintain medication.  Pt notes the lexapro overall is better     Review of Systems No headache, no major weight loss or weight gain, no chest pain no back pain abdominal pain no change in bowel habits complete ROS otherwise negative     Objective:   Physical Exam Alert and oriented, vitals reviewed and stable, NAD ENT-TM's and ext  canals WNL bilat via otoscopic exam Soft palate, tonsils and post pharynx WNL via oropharyngeal exam Neck-symmetric, no masses; thyroid nonpalpable and nontender Pulmonary-no tachypnea or accessory muscle use; Clear without wheezes via auscultation Card--no abnrml murmurs, rhythm reg and rate WNL Carotid pulses symmetric, without bruits        Assessment & Plan:  mpression 1 type 2 diabetescontrol good per patient. Morning sugars generally type.  #2 hypertension good control discussed maintain same meds  #3 hyperlipidemia. Status uncertain. Prior blood work reviewed. Maintain same pending.  #4depression. Patient states overall has improved considerably on Lexapro. Has decided she does not want to go to psychiatrist. No thoughts of suicide major depression at this time  Plan flu shot today. Appropriate blood work. Medications refilled. Encouraged to stop smoking/preventative checkup with Hoyle Sauer

## 2016-12-07 ENCOUNTER — Ambulatory Visit: Payer: BLUE CROSS/BLUE SHIELD | Admitting: Family Medicine

## 2016-12-21 ENCOUNTER — Encounter: Payer: BLUE CROSS/BLUE SHIELD | Admitting: Nurse Practitioner

## 2017-02-01 ENCOUNTER — Encounter: Payer: Self-pay | Admitting: Nurse Practitioner

## 2017-02-01 ENCOUNTER — Ambulatory Visit (INDEPENDENT_AMBULATORY_CARE_PROVIDER_SITE_OTHER): Payer: Medicare HMO | Admitting: Nurse Practitioner

## 2017-02-01 VITALS — BP 140/88 | Ht 70.0 in | Wt 269.4 lb

## 2017-02-01 DIAGNOSIS — Z1151 Encounter for screening for human papillomavirus (HPV): Secondary | ICD-10-CM | POA: Diagnosis not present

## 2017-02-01 DIAGNOSIS — Z1231 Encounter for screening mammogram for malignant neoplasm of breast: Secondary | ICD-10-CM

## 2017-02-01 DIAGNOSIS — Z0001 Encounter for general adult medical examination with abnormal findings: Secondary | ICD-10-CM | POA: Diagnosis not present

## 2017-02-01 DIAGNOSIS — Z01419 Encounter for gynecological examination (general) (routine) without abnormal findings: Secondary | ICD-10-CM

## 2017-02-01 DIAGNOSIS — L732 Hidradenitis suppurativa: Secondary | ICD-10-CM | POA: Insufficient documentation

## 2017-02-01 DIAGNOSIS — E119 Type 2 diabetes mellitus without complications: Secondary | ICD-10-CM | POA: Diagnosis not present

## 2017-02-01 DIAGNOSIS — Z124 Encounter for screening for malignant neoplasm of cervix: Secondary | ICD-10-CM

## 2017-02-01 DIAGNOSIS — Z1211 Encounter for screening for malignant neoplasm of colon: Secondary | ICD-10-CM | POA: Diagnosis not present

## 2017-02-01 LAB — POCT GLYCOSYLATED HEMOGLOBIN (HGB A1C): Hemoglobin A1C: 7.8

## 2017-02-01 MED ORDER — DOXYCYCLINE HYCLATE 100 MG PO TABS: 100.0000 mg | ORAL_TABLET | Freq: Two times a day (BID) | ORAL | 0 refills | Status: DC

## 2017-02-01 NOTE — Patient Instructions (Addendum)
Glipizide one in the morning; 1/2 pill at night   Hidradenitis Suppurativa Hidradenitis suppurativa is a long-term (chronic) skin disease that starts with blocked sweat glands or hair follicles. Bacteria may grow in these blocked openings of your skin. Hidradenitis suppurativa is like a severe form of acne that develops in areas of your body where acne would be unusual. It is most likely to affect the areas of your body where skin rubs against skin and becomes moist. This includes your:  Underarms.  Groin.  Genital areas.  Buttocks.  Upper thighs.  Breasts.  Hidradenitis suppurativa may start out with small pimples. The pimples can develop into deep sores that break open (rupture) and drain pus. Over time your skin may thicken and become scarred. Hidradenitis suppurativa cannot be passed from person to person. What are the causes? The exact cause of hidradenitis suppurativa is not known. This condition may be due to:  Female and female hormones. The condition is rare before and after puberty.  An overactive body defense system (immune system). Your immune system may overreact to the blocked hair follicles or sweat glands and cause swelling and pus-filled sores.  What increases the risk? You may have a higher risk of hidradenitis suppurativa if you:  Are a woman.  Are between ages 63 and 27.  Have a family history of hidradenitis suppurativa.  Have a personal history of acne.  Are overweight.  Smoke.  Take the drug lithium.  What are the signs or symptoms? The first signs of an outbreak are usually painful skin bumps that look like pimples. As the condition progresses:  Skin bumps may get bigger and grow deeper into the skin.  Bumps under the skin may rupture and drain smelly pus.  Skin may become itchy and infected.  Skin may thicken and scar.  Drainage may continue through tunnels under the skin (fistulas).  Walking and moving your arms can become  painful.  How is this diagnosed? Your health care provider may diagnose hidradenitis suppurativa based on your medical history and your signs and symptoms. A physical exam will also be done. You may need to see a health care provider who specializes in skin diseases (dermatologist). You may also have tests done to confirm the diagnosis. These can include:  Swabbing a sample of pus or drainage from your skin so it can be sent to the lab and tested for infection.  Blood tests to check for infection.  How is this treated? The same treatment will not work for everybody with hidradenitis suppurativa. Your treatment will depend on how severe your symptoms are. You may need to try several treatments to find what works best for you. Part of your treatment may include cleaning and bandaging (dressing) your wounds. You may also have to take medicines, such as the following:  Antibiotics.  Acne medicines.  Medicines to block or suppress the immune system.  A diabetes medicine (metformin) is sometimes used to treat this condition.  For women, birth control pills can sometimes help relieve symptoms.  You may need surgery if you have a severe case of hidradenitis suppurativa that does not respond to medicine. Surgery may involve:  Using a laser to clear the skin and remove hair follicles.  Opening and draining deep sores.  Removing the areas of skin that are diseased and scarred.  Follow these instructions at home:  Learn as much as you can about your disease, and work closely with your health care providers.  Take medicines only as  directed by your health care provider.  If you were prescribed an antibiotic medicine, finish it all even if you start to feel better.  If you are overweight, losing weight may be very helpful. Try to reach and maintain a healthy weight.  Do not use any tobacco products, including cigarettes, chewing tobacco, or electronic cigarettes. If you need help quitting,  ask your health care provider.  Do not shave the areas where you get hidradenitis suppurativa.  Do not wear deodorant.  Wear loose-fitting clothes.  Try not to overheat and get sweaty.  Take a daily bleach bath as directed by your health care provider. ? Fill your bathtub halfway with water. ? Pour in  cup of unscented household bleach. ? Soak for 5-10 minutes.  Cover sore areas with a warm, clean washcloth (compress) for 5-10 minutes. Contact a health care provider if:  You have a flare-up of hidradenitis suppurativa.  You have chills or a fever.  You are having trouble controlling your symptoms at home. This information is not intended to replace advice given to you by your health care provider. Make sure you discuss any questions you have with your health care provider. Document Released: 08/09/2003 Document Revised: 06/02/2015 Document Reviewed: 03/27/2013 Elsevier Interactive Patient Education  2018 [employer].

## 2017-02-03 ENCOUNTER — Encounter: Payer: Self-pay | Admitting: Nurse Practitioner

## 2017-02-03 NOTE — Progress Notes (Signed)
Subjective:    Patient ID: Shannon Price, female    DOB: 03-23-1960, 57 y.o.   MRN: [id number]  HPI presents for her wellness exam. Has not taken her BP med this am. Has been post menopausal for about 4 years. Has slight bleeding only with intercourse. No pelvic pain. Pain during intercourse only at the opening to the vagina. Has had limited care recently due to lack of insurance. Needs vision exam. Has limited teeth. No dental care. Denies mouth pain or sores. Saw Dr. Modesta Messing in May but could not make follow up appointment due to transportation issues. Would like to reschedule. Very limited activity due to health issues. Has gained significant weight.  Continues to smoke; over 39 years.    Review of Systems  Constitutional: Positive for activity change and fatigue. Negative for appetite change.  HENT: Negative for dental problem, ear pain, mouth sores, sinus pressure, sore throat, trouble swallowing and voice change.   Respiratory: Negative for cough, chest tightness, shortness of breath and wheezing.   Cardiovascular: Negative for chest pain.  Gastrointestinal: Negative for abdominal distention, abdominal pain, blood in stool, constipation, diarrhea, nausea and vomiting.  Genitourinary: Positive for dyspareunia, enuresis, frequency and urgency. Negative for difficulty urinating, dysuria, genital sores, pelvic pain and vaginal discharge.       Mild urinary leakage at times.   Skin:       Skin changes noted in the axillary area. Has recurrent boils and small abscesses.        Objective:   Physical Exam  Constitutional: She is oriented to person, place, and time. She appears well-developed. No distress.  HENT:  Right Ear: External ear normal.  Left Ear: External ear normal.  Mouth/Throat: Oropharynx is clear and moist.  Neck: Normal range of motion. Neck supple. No tracheal deviation present. No thyromegaly present.  Cardiovascular: Normal rate, regular rhythm and normal heart sounds.  Exam reveals no gallop.  No murmur heard. Carotids no bruits or thrills.   Pulmonary/Chest: Effort normal and breath sounds normal. Right breast exhibits no inverted nipple, no mass, no skin change and no tenderness. Left breast exhibits no inverted nipple, no mass, no skin change and no tenderness. Breasts are symmetrical.  Axillae no adenopathy.  Abdominal: Soft. She exhibits no distension. There is no tenderness.  Genitourinary: Vagina normal and uterus normal. No vaginal discharge found.  Genitourinary Comments: External GU: no rashes or lesions. Skin pale. Vagina: tissue pale, no lesions or discharge. Cervix normal in appearance. No CMT. Tight muscles at introitus. No evidence of cystocele.   Musculoskeletal: She exhibits no edema.  Lymphadenopathy:    She has no cervical adenopathy.  Neurological: She is alert and oriented to person, place, and time.  Skin: Skin is warm and dry. Rash noted.  Multiple areas of scarring with tiny dark pitting consistent with hidradenitis noted in axillary areas, lower abdominal fold and upper inner thighs. No current abscess noted.   Psychiatric: She has a normal mood and affect. Her behavior is normal.  Vitals reviewed.  Results for orders placed or performed in visit on 02/01/17  POCT HgB A1C  7.8                                         Assessment & Plan:   Problem List Items Addressed This Visit      Endocrine   Diabetes mellitus without complication (  [place])   Relevant Orders   POCT HgB A1C (Completed)     Musculoskeletal and Integument   Hidradenitis suppurativa    Other Visit Diagnoses    Well woman exam    -  Primary   Relevant Orders   Pap IG and HPV (high risk) DNA detection (Completed)   Screening for cervical cancer       Relevant Orders   Pap IG and HPV (high risk) DNA detection (Completed)   Screening for HPV (human papillomavirus)       Relevant Orders   Pap IG and HPV (high risk) DNA detection (Completed)   Encounter  for screening mammogram for breast cancer       Relevant Orders   MM DIGITAL SCREENING BILATERAL   Screening for colon cancer       Relevant Orders   Ambulatory referral to Gastroenterology     Refer for screening colonoscopy. Mammogram scheduled.  Meds ordered this encounter  Medications  . doxycycline (VIBRA-TABS) 100 MG tablet    Sig: Take 1 tablet (100 mg total) by mouth 2 (two) times daily.    Dispense:  20 tablet    Refill:  0    Order Specific Question:   Supervising Provider    Answer:   Maggie Font   Sent in antibiotics to have on hand in case she develops skin infection.  Plan to discuss lung cancer screening per Medicare guidelines once she turns 20.  Will send note to referral coordinator about rescheduling psych appt.  Consider having the remainder of her dentition removed and denture fitting.  Discussed importance of regular activity. Encouraged patient to walk at least 5-10 minutes per day on her treadmill. She was unable to take Glipizide one pill bid due to hypoglycemia. Take one in the morning and half pill at night. Monitor BS.  Return in about 3 months (around 05/02/2017) for diabetes check up. Call back sooner if needed.

## 2017-02-04 ENCOUNTER — Telehealth: Payer: Self-pay | Admitting: Family Medicine

## 2017-02-04 ENCOUNTER — Encounter: Payer: Self-pay | Admitting: Family Medicine

## 2017-02-04 NOTE — Telephone Encounter (Signed)
Per Hoyle Sauer - please give pt # to Dr. Ivor Reining office  [facility] - just asked pt to call me back Need to give # for Dr. Modesta Messing [phone]

## 2017-02-05 LAB — PAP IG AND HPV HIGH-RISK
HPV, high-risk: NEGATIVE
PAP Smear Comment: 0

## 2017-02-06 ENCOUNTER — Ambulatory Visit (HOSPITAL_COMMUNITY): Payer: Self-pay

## 2017-02-19 ENCOUNTER — Ambulatory Visit: Payer: Self-pay

## 2017-02-26 ENCOUNTER — Other Ambulatory Visit: Payer: Self-pay | Admitting: *Deleted

## 2017-02-26 MED ORDER — LISINOPRIL-HYDROCHLOROTHIAZIDE 20-12.5 MG PO TABS: 1.0000 | ORAL_TABLET | Freq: Every day | ORAL | 0 refills | Status: DC

## 2017-03-11 ENCOUNTER — Encounter: Payer: Self-pay | Admitting: *Deleted

## 2017-03-11 ENCOUNTER — Telehealth: Payer: Self-pay | Admitting: *Deleted

## 2017-03-11 ENCOUNTER — Ambulatory Visit: Payer: Self-pay

## 2017-03-11 NOTE — Telephone Encounter (Signed)
noted 

## 2017-03-11 NOTE — Telephone Encounter (Signed)
PATIENT WAS A NO SHOW AND LETTER SENT  °

## 2017-04-23 ENCOUNTER — Other Ambulatory Visit: Payer: Self-pay

## 2017-04-23 ENCOUNTER — Other Ambulatory Visit: Payer: Self-pay | Admitting: Family Medicine

## 2017-04-23 MED ORDER — METFORMIN HCL 1000 MG PO TABS: 1000.0000 mg | ORAL_TABLET | Freq: Two times a day (BID) | ORAL | 1 refills | Status: DC

## 2017-05-03 ENCOUNTER — Ambulatory Visit (INDEPENDENT_AMBULATORY_CARE_PROVIDER_SITE_OTHER): Payer: Medicare HMO | Admitting: Nurse Practitioner

## 2017-05-03 ENCOUNTER — Encounter: Payer: Self-pay | Admitting: Nurse Practitioner

## 2017-05-03 VITALS — BP 144/92 | Ht 70.0 in | Wt 269.4 lb

## 2017-05-03 DIAGNOSIS — I10 Essential (primary) hypertension: Secondary | ICD-10-CM

## 2017-05-03 DIAGNOSIS — E785 Hyperlipidemia, unspecified: Secondary | ICD-10-CM

## 2017-05-03 DIAGNOSIS — E119 Type 2 diabetes mellitus without complications: Secondary | ICD-10-CM | POA: Diagnosis not present

## 2017-05-03 DIAGNOSIS — Z1231 Encounter for screening mammogram for malignant neoplasm of breast: Secondary | ICD-10-CM

## 2017-05-03 DIAGNOSIS — Z1211 Encounter for screening for malignant neoplasm of colon: Secondary | ICD-10-CM | POA: Diagnosis not present

## 2017-05-03 DIAGNOSIS — Z122 Encounter for screening for malignant neoplasm of respiratory organs: Secondary | ICD-10-CM

## 2017-05-03 LAB — POCT GLYCOSYLATED HEMOGLOBIN (HGB A1C): HEMOGLOBIN A1C: 6.9

## 2017-05-04 ENCOUNTER — Encounter: Payer: Self-pay | Admitting: Nurse Practitioner

## 2017-05-04 NOTE — Progress Notes (Signed)
Subjective:  Presents for recheck on her diabetes. Adherent to medication regimen. Has not done well with her diet. Trying to be more active; somewhat limited due to right knee pain. Takes BC powder about every other day which helps. No CP/ischemic type pain or SOB. No unusual cough or SOB. No hemoptysis. Current smoker. 78 pack year smoker. Is willing to undergo biopsy or surgery for lung issues if needed. States her BP is elevated today because she was upset about her pet before coming to office.   Objective:   BP (!) 144/92   Ht 5\' 10"  (1.778 m)   Wt 269 lb 6.4 oz (122.2 kg)   BMI 38.65 kg/m  NAD. Alert, oriented. Lungs clear. Heart RRR. LE: no edema. Significant central obesity.  Results for orders placed or performed in visit on 05/03/17  POCT HgB A1C  Result Value Ref Range   Hemoglobin A1C 6.9    Down from 7.8 in January.    Assessment:   Problem List Items Addressed This Visit      Cardiovascular and Mediastinum   Essential hypertension, benign   Relevant Orders   Basic Metabolic Panel (BMET)     Endocrine   Diabetes mellitus without complication ([place]) - Primary   Relevant Orders   POCT HgB A1C (Completed)   Urine Microalbumin w/creat. ratio   Hepatic function panel     Other   Hyperlipidemia LDL goal <100   Relevant Orders   Hepatic function panel   Lipid Profile   Morbid obesity ([place])    Other Visit Diagnoses    Screening mammogram, encounter for       Relevant Orders   MM Digital Screening   Encounter for screening for lung cancer       Relevant Orders   Ambulatory referral to Pulmonology   Screening for colon cancer       Relevant Orders   Ambulatory referral to Gastroenterology       Plan:  Defers orthopedic referral at this time. Can make her own appointment with office in [place] when she chooses. Encouraged healthy diet and regular activity. Mammogram scheduled. Also referred for colonoscopy and low dose CT of the lungs.  Return in about 3 months  (around 08/02/2017) for diabetes check up.

## 2017-05-09 ENCOUNTER — Ambulatory Visit (HOSPITAL_COMMUNITY): Payer: Self-pay

## 2017-05-12 ENCOUNTER — Other Ambulatory Visit: Payer: Self-pay | Admitting: Family Medicine

## 2017-05-15 ENCOUNTER — Encounter: Payer: Self-pay | Admitting: Family Medicine

## 2017-05-16 ENCOUNTER — Encounter: Payer: Self-pay | Admitting: Gastroenterology

## 2017-05-27 ENCOUNTER — Ambulatory Visit (HOSPITAL_COMMUNITY)
Admission: RE | Admit: 2017-05-27 | Discharge: 2017-05-27 | Disposition: A | Discharge status: Home / Self Care | Payer: Medicare HMO | Source: Ambulatory Visit | Attending: Nurse Practitioner | Admitting: Nurse Practitioner

## 2017-05-27 ENCOUNTER — Other Ambulatory Visit: Payer: Self-pay | Admitting: Acute Care

## 2017-05-27 ENCOUNTER — Encounter (HOSPITAL_COMMUNITY): Payer: Self-pay

## 2017-05-27 DIAGNOSIS — Z1231 Encounter for screening mammogram for malignant neoplasm of breast: Secondary | ICD-10-CM | POA: Diagnosis not present

## 2017-05-27 DIAGNOSIS — Z122 Encounter for screening for malignant neoplasm of respiratory organs: Secondary | ICD-10-CM

## 2017-05-27 DIAGNOSIS — F17210 Nicotine dependence, cigarettes, uncomplicated: Secondary | ICD-10-CM

## 2017-05-28 ENCOUNTER — Other Ambulatory Visit: Payer: Self-pay | Admitting: Nurse Practitioner

## 2017-05-28 DIAGNOSIS — R928 Other abnormal and inconclusive findings on diagnostic imaging of breast: Secondary | ICD-10-CM

## 2017-05-29 ENCOUNTER — Other Ambulatory Visit: Payer: Self-pay | Admitting: Nurse Practitioner

## 2017-05-29 DIAGNOSIS — R928 Other abnormal and inconclusive findings on diagnostic imaging of breast: Secondary | ICD-10-CM

## 2017-06-04 ENCOUNTER — Encounter (HOSPITAL_COMMUNITY): Payer: Self-pay

## 2017-06-07 ENCOUNTER — Telehealth: Payer: Self-pay | Admitting: Acute Care

## 2017-06-10 ENCOUNTER — Encounter: Payer: Self-pay | Admitting: Acute Care

## 2017-06-10 ENCOUNTER — Inpatient Hospital Stay: Admission: RE | Admit: 2017-06-10 | Payer: Self-pay | Source: Ambulatory Visit

## 2017-06-10 NOTE — Telephone Encounter (Signed)
LMTC x 1  

## 2017-06-11 NOTE — Telephone Encounter (Signed)
ATC pt, no answer. Left message for pt to call back.  Denise please follow up

## 2017-06-12 ENCOUNTER — Telehealth: Payer: Self-pay | Admitting: Family Medicine

## 2017-06-12 DIAGNOSIS — F321 Major depressive disorder, single episode, moderate: Secondary | ICD-10-CM

## 2017-06-12 NOTE — Telephone Encounter (Signed)
Refill request for Lexapro 20mg . Take one tab by mouth once daily The note to prescriber says "is pt still taking?has not filled since April." Please advise.

## 2017-06-12 NOTE — Telephone Encounter (Signed)
Spoke with pt and rescheduled [facility] 06/24/17 3:00 CT will be rescheduled Nothing further needed

## 2017-06-13 MED ORDER — ESCITALOPRAM OXALATE 20 MG PO TABS: 20.0000 mg | ORAL_TABLET | Freq: Every day | ORAL | 5 refills | Status: DC

## 2017-06-13 NOTE — Addendum Note (Signed)
Addended by: Karle Barr on: 06/13/2017 09:03 AM   Modules accepted: Orders

## 2017-06-13 NOTE — Telephone Encounter (Signed)
Refill times six mo

## 2017-06-18 ENCOUNTER — Ambulatory Visit (HOSPITAL_COMMUNITY)
Admission: RE | Admit: 2017-06-18 | Discharge: 2017-06-18 | Disposition: A | Discharge status: Home / Self Care | Payer: Medicare HMO | Source: Ambulatory Visit | Attending: Nurse Practitioner | Admitting: Nurse Practitioner

## 2017-06-18 ENCOUNTER — Ambulatory Visit: Payer: Medicare HMO

## 2017-06-18 DIAGNOSIS — R928 Other abnormal and inconclusive findings on diagnostic imaging of breast: Secondary | ICD-10-CM

## 2017-06-18 DIAGNOSIS — N6002 Solitary cyst of left breast: Secondary | ICD-10-CM | POA: Diagnosis not present

## 2017-06-21 IMAGING — MR MR HEAD W/O CM
12 series · 43 of 48 positions shown · non-contrast
Comparison: MRI head 07/22/2013

CLINICAL DATA: Bilateral hand numbness and weakness. Hypertension,
high cholesterol, diabetes, smoker, history of stroke

EXAM:
MRI HEAD WITHOUT CONTRAST
TECHNIQUE: Multiplanar, multiecho pulse sequences of the brain and surrounding
structures were obtained without intravenous contrast.

[Series 2: t1_fl2d_sag · sagittal · 5.0mm · 0.39mm/px · 1 of 20 slices shown]
[im 1/20]
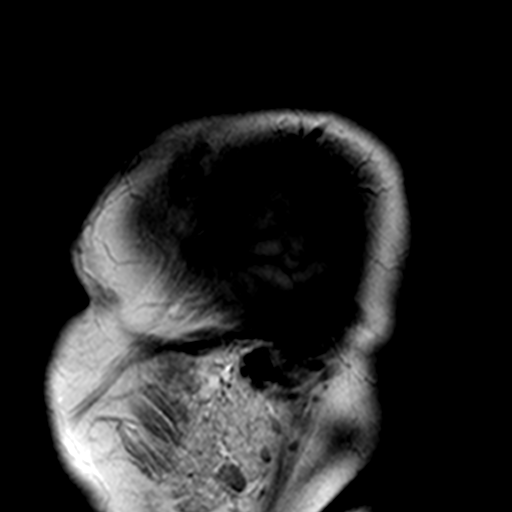

[Series 5: T2 · axial · 5.0mm · 0.64mm/px · z∈[-54,+89]mm · 2 of 23 slices shown (1 of 2)]
[im 1/23]
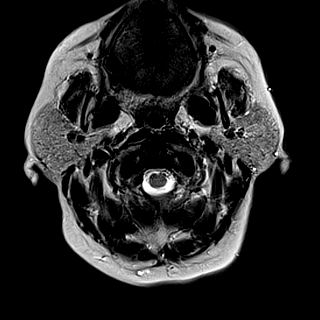
[im 23/23]
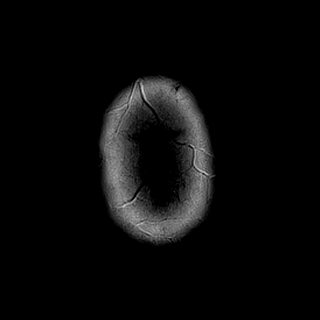

[Series 6: FLAIR · axial · 5.0mm · 0.78mm/px · z∈[-54,+89]mm · 2 of 23 slices shown]
[im 1/23]
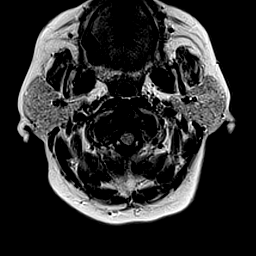
[im 23/23]
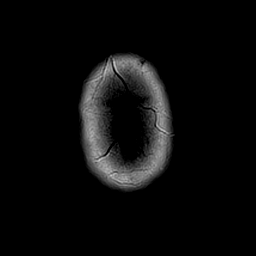

[Series 7: T1 · axial · 2.0mm · 0.41mm/px · z∈[-59,+95]mm · 7 of 78 slices shown]
[im 1/78]
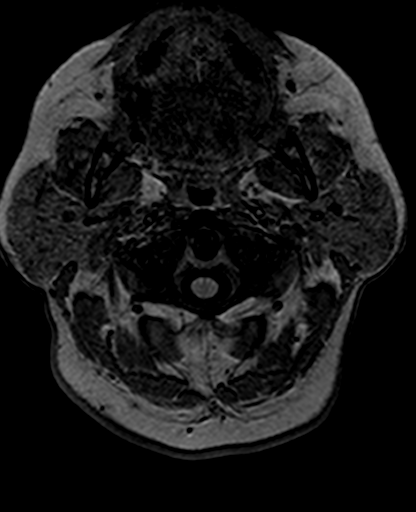
[im 13/78]
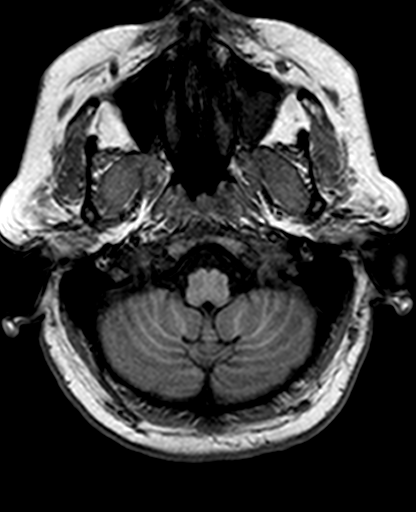
[im 26/78]
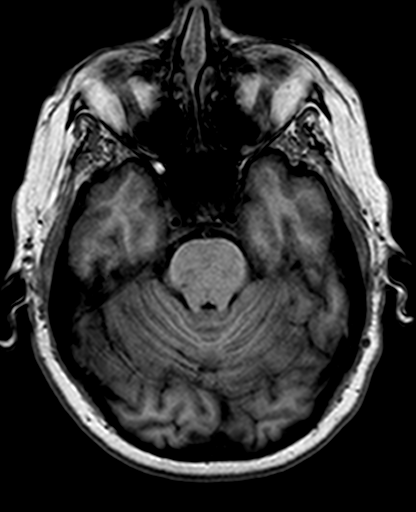
[im 39/78]
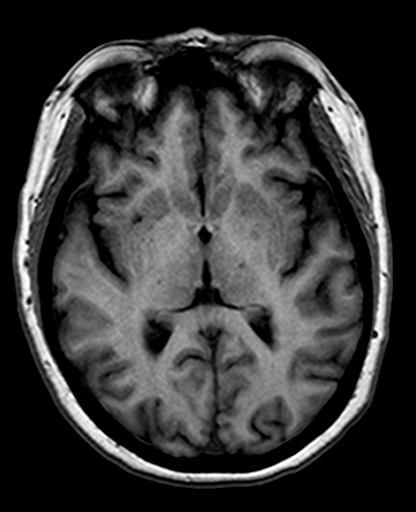
[im 52/78]
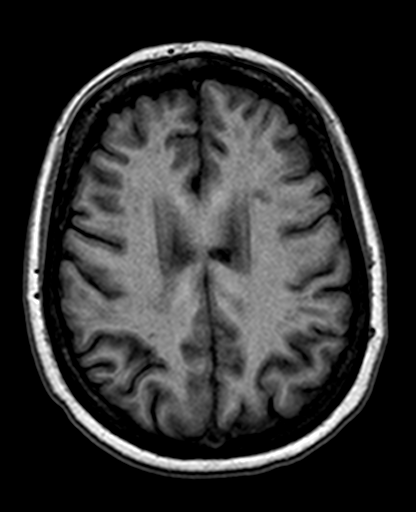
[im 65/78]
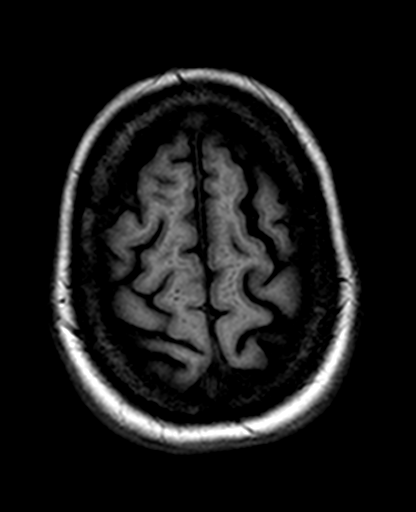
[im 78/78]
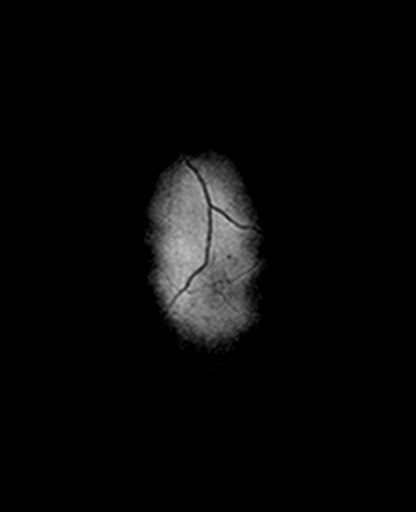

[Series 8: trauma axial · axial · 5.0mm · 0.39mm/px · z∈[-47,+83]mm · 2 of 21 slices shown]
[im 1/21]
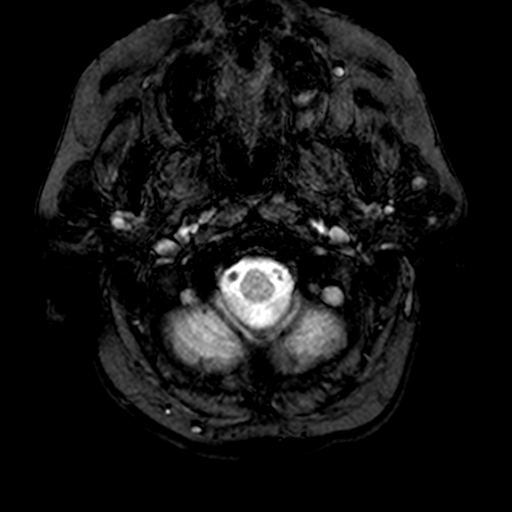
[im 21/21]
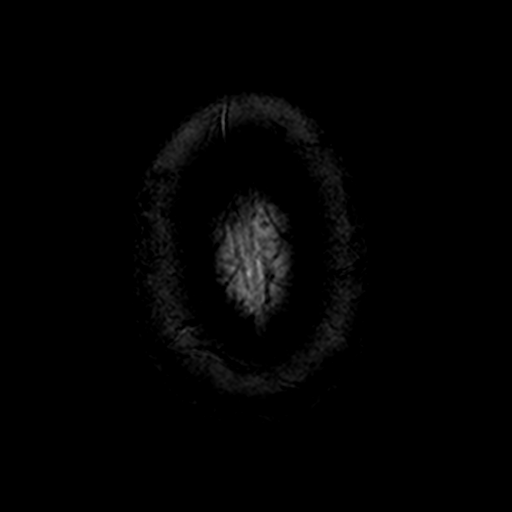

[Series 9: T2 · coronal · 5.0mm · 0.54mm/px · 3 of 28 slices shown (2 of 2)]
[im 1/28]
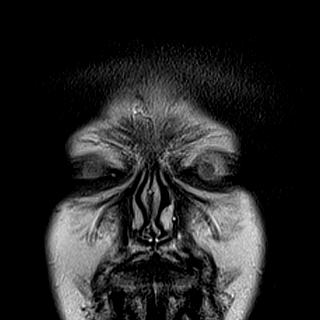
[im 14/28]
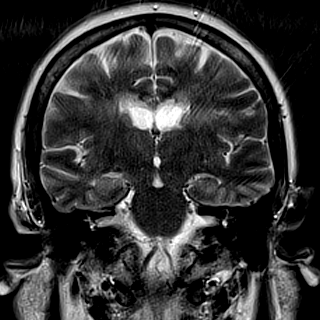
[im 28/28]
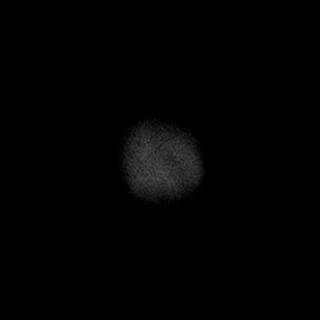

[Series 100: <mpr thick range> · axial · 3.0mm · 0.70mm/px · z∈[-50,+89]mm · 4 of 47 slices shown]
[im 1/47]
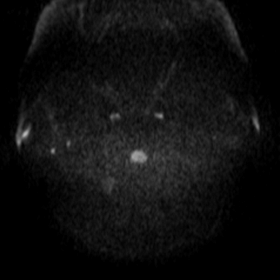
[im 16/47]
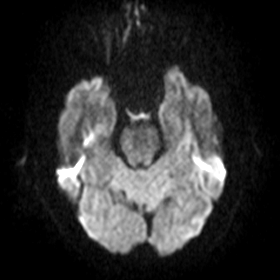
[im 31/47]
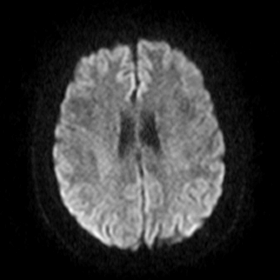
[im 47/47]
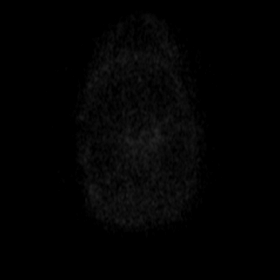

[Series 101: <mpr thick range(1)> · coronal · 3.0mm · 0.61mm/px · 5 of 54 slices shown]
[im 1/54]
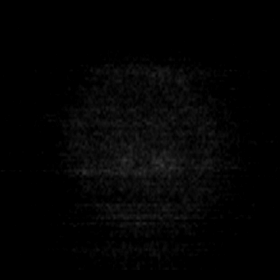
[im 14/54]
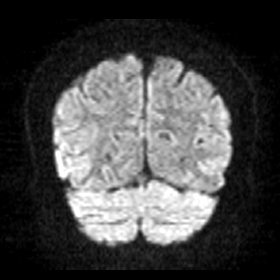
[im 27/54]
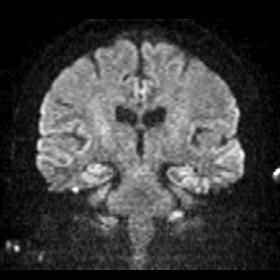
[im 40/54]
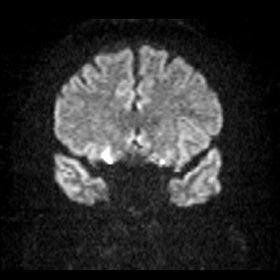
[im 54/54]
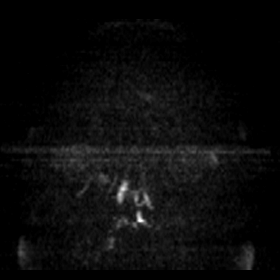

[Series 102: <mpr thick range(2)> · axial · 3.0mm · 0.70mm/px · z∈[-50,+89]mm · 4 of 45 slices shown]
[im 1/45]
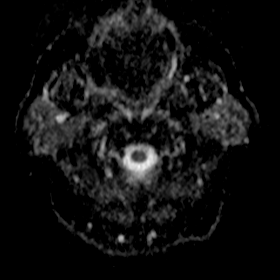
[im 15/45]
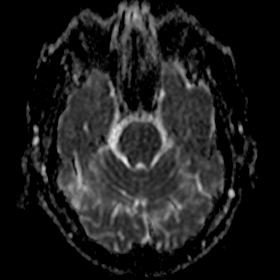
[im 30/45]
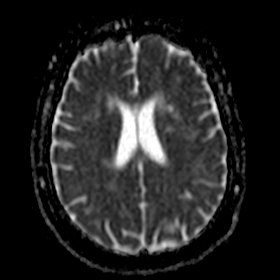
[im 45/45]
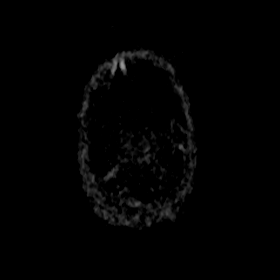

[Series 103: <mpr thick range(3)> · coronal · 3.0mm · 0.61mm/px · 5 of 53 slices shown]
[im 1/53]
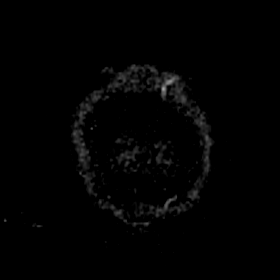
[im 14/53]
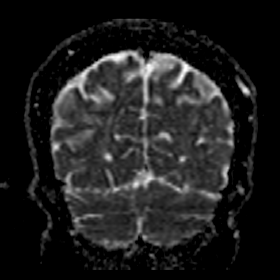
[im 27/53]
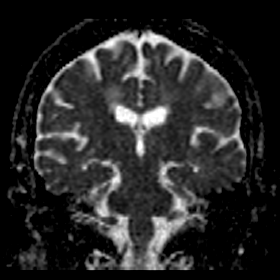
[im 40/53]
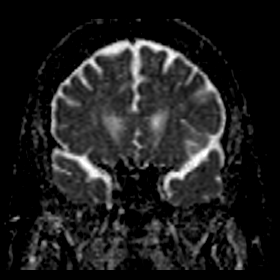
[im 53/53]
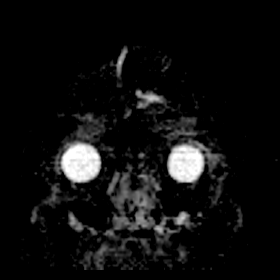

[Series 104: <mpr thick range(4)> · axial · 2.0mm · 0.79mm/px · z∈[-56,+84]mm · 6 of 71 slices shown]
[im 1/71]
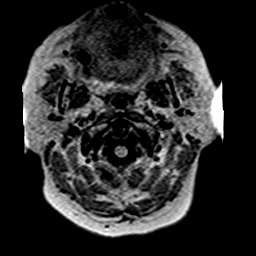
[im 15/71]
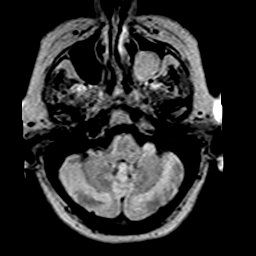
[im 29/71]
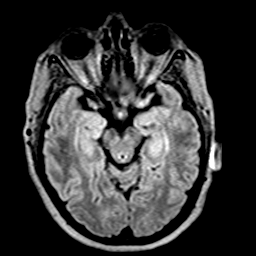
[im 43/71]
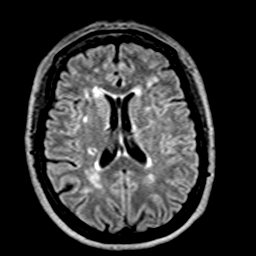
[im 57/71]
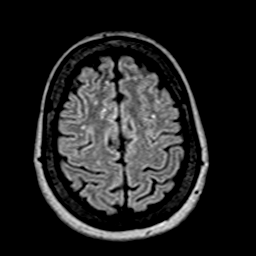
[im 71/71]
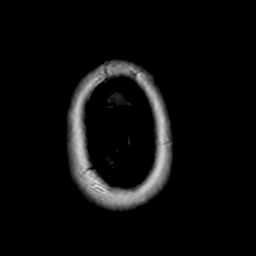

[Series 105: <mpr thick range(5)> · sagittal · 2.0mm · 0.79mm/px · 2 of 74 slices shown]
[im 1/74]
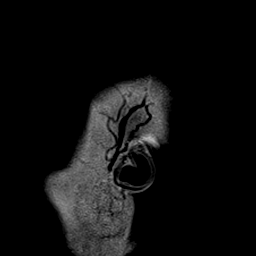
[im 13/74]
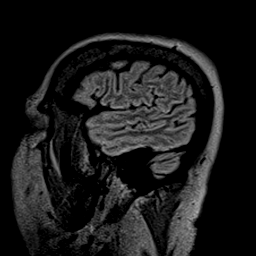

[43 of 48 positions shown; findings below may reference images not displayed]

FINDINGS: Negative for acute infarct.

Ventricle size normal.  Cerebral volume normal.

Multiple periventricular and deep white matter hyperintensities are
present. Most of these are similar to the prior MRI with progression
of lesion in the corpus callosum extending across the midline.
Hyperintensities in the basal ganglia bilaterally compatible with
chronic ischemia. Small chronic infarct left cerebellum. Brainstem
intact.

Negative for hemorrhage or mass. No shift of the midline structures.

Pituitary normal in size. Mucous retention cyst left maxillary sinus
is slightly smaller but was present previously. Small retention cyst
on the right. Normal orbit.
IMPRESSION: Moderate chronic microvascular ischemic changes in the white matter
with mild progression since the prior MRI.

No acute infarct.

## 2017-06-24 ENCOUNTER — Ambulatory Visit (INDEPENDENT_AMBULATORY_CARE_PROVIDER_SITE_OTHER)
Admission: RE | Admit: 2017-06-24 | Discharge: 2017-06-24 | Disposition: A | Discharge status: Home / Self Care | Payer: Medicare HMO | Source: Ambulatory Visit | Attending: Acute Care | Admitting: Acute Care

## 2017-06-24 ENCOUNTER — Ambulatory Visit (INDEPENDENT_AMBULATORY_CARE_PROVIDER_SITE_OTHER): Payer: Medicare HMO | Admitting: Acute Care

## 2017-06-24 ENCOUNTER — Encounter: Payer: Self-pay | Admitting: Acute Care

## 2017-06-24 DIAGNOSIS — F17210 Nicotine dependence, cigarettes, uncomplicated: Secondary | ICD-10-CM

## 2017-06-24 DIAGNOSIS — Z122 Encounter for screening for malignant neoplasm of respiratory organs: Secondary | ICD-10-CM

## 2017-06-24 NOTE — Progress Notes (Signed)
Shared Decision Making Visit Lung Cancer Screening Program [phone])   Eligibility:  Age 57 y.o.  Pack Years Smoking History Calculation 86 pack year smoking hisory (# packs/per year x # years smoked)  Recent History of coughing up blood  no  Unexplained weight loss? no ( >Than 15 pounds within the last 6 months )  Prior History Lung / other cancer no (Diagnosis within the last 5 years already requiring surveillance chest CT Scans).  Smoking Status Current Smoker  Former Smokers: Years since quit: NA  Quit Date: NA  Visit Components:  Discussion included one or more decision making aids. yes  Discussion included risk/benefits of screening. yes  Discussion included potential follow up diagnostic testing for abnormal scans. yes  Discussion included meaning and risk of over diagnosis. yes  Discussion included meaning and risk of False Positives. yes  Discussion included meaning of total radiation exposure. yes  Counseling Included:  Importance of adherence to annual lung cancer LDCT screening. yes  Impact of comorbidities on ability to participate in the program. yes  Ability and willingness to under diagnostic treatment. yes  Smoking Cessation Counseling:  Current Smokers:   Discussed importance of smoking cessation. yes  Information about tobacco cessation classes and interventions provided to patient. yes  Patient provided with "ticket" for LDCT Scan. yes  Symptomatic Patient. no  Counseling  Diagnosis Code: Tobacco Use Z72.0  Asymptomatic Patient yes  Counseling (Intermediate counseling: > three minutes counseling) [id number]  Former Smokers:   Discussed the importance of maintaining cigarette abstinence. yes  Diagnosis Code: Personal History of Nicotine Dependence. E56.314  Information about tobacco cessation classes and interventions provided to patient. Yes  Patient provided with "ticket" for LDCT Scan. yes  Written Order for Lung Cancer  Screening with LDCT placed in Epic. Yes (CT Chest Lung Cancer Screening Low Dose W/O CM) [id number] Z12.2-Screening of respiratory organs Z87.891-Personal history of nicotine dependence  I have spent 25 minutes of face to face time with Shannon Price discussing the risks and benefits of lung cancer screening. We viewed a power point together that explained in detail the above noted topics. We paused at intervals to allow for questions to be asked and answered to ensure understanding.We discussed that the single most powerful action that she can take to decrease her risk of developing lung cancer is to quit smoking. We discussed whether or not she is ready to commit to setting a quit date. We discussed options for tools to aid in quitting smoking including nicotine replacement therapy, non-nicotine medications, support groups, Quit Smart classes, and behavior modification. We discussed that often times setting smaller, more achievable goals, such as eliminating 1 cigarette a day for a week and then 2 cigarettes a day for a week can be helpful in slowly decreasing the number of cigarettes smoked. This allows for a sense of accomplishment as well as providing a clinical benefit. I gave her the " Be Stronger Than Your Excuses" card with contact information for community resources, classes, free nicotine replacement therapy, and access to mobile apps, text messaging, and on-line smoking cessation help. I have also given her my card and contact information in the event she needs to contact me. We discussed the time and location of the scan, and that either Doroteo Glassman RN or I will call with the results within 24-48 hours of receiving them. I have offered her  a copy of the power point we viewed  as a resource in the event they need reinforcement of  the concepts we discussed today in the office. The patient verbalized understanding of all of  the above and had no further questions upon leaving the office. They have my  contact information in the event they have any further questions.  I spent 3 minutes counseling on smoking cessation and the health risks of continued tobacco abuse.  I explained to the patient that there has been a high incidence of coronary artery disease noted on these exams. I explained that this is a non-gated exam therefore degree or severity cannot be determined. This patient is on statin therapy. I have asked the patient to follow-up with their PCP regarding any incidental finding of coronary artery disease and management with diet or medication as their PCP  feels is clinically indicated. The patient verbalized understanding of the above and had no further questions upon completion of the visit.      Magdalen Spatz, NP 06/24/2017 3:31 PM

## 2017-06-26 ENCOUNTER — Ambulatory Visit (INDEPENDENT_AMBULATORY_CARE_PROVIDER_SITE_OTHER): Payer: Self-pay

## 2017-06-26 DIAGNOSIS — Z1211 Encounter for screening for malignant neoplasm of colon: Secondary | ICD-10-CM

## 2017-06-26 MED ORDER — NA SULFATE-K SULFATE-MG SULF 17.5-3.13-1.6 GM/177ML PO SOLN: 1.0000 | ORAL | 0 refills | Status: DC

## 2017-06-26 NOTE — Patient Instructions (Signed)
Shannon Price  04-19-1960 MRN: [id number]     Procedure Date: 08/30/17 Time to register: 1:00 Place to register: [facility] Procedure Time: 2:00 Scheduled provider: Barney Drain, MD    PREPARATION FOR COLONOSCOPY WITH SUPREP BOWEL PREP KIT  Note: Suprep Bowel Prep Kit is a split-dose (2day) regimen. Consumption of BOTH 6-ounce bottles is required for a complete prep.  Please notify us immediately if you are diabetic, take iron supplements, or if you are on Coumadin or any other blood thinners.  Please hold the following medications: I will mail you a letter with this information                                                                                                                                                  1 DAY BEFORE PROCEDURE:  DATE: 08/29/17   DAY: Thursday  clear liquids the entire day - NO SOLID FOOD.   Diabetic medications adjustments for today: see letter   At 6:00pm: Complete steps 1 through 4 below, using ONE (1) 6-ounce bottle, before going to bed. Step 1:  Pour ONE (1) 6-ounce bottle of SUPREP liquid into the mixing container.  Step 2:  Add cool drinking water to the 16 ounce line on the container and mix.  Note: Dilute the solution concentrate as directed prior to use. Step 3:  DRINK ALL the liquid in the container. Step 4:  You MUST drink an additional two (2) or more 16 ounce containers of water  over the next one (1) hour.   Continue clear liquids only, until midnight. Do not eat or drink anything after midnight. EXCEPTION: If you take medications for your heart, blood pressure, or breathing, you may take these medications with a small amount of clear liquid.   DAY OF PROCEDURE:   DATE: 08/30/17  DAY: Friday  Diabetic medications adjustments for today:see letter  5 hours before your procedure at 9:00am: Step 1:  Pour ONE (1) 6-ounce bottle of SUPREP liquid into the mixing container.  Step 2:  Add cool drinking water to the 16 ounce line  on the container and mix.  Note: Dilute the solution concentrate as directed prior to use. Step 3:  DRINK ALL the liquid in the container. Step 4:  You MUST drink an additional two (2) or more 16 ounce containers of water  over the next one (1) hour. You MUST complete the final glass of water at least  3 hours before your colonoscopy.   Nothing by mouth past 11:00am  You may take your morning medications with sip of water unless we have instructed otherwise.    Please see below for Dietary Information.  CLEAR LIQUIDS INCLUDE:  Water Jello (NOT red in color)   Ice Popsicles (NOT red in color)   Tea (sugar ok, no milk/cream) Powdered fruit flavored drinks  Coffee (sugar ok, no milk/cream) Gatorade/ Lemonade/ Kool-Aid  (NOT red in color)   Juice: apple, white grape, white cranberry Soft drinks  Clear bullion, consomme, broth (fat free beef/chicken/vegetable)  Carbonated beverages (any kind)  Strained chicken noodle soup Hard Candy   Remember: Clear liquids are liquids that will allow you to see your fingers on the other side of a clear glass. Be sure liquids are NOT red in color, and not cloudy, but CLEAR.  DO NOT EAT OR DRINK ANY OF THE FOLLOWING:  Dairy products of any kind   Cranberry juice Tomato juice / V8 juice   Grapefruit juice Orange juice     Red grape juice  Do not eat any solid foods, including such foods as: cereal, oatmeal, yogurt, fruits, vegetables, creamed soups, eggs, bread, crackers, pureed foods in a blender, etc.   HELPFUL HINTS FOR DRINKING PREP SOLUTION:   Make sure prep is extremely cold. Mix and refrigerate the the morning of the prep. You may also put in the freezer.   You may try mixing some Crystal Light or Country Time Lemonade if you prefer. Mix in small amounts; add more if necessary.  Try drinking through a straw  Rinse mouth with water or a mouthwash between glasses, to remove after-taste.  Try sipping on a cold beverage /ice/ popsicles between  glasses of prep.  Place a piece of sugar-free hard candy in mouth between glasses.  If you become nauseated, try consuming smaller amounts, or stretch out the time between glasses. Stop for 30-60 minutes, then slowly start back drinking.     OTHER INSTRUCTIONS  You will need a responsible adult at least 55 years of age to accompany you and drive you home. This person must remain in the waiting room during your procedure. The hospital will cancel your procedure if you do not have a responsible adult with you.   1. Wear loose fitting clothing that is easily removed. 2. Leave jewelry and other valuables at home.  3. Remove all body piercing jewelry and leave at home. 4. Total time from sign-in until discharge is approximately 2-3 hours. 5. You should go home directly after your procedure and rest. You can resume normal activities the day after your procedure. 6. The day of your procedure you should not:  Drive  Make legal decisions  Operate machinery  Drink alcohol  Return to work   You may call the office (Dept: [phone]) before 5:00pm, or page the doctor on call ([phone]) after 5:00pm, for further instructions, if necessary.   Insurance Information YOU WILL NEED TO CHECK WITH YOUR INSURANCE COMPANY FOR THE BENEFITS OF COVERAGE YOU HAVE FOR THIS PROCEDURE.  UNFORTUNATELY, NOT ALL INSURANCE COMPANIES HAVE BENEFITS TO COVER ALL OR PART OF THESE TYPES OF PROCEDURES.  IT IS YOUR RESPONSIBILITY TO CHECK YOUR BENEFITS, HOWEVER, WE WILL BE GLAD TO ASSIST YOU WITH ANY CODES YOUR INSURANCE COMPANY MAY NEED.    PLEASE NOTE THAT MOST INSURANCE COMPANIES WILL NOT COVER A SCREENING COLONOSCOPY FOR PEOPLE UNDER THE AGE OF 50  IF YOU HAVE BCBS INSURANCE, YOU MAY HAVE BENEFITS FOR A SCREENING COLONOSCOPY BUT IF POLYPS ARE FOUND THE DIAGNOSIS WILL CHANGE AND THEN YOU MAY HAVE A DEDUCTIBLE THAT WILL NEED TO BE MET. SO PLEASE MAKE SURE YOU CHECK YOUR BENEFITS FOR A SCREENING COLONOSCOPY AS  WELL AS A DIAGNOSTIC COLONOSCOPY.      

## 2017-06-26 NOTE — Progress Notes (Signed)
Gastroenterology Pre-Procedure Review  Request Date:06/26/17 Requesting Physician: Wilfred Curtis (no previous tcs)  PATIENT REVIEW QUESTIONS: The patient responded to the following health history questions as indicated:    1. Diabetes Melitis: yes (glipizide and metformin) 2. Joint replacements in the past 12 months: no 3. Major health problems in the past 3 months: no 4. Has an artificial valve or MVP: no 5. Has a defibrillator: no 6. Has been advised in past to take antibiotics in advance of a procedure like teeth cleaning: no 7. Family history of colon cancer: no  8. Alcohol Use: no 9. History of sleep apnea: no  10. History of coronary artery or other vascular stents placed within the last 12 months: no 11. History of any prior anesthesia complications: no    MEDICATIONS & ALLERGIES:    Patient reports the following regarding taking any blood thinners:   Plavix? no Aspirin? yes (32m) Coumadin? no Brilinta? no Xarelto? no Eliquis? no Pradaxa? no Savaysa? no Effient? no  Patient confirms/reports the following medications:  Current Outpatient Medications  Medication Sig Dispense Refill  . aspirin 325 MG tablet Take 1 tablet (325 mg total) by mouth daily. 30 tablet 5  . Aspirin-Salicylamide-Caffeine (BC HEADACHE POWDER PO) Take 1 packet by mouth daily as needed.    .Marland Kitchenatorvastatin (LIPITOR) 20 MG tablet Take 1 tablet (20 mg total) by mouth daily. 30 tablet 5  . escitalopram (LEXAPRO) 20 MG tablet Take 1 tablet (20 mg total) by mouth daily. 30 tablet 5  . glipiZIDE (GLUCOTROL) 5 MG tablet Take 1 tablet (5 mg total) by mouth 2 (two) times daily before a meal. 60 tablet 5  . lisinopril-hydrochlorothiazide (ZESTORETIC) 20-12.5 MG tablet Take 1 tablet by mouth daily. 90 tablet 0  . metFORMIN (GLUCOPHAGE) 1000 MG tablet Take 1 tablet (1,000 mg total) by mouth 2 (two) times daily. 180 tablet 1  . verapamil (CALAN-SR) 240 MG CR tablet TAKE 1 TABLET BY MOUTH IN THE MORNING 30 tablet  5  . blood glucose meter kit and supplies KIT Dispense based on patient and insurance preference. Check blood sugar once daily. Diabetes type 2. Diagnosis code E11.9 1 each 5  . ONETOUCH DELICA LANCETS 364BMISC USE ONE LANCET TO CHECK GLUCOSE ONCE DAILY IN THE MORNING 100 each 1   No current facility-administered medications for this visit.     Patient confirms/reports the following allergies:  No Known Allergies  No orders of the defined types were placed in this encounter.   AUTHORIZATION INFORMATION Primary Insurance: hJosephine Igo ID #: [id number]-Cert / AJosem Kaufmannrequired: no   SCHEDULE INFORMATION: Procedure has been scheduled as follows:  Date:08/30/17 , Time:2:00 Location: [facility]  This Gastroenterology Pre-Precedure Review Form is being routed to the following provider(s): [facility]

## 2017-06-27 ENCOUNTER — Other Ambulatory Visit: Payer: Self-pay | Admitting: Acute Care

## 2017-06-27 DIAGNOSIS — F17210 Nicotine dependence, cigarettes, uncomplicated: Principal | ICD-10-CM

## 2017-06-27 DIAGNOSIS — Z122 Encounter for screening for malignant neoplasm of respiratory organs: Secondary | ICD-10-CM

## 2017-07-02 ENCOUNTER — Other Ambulatory Visit: Payer: Self-pay | Admitting: Family Medicine

## 2017-07-05 NOTE — Progress Notes (Signed)
OK to schedule.  Day of prep: 1/2 dose metformin and 1/2 dose glipizide (if she is taking it) AM of prep: hold metformin and glipizide

## 2017-07-08 ENCOUNTER — Telehealth: Payer: Self-pay | Admitting: Family Medicine

## 2017-07-08 ENCOUNTER — Other Ambulatory Visit: Payer: Self-pay | Admitting: *Deleted

## 2017-07-08 MED ORDER — VERAPAMIL HCL ER 240 MG PO TBCR: 240.0000 mg | EXTENDED_RELEASE_TABLET | Freq: Every morning | ORAL | 0 refills | Status: DC

## 2017-07-08 NOTE — Telephone Encounter (Signed)
Refills sent per dr steve. Pt notified.  

## 2017-07-08 NOTE — Telephone Encounter (Signed)
Patient said that she is out of her Verapamil and her pharmacy has not had this in stock for the past 3 weeks.  She is requesting new Rx sent to [facility] on [address].

## 2017-07-08 NOTE — Progress Notes (Signed)
Letter mailed to the pt to adjust DM meds.

## 2017-07-12 IMAGING — RF DG CERVICAL SPINE 2 OR 3 VIEWS
1 series · 2 of 2 positions shown · non-contrast
Comparison: None.

CLINICAL DATA: Portable operative imaging of the cervical spine for
anterior cervical disc fusion. Fluoroscopy time 5 seconds.

EXAM:
CERVICAL SPINE - 2-3 VIEW; DG C-ARM 61-120 MIN

[Series 1: run · 2 of 2 slices shown]
[im 1/2]
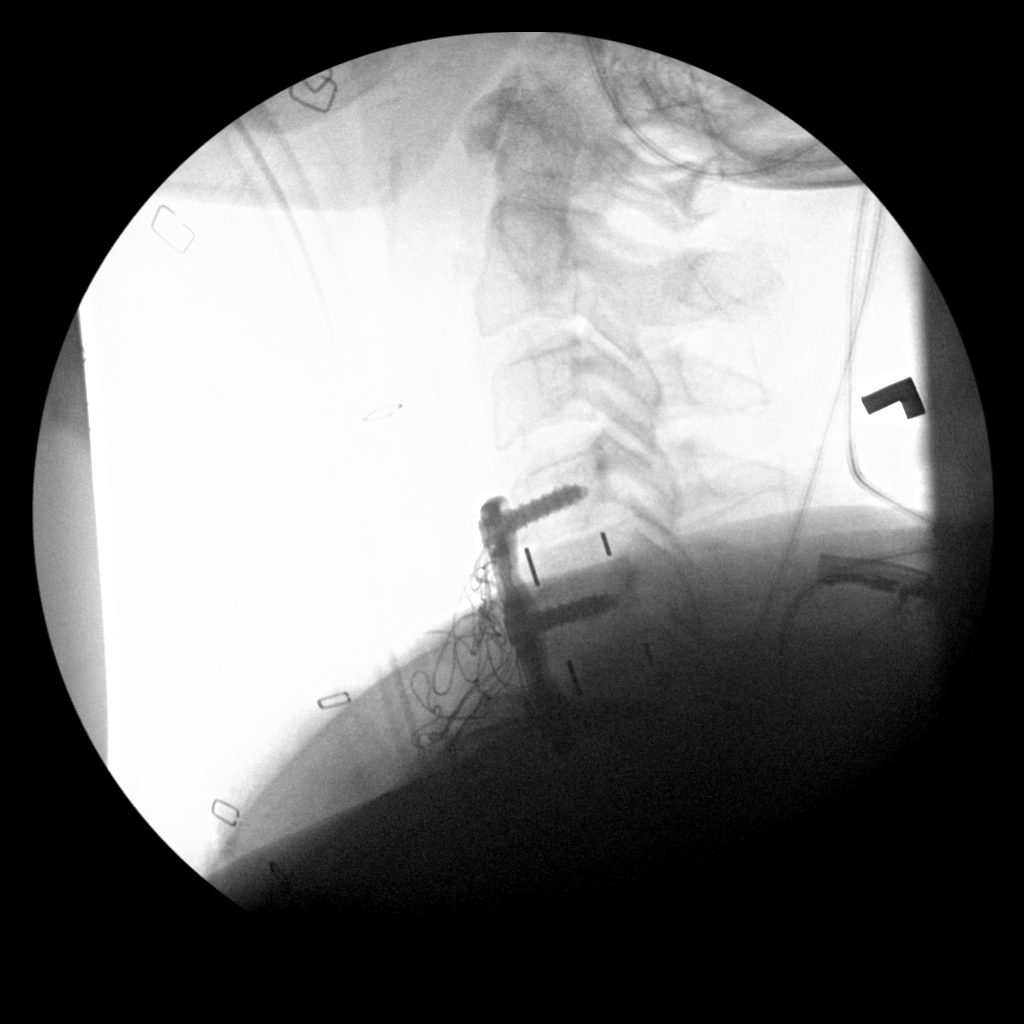
[im 2/2]
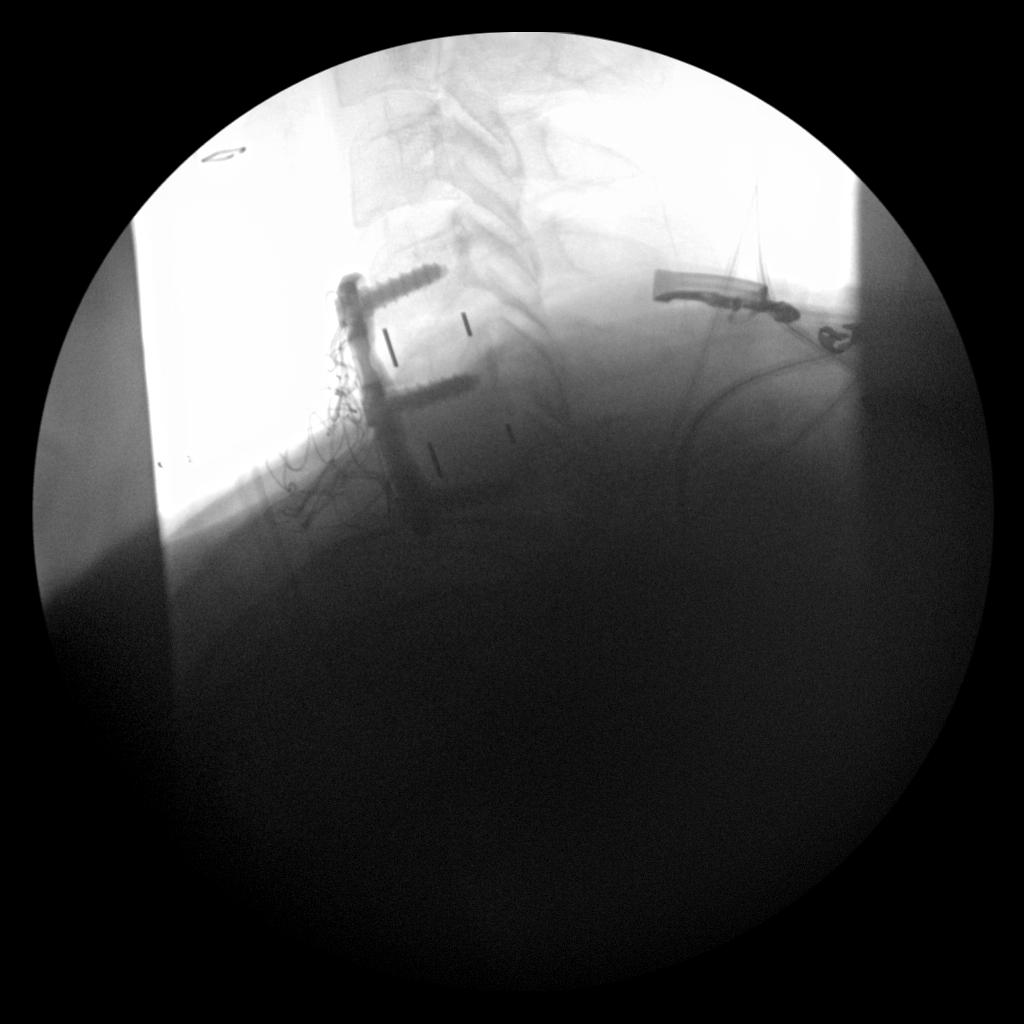

[2 of 2 positions shown; findings below may reference images not displayed]

FINDINGS: The 2 submitted images show placement of a anterior fixation plate
and fixation screws spanning C4-C6. Radiolucent disc spacers
maintaining disc height diffuse levels. The orthopedic hardware is
well-seated and well-aligned.
IMPRESSION: Well-positioned orthopedic hardware following anterior cervical disc
fusion at C4-C5 and C5-C6.

## 2017-07-13 ENCOUNTER — Other Ambulatory Visit: Payer: Self-pay | Admitting: Family Medicine

## 2017-07-15 ENCOUNTER — Other Ambulatory Visit: Payer: Self-pay | Admitting: *Deleted

## 2017-07-15 ENCOUNTER — Other Ambulatory Visit: Payer: Self-pay | Admitting: Pharmacist

## 2017-07-15 ENCOUNTER — Telehealth: Payer: Self-pay | Admitting: Family Medicine

## 2017-07-15 DIAGNOSIS — I63312 Cerebral infarction due to thrombosis of left middle cerebral artery: Secondary | ICD-10-CM

## 2017-07-15 MED ORDER — ATORVASTATIN CALCIUM 20 MG PO TABS: 20.0000 mg | ORAL_TABLET | Freq: Every day | ORAL | 0 refills | Status: DC

## 2017-07-15 MED ORDER — LISINOPRIL-HYDROCHLOROTHIAZIDE 20-12.5 MG PO TABS: 1.0000 | ORAL_TABLET | Freq: Every day | ORAL | 0 refills | Status: DC

## 2017-07-15 NOTE — Patient Outreach (Signed)
[facility]) Care Management  07/15/2017  Shannon [name] Jan 01, 1961 [id number]   Incoming call from Jacques Navy in response to the [facility] Medication Adherence Campaign. Speak with patient. HIPAA identifiers verified and verbal consent received.  [name] reports that she takes her lisinopril-hydrochlorothiazide and atorvastatin each once daily as directed. Denies any missed doses. However, reports that she is currently in need of a refill of her lisinopril-hydrochlorothiazide. States that over the weekend she requested that her pharmacy fax her doctor's office for a refill. Reports that she has not had her atorvastatin refilled recently because she and her husband are on the same strength and reports that they share their medication. Advise patient to discontinue this "sharing of medication". Counsel patient about the potential risks. Encourage patient to get her own medication refilled and put on automatic refill. [name] reports that this would be helpful and that she will call [facility] to set this up. Note that patient last had her atorvastatin refilled for a 90 day supply, but that her PCP most recently sent in a new prescription for a 30 day supply to her pharmacy.  [name] denies any further medication questions/concerns at this time.   PLAN  1) Will call patient's PCP office to request that 90 day supply prescriptions be sent for patient for both her lisinopril-hydrochlorothiazide and atorvastatin to her [place].  2) Will close pharmacy episode.  Harlow Asa, PharmD, [facility] [phone]

## 2017-07-15 NOTE — Telephone Encounter (Signed)
Refills sent to [facility] for 90 day supply. Left message to return call to notify pt.

## 2017-07-15 NOTE — Telephone Encounter (Signed)
Elizabeth (pharmacist with [facility]) called to ask that we order 90 day supply for 2 medicines  lisinopril-hydrochlorothiazide (ZESTORETIC) 20-12.5 MG tablet  atorvastatin (LIPITOR) 20 MG tablet  Please send to [facility] if questions [phone]

## 2017-07-15 NOTE — Patient Outreach (Signed)
[facility]) Care Management  07/15/2017  Shannon [name] [birth year] [id number]  Call patient's PCP office to request that 90 day supply prescriptions be sent for patient for [name] her lisinopril-hydrochlorothiazide and atorvastatin to her [place]. Leave a message with [name] in the office.  Harlow Asa, PharmD, [facility] [phone]

## 2017-08-02 ENCOUNTER — Encounter: Payer: Self-pay | Admitting: Nurse Practitioner

## 2017-08-02 ENCOUNTER — Ambulatory Visit (INDEPENDENT_AMBULATORY_CARE_PROVIDER_SITE_OTHER): Payer: Medicare HMO | Admitting: Nurse Practitioner

## 2017-08-02 VITALS — BP 136/84 | Ht 70.0 in | Wt 249.4 lb

## 2017-08-02 DIAGNOSIS — E119 Type 2 diabetes mellitus without complications: Secondary | ICD-10-CM

## 2017-08-02 DIAGNOSIS — L301 Dyshidrosis [pompholyx]: Secondary | ICD-10-CM

## 2017-08-02 LAB — POCT GLYCOSYLATED HEMOGLOBIN (HGB A1C): Hemoglobin A1C: 5 % (ref 4.0–5.6)

## 2017-08-02 MED ORDER — TRIAMCINOLONE ACETONIDE 0.1 % EX CREA: 1.0000 "application " | TOPICAL_CREAM | Freq: Two times a day (BID) | CUTANEOUS | 0 refills | Status: DC

## 2017-08-02 NOTE — Patient Instructions (Addendum)
Glipizide: decrease to 1/2 tab twice a day; if blood sugars are still low, stop medication   Hand Dermatitis Hand dermatitis is a skin condition that causes small, itchy, raised dots or fluid-filled blisters to form over the palms of the hands. This condition may also be called hand eczema. What are the causes? The cause of this condition is not known. What increases the risk? This condition is more likely to develop in people who have a history of allergies, such as:  Hay fever.  Allergic asthma.  An allergy to latex.  Chemical exposure, injuries, and environmental irritants can make hand dermatitis worse. Washing your hands too often can remove natural oils, which can dry out the skin and contribute to outbreaks of this condition. What are the signs or symptoms? The most common symptom of this condition is intense itchiness. Cracks or grooves (fissures) on the fingers can also develop. Affected areas can be painful, especially areas where large blisters have formed. How is this diagnosed? This condition is diagnosed with a medical history and physical exam. How is this treated? This condition is treated with medicines, including:  Steroid creams and ointments.  Oral steroid medicines.  Antibiotic medicines. These are prescribed if you have an infection.  Antihistamine medicines. These help to reduce itchiness.  Follow these instructions at home:  Take or apply over-the-counter and prescription medicines only as told by your health care provider.  If you were prescribed an antibiotic medicine, use it as told by your health care provider. Do not stop using the antibiotic even if you start to feel better.  Avoid washing your hands more often than necessary.  Avoid using harsh chemicals on your hands.  Wear protective gloves when you handle products that can irritate your skin.  Keep all follow-up visits as told by your health care provider. This is important. Contact a  health care provider if:  Your rash does not improve during the first week of treatment.  Your rash is red or tender.  Your rash has pus coming from it.  Your rash spreads. This information is not intended to replace advice given to you by your health care provider. Make sure you discuss any questions you have with your health care provider. Document Released: 12/25/2004 Document Revised: 06/02/2015 Document Reviewed: 07/09/2014 Elsevier Interactive Patient Education  [employer].

## 2017-08-03 ENCOUNTER — Encounter: Payer: Self-pay | Admitting: Nurse Practitioner

## 2017-08-03 NOTE — Progress Notes (Signed)
Subjective: Presents for recheck on her diabetes.  Continues to work on weight loss.  Has done extremely well.  Has started her her own version of a low-carb diet.  Has experienced hypoglycemia 6 times since starting this, one was significant.  Has a colonoscopy scheduled for next month.  Lexapro working well for her emotional lability, much calmer.  No chest pain/ischemic type pain or shortness of breath.  Complaints of a dry itchy rash mainly on the fingers of both hands that is been going on for a while.  Starts off as clear fluid-filled bumps then intense itching.  No known allergens.  Objective:   BP 136/84   Ht 5\' 10"  (1.778 m)   Wt 249 lb 6.4 oz (113.1 kg)   BMI 35.79 kg/m  NAD.  Alert, oriented.  Thoughts logical coherent and relevant.  Dressed appropriately.  Making good eye contact.  Cheerful affect.  Lungs clear.  Heart regular rate and rhythm.  A few scattered dry papules on the fingers with some excoriation. Results for orders placed or performed in visit on 08/02/17  POCT HgB A1C  Result Value Ref Range   Hemoglobin A1C 5.0 4.0 - 5.6 %   HbA1c POC (<> result, manual entry)  4.0 - 5.6 %   HbA1c, POC (prediabetic range)  5.7 - 6.4 %   HbA1c, POC (controlled diabetic range)  0.0 - 7.0 %   Has lost 20 pounds since January.  Assessment:   Problem List Items Addressed This Visit      Endocrine   Diabetes mellitus without complication ([place]) - Primary   Relevant Orders   POCT HgB A1C (Completed)    Other Visit Diagnoses    Dyshidrotic eczema           Plan:   Meds ordered this encounter  Medications  . triamcinolone cream (KENALOG) 0.1 %    Sig: Apply 1 application topically 2 (two) times daily. Prn rash; use up to 2 weeks    Dispense:  30 g    Refill:  0    Order Specific Question:   Supervising Provider    Answer:   Mikey Kirschner [[id number]]   Reviewed measures to help prevent dyshidrotic eczema.  Call back if persists.   Decrease glipizide to half a tab twice daily  monitor blood sugars and discontinue medication if any further hypoglycemic episodes.  Continue measures for healthy diet and weight loss.  Encouraged regular activity. Return in about 3 months (around 11/02/2017) for diabetes check up.

## 2017-08-30 ENCOUNTER — Ambulatory Visit (HOSPITAL_COMMUNITY)
Admission: RE | Admit: 2017-08-30 | Discharge: 2017-08-30 | Disposition: A | Discharge status: Home / Self Care | Payer: Medicare HMO | Source: Ambulatory Visit | Attending: Gastroenterology | Admitting: Gastroenterology

## 2017-08-30 ENCOUNTER — Other Ambulatory Visit: Payer: Self-pay

## 2017-08-30 ENCOUNTER — Encounter (HOSPITAL_COMMUNITY)
Admission: RE | Disposition: A | Discharge status: Home / Self Care | Payer: Self-pay | Source: Ambulatory Visit | Attending: Gastroenterology

## 2017-08-30 ENCOUNTER — Encounter (HOSPITAL_COMMUNITY): Payer: Self-pay | Admitting: *Deleted

## 2017-08-30 DIAGNOSIS — D123 Benign neoplasm of transverse colon: Secondary | ICD-10-CM | POA: Diagnosis not present

## 2017-08-30 DIAGNOSIS — E119 Type 2 diabetes mellitus without complications: Secondary | ICD-10-CM | POA: Insufficient documentation

## 2017-08-30 DIAGNOSIS — K621 Rectal polyp: Secondary | ICD-10-CM

## 2017-08-30 DIAGNOSIS — D124 Benign neoplasm of descending colon: Secondary | ICD-10-CM | POA: Diagnosis not present

## 2017-08-30 DIAGNOSIS — Z7982 Long term (current) use of aspirin: Secondary | ICD-10-CM | POA: Insufficient documentation

## 2017-08-30 DIAGNOSIS — E7800 Pure hypercholesterolemia, unspecified: Secondary | ICD-10-CM | POA: Diagnosis not present

## 2017-08-30 DIAGNOSIS — Z79899 Other long term (current) drug therapy: Secondary | ICD-10-CM | POA: Diagnosis not present

## 2017-08-30 DIAGNOSIS — F17210 Nicotine dependence, cigarettes, uncomplicated: Secondary | ICD-10-CM | POA: Diagnosis not present

## 2017-08-30 DIAGNOSIS — Z981 Arthrodesis status: Secondary | ICD-10-CM | POA: Insufficient documentation

## 2017-08-30 DIAGNOSIS — Z8673 Personal history of transient ischemic attack (TIA), and cerebral infarction without residual deficits: Secondary | ICD-10-CM | POA: Diagnosis not present

## 2017-08-30 DIAGNOSIS — Z7984 Long term (current) use of oral hypoglycemic drugs: Secondary | ICD-10-CM | POA: Insufficient documentation

## 2017-08-30 DIAGNOSIS — Q438 Other specified congenital malformations of intestine: Secondary | ICD-10-CM | POA: Insufficient documentation

## 2017-08-30 DIAGNOSIS — K648 Other hemorrhoids: Secondary | ICD-10-CM | POA: Diagnosis not present

## 2017-08-30 DIAGNOSIS — F419 Anxiety disorder, unspecified: Secondary | ICD-10-CM | POA: Diagnosis not present

## 2017-08-30 DIAGNOSIS — D128 Benign neoplasm of rectum: Secondary | ICD-10-CM | POA: Insufficient documentation

## 2017-08-30 DIAGNOSIS — I10 Essential (primary) hypertension: Secondary | ICD-10-CM | POA: Diagnosis not present

## 2017-08-30 DIAGNOSIS — Z1211 Encounter for screening for malignant neoplasm of colon: Secondary | ICD-10-CM | POA: Diagnosis not present

## 2017-08-30 HISTORY — PX: COLONOSCOPY: SHX5424

## 2017-08-30 HISTORY — PX: POLYPECTOMY: SHX5525

## 2017-08-30 LAB — GLUCOSE, CAPILLARY: Glucose-Capillary: 112 mg/dL — ABNORMAL HIGH (ref 70–99)

## 2017-08-30 SURGERY — COLONOSCOPY
Anesthesia: Moderate Sedation

## 2017-08-30 MED ORDER — MIDAZOLAM HCL 5 MG/5ML IJ SOLN
INTRAMUSCULAR | Status: DC | PRN
  Administered 2017-08-30 (×3): 2 mg via INTRAVENOUS

## 2017-08-30 MED ORDER — STERILE WATER FOR IRRIGATION IR SOLN
Status: DC | PRN
  Administered 2017-08-30: 15:00:00

## 2017-08-30 MED ORDER — MIDAZOLAM HCL 5 MG/5ML IJ SOLN
INTRAMUSCULAR | Status: AC
  Filled 2017-08-30: qty 10

## 2017-08-30 MED ORDER — MEPERIDINE HCL 100 MG/ML IJ SOLN
INTRAMUSCULAR | Status: AC
  Filled 2017-08-30: qty 2

## 2017-08-30 MED ORDER — SODIUM CHLORIDE 0.9 % IV SOLN
INTRAVENOUS | Status: DC
  Administered 2017-08-30: 14:00:00 via INTRAVENOUS

## 2017-08-30 MED ORDER — MEPERIDINE HCL 100 MG/ML IJ SOLN
INTRAMUSCULAR | Status: DC | PRN
  Administered 2017-08-30 (×2): 25 mg via INTRAVENOUS
  Administered 2017-08-30: 50 mg via INTRAVENOUS

## 2017-08-30 NOTE — Discharge Instructions (Signed)
You had 5 polyps removed. You have SMALL internal hemorrhoids.   DRINK WATER TO KEEP YOUR URINE LIGHT YELLOW.  CONTINUE YOUR WEIGHT LOSS EFFORTS. YOUR BODY MASS INDEX IS OVER 30 WHICH MEANS YOU ARE OBESE. OBESITY IS ASSOCIATED WITH AN INCREASED FOR CIRRHOSIS AND ALL CANCERS, INCLUDING ESOPHAGEAL AND COLON CANCER. A WEIGHT OF 205 LBS OR LESS  WILL GET YOUR BODY MASS INDEX(BMI) UNDER 30.  FOLLOW A HIGH FIBER DIET. AVOID ITEMS THAT CAUSE BLOATING & GAS. SEE INFO BELOW.  YOUR BIOPSY RESULTS WILL BE AVAILABLE IN 7 days.    Next colonoscopy in 3 years.    Colonoscopy Care After Read the instructions outlined below and refer to this sheet in the next week. These discharge instructions provide you with general information on caring for yourself after you leave the hospital. While your treatment has been planned according to the most current medical practices available, unavoidable complications occasionally occur. If you have any problems or questions after discharge, call DR. FIELDS, [phone].  ACTIVITY  You may resume your regular activity, but move at a slower pace for the next 24 hours.   Take frequent rest periods for the next 24 hours.   Walking will help get rid of the air and reduce the bloated feeling in your belly (abdomen).   No driving for 24 hours (because of the medicine (anesthesia) used during the test).   You may shower.   Do not sign any important legal documents or operate any machinery for 24 hours (because of the anesthesia used during the test).    NUTRITION  Drink plenty of fluids.   You may resume your normal diet as instructed by your doctor.   Begin with a light meal and progress to your normal diet. Heavy or fried foods are harder to digest and may make you feel sick to your stomach (nauseated).   Avoid alcoholic beverages for 24 hours or as instructed.    MEDICATIONS  You may resume your normal medications.   WHAT YOU CAN EXPECT TODAY  Some  feelings of bloating in the abdomen.   Passage of more gas than usual.   Spotting of blood in your stool or on the toilet paper  .  IF YOU HAD POLYPS REMOVED DURING THE COLONOSCOPY:  Eat a soft diet IF YOU HAVE NAUSEA, BLOATING, ABDOMINAL PAIN, OR VOMITING.    FINDING OUT THE RESULTS OF YOUR TEST Not all test results are available during your visit. DR. Oneida Alar WILL CALL YOU WITHIN 14 DAYS OF YOUR PROCEDUE WITH YOUR RESULTS. Do not assume everything is normal if you have not heard from DR. FIELDS, CALL HER OFFICE AT [phone].  SEEK IMMEDIATE MEDICAL ATTENTION AND CALL THE OFFICE: [phone] IF:  You have more than a spotting of blood in your stool.   Your belly is swollen (abdominal distention).   You are nauseated or vomiting.   You have a temperature over 101F.   You have abdominal pain or discomfort that is severe or gets worse throughout the day.   High-Fiber Diet A high-fiber diet changes your normal diet to include more whole grains, legumes, fruits, and vegetables. Changes in the diet involve replacing refined carbohydrates with unrefined foods. The calorie level of the diet is essentially unchanged. The Dietary Reference Intake (recommended amount) for adult males is 38 grams per day. For adult females, it is 25 grams per day. Pregnant and lactating women should consume 28 grams of fiber per day. Fiber is the intact part of  a plant that is not broken down during digestion. Functional fiber is fiber that has been isolated from the plant to provide a beneficial effect in the body.  PURPOSE  Increase stool bulk.   Ease and regulate bowel movements.   Lower cholesterol.   REDUCE RISK OF COLON CANCER  INDICATIONS THAT YOU NEED MORE FIBER  Constipation and hemorrhoids.   Uncomplicated diverticulosis (intestine condition) and irritable bowel syndrome.   Weight management.   As a protective measure against hardening of the arteries (atherosclerosis), diabetes,  and cancer.   GUIDELINES FOR INCREASING FIBER IN THE DIET  Start adding fiber to the diet slowly. A gradual increase of about 5 more grams (2 slices of whole-wheat bread, 2 servings of most fruits or vegetables, or 1 bowl of high-fiber cereal) per day is best. Too rapid an increase in fiber may result in constipation, flatulence, and bloating.   Drink enough water and fluids to keep your urine clear or pale yellow. Water, juice, or caffeine-free drinks are recommended. Not drinking enough fluid may cause constipation.   Eat a variety of high-fiber foods rather than one type of fiber.   Try to increase your intake of fiber through using high-fiber foods rather than fiber pills or supplements that contain small amounts of fiber.   The goal is to change the types of food eaten. Do not supplement your present diet with high-fiber foods, but replace foods in your present diet.   INCLUDE A VARIETY OF FIBER SOURCES  Replace refined and processed grains with whole grains, canned fruits with fresh fruits, and incorporate other fiber sources. White rice, white breads, and most bakery goods contain little or no fiber.   Brown whole-grain rice, buckwheat oats, and many fruits and vegetables are all good sources of fiber. These include: broccoli, Brussels sprouts, cabbage, cauliflower, beets, sweet potatoes, white potatoes (skin on), carrots, tomatoes, eggplant, squash, berries, fresh fruits, and dried fruits.   Cereals appear to be the richest source of fiber. Cereal fiber is found in whole grains and bran. Bran is the fiber-rich outer coat of cereal grain, which is largely removed in refining. In whole-grain cereals, the bran remains. In breakfast cereals, the largest amount of fiber is found in those with "bran" in their names. The fiber content is sometimes indicated on the label.   You may need to include additional fruits and vegetables each day.   In baking, for 1 cup white flour, you may use the  following substitutions:   1 cup whole-wheat flour minus 2 tablespoons.   1/2 cup white flour plus 1/2 cup whole-wheat flour.   Polyps, Colon  A polyp is extra tissue that grows inside your body. Colon polyps grow in the large intestine. The large intestine, also called the colon, is part of your digestive system. It is a long, hollow tube at the end of your digestive tract where your body makes and stores stool. Most polyps are not dangerous. They are benign. This means they are not cancerous. But over time, some types of polyps can turn into cancer. Polyps that are smaller than a pea are usually not harmful. But larger polyps could someday become or may already be cancerous. To be safe, doctors remove all polyps and test them.   WHO GETS POLYPS? Anyone can get polyps, but certain people are more likely than others. You may have a greater chance of getting polyps if:  You are over 50.   You have had polyps before.  Someone in your family has had polyps.   Someone in your family has had cancer of the large intestine.   Find out if someone in your family has had polyps. You may also be more likely to get polyps if you:   Eat a lot of fatty foods   Smoke   Drink alcohol   Do not exercise  Eat too much   PREVENTION There is not one sure way to prevent polyps. You might be able to lower your risk of getting them if you:  Eat more fruits and vegetables and less fatty food.   Do not smoke.   Avoid alcohol.   Exercise every day.   Lose weight if you are overweight.   Eating more calcium and folate can also lower your risk of getting polyps. Some foods that are rich in calcium are milk, cheese, and broccoli. Some foods that are rich in folate are chickpeas, kidney beans, and spinach.   Hemorrhoids Hemorrhoids are dilated (enlarged) veins around the rectum. Sometimes clots will form in the veins. This makes them swollen and painful. These are called thrombosed hemorrhoids. Causes  of hemorrhoids include:  Constipation.   Straining to have a bowel movement.   HEAVY LIFTING  HOME CARE INSTRUCTIONS  Eat a well balanced diet and drink 6 to 8 glasses of water every day to avoid constipation. You may also use a bulk laxative.   Avoid straining to have bowel movements.   Keep anal area dry and clean.   Do not use a donut shaped pillow or sit on the toilet for long periods. This increases blood pooling and pain.   Move your bowels when your body has the urge; this will require less straining and will decrease pain and pressure.

## 2017-08-30 NOTE — H&P (Signed)
Primary Care Physician:  Mikey Kirschner, MD Primary Gastroenterologist:  Dr. Oneida Alar  Pre-Procedure History & Physical: HPI:  Shannon Price is a 57 y.o. female here for [place].  Past Medical History:  Diagnosis Date  . Anxiety   . CVA (cerebral vascular accident) ([place]) 07/2013   "just affected my speech"  . IEPPIRJJ(884.1)    "weekly" (07/21/2013)  . High cholesterol   . Hypertension   . Type II diabetes mellitus ([place])     Past Surgical History:  Procedure Laterality Date  . ANTERIOR CERVICAL DECOMP/DISCECTOMY FUSION N/A 08/09/2015   Procedure: Cervical Four- Five, Cervical Five-Six  Anterior cervical decompression/diskectomy/fusion/plate fixation;  Surgeon: Kevan Ny Ditty, MD;  Location: [facility];  Service: Neurosurgery;  Laterality: N/A;  right side approach  . CESAREAN SECTION  1982    Prior to Admission medications   Medication Sig Start Date End Date Taking? Authorizing Provider  aspirin 325 MG tablet Take 1 tablet (325 mg total) by mouth daily. 10/13/14  Yes Rosalin Hawking, MD  Aspirin-Salicylamide-Caffeine [facility] HEADACHE POWDER PO) Take 2 packets by mouth daily as needed (for pain or headache).    Yes [provider]  atorvastatin (LIPITOR) 20 MG tablet Take 1 tablet (20 mg total) by mouth daily. Patient taking differently: Take 20 mg by mouth every evening.  07/15/17  Yes Mikey Kirschner, MD  escitalopram (LEXAPRO) 20 MG tablet Take 1 tablet (20 mg total) by mouth daily. Patient taking differently: Take 20 mg by mouth every evening.  06/13/17  Yes Mikey Kirschner, MD  glipiZIDE (GLUCOTROL) 5 MG tablet TAKE 1 TABLET BY MOUTH TWICE DAILY BEFORE A MEAL Patient taking differently: Take 2.5 mg by mouth 2 (two) times daily before a meal.  07/02/17  Yes Mikey Kirschner, MD  lisinopril-hydrochlorothiazide (PRINZIDE,ZESTORETIC) 20-12.5 MG tablet Take 1 tablet by mouth daily. 07/15/17  Yes Mikey Kirschner, MD  metFORMIN (GLUCOPHAGE) 1000 MG tablet Take  1 tablet (1,000 mg total) by mouth 2 (two) times daily. 04/23/17  Yes Mikey Kirschner, MD  Na Sulfate-K Sulfate-Mg Sulf (SUPREP BOWEL PREP KIT) 17.5-3.13-1.6 GM/177ML SOLN Take 1 kit by mouth as directed. 06/26/17  Yes Annitta Needs, NP  triamcinolone cream (KENALOG) 0.1 % Apply 1 application topically 2 (two) times daily. Prn rash; use up to 2 weeks Patient taking differently: Apply 1 application topically daily as needed (for rash).  08/02/17  Yes Nilda Simmer, NP  verapamil (CALAN-SR) 240 MG CR tablet Take 1 tablet (240 mg total) by mouth every morning. 07/08/17  Yes Mikey Kirschner, MD  blood glucose meter kit and supplies KIT Dispense based on patient and insurance preference. Check blood sugar once daily. Diabetes type 2. Diagnosis code E11.9 Patient not taking: Reported on 08/27/2017 08/15/15   Mikey Kirschner, MD  [place] LANCETS 66A MISC USE ONE LANCET TO CHECK GLUCOSE ONCE DAILY IN THE MORNING Patient not taking: Reported on 08/27/2017 08/15/15   Mikey Kirschner, MD    Allergies as of 06/26/2017  . (No Known Allergies)    Family History  Problem Relation Age of Onset  . Hypertension Mother   . Hypertension Father     Social History   Socioeconomic History  . Marital status: Married    Spouse name: Not on file  . Number of children: 2  . Years of education: 10th  . Highest education level: Not on file  Occupational History  . Occupation: N/A  Social Needs  .  Financial resource strain: Not on file  . Food insecurity:    Worry: Not on file    Inability: Not on file  . Transportation needs:    Medical: Not on file    Non-medical: Not on file  Tobacco Use  . Smoking status: Current Every Day Smoker    Packs/day: 2.00    Years: 43.00    Pack years: 86.00    Types: Cigarettes  . Smokeless tobacco: Never Used  Substance and Sexual Activity  . Alcohol use: Yes    Comment: occ, 05-09-2016 per pt occa.  . Drug use: No    Comment: 05-09-2016 per pt no  . Sexual  activity: Yes    Birth control/protection: Post-menopausal  Lifestyle  . Physical activity:    Days per week: Not on file    Minutes per session: Not on file  . Stress: Not on file  Relationships  . Social connections:    Talks on phone: Not on file    Gets together: Not on file    Attends religious service: Not on file    Active member of club or organization: Not on file    Attends meetings of clubs or organizations: Not on file    Relationship status: Not on file  . Intimate partner violence:    Fear of current or ex partner: Not on file    Emotionally abused: Not on file    Physically abused: Not on file    Forced sexual activity: Not on file  Other Topics Concern  . Not on file  Social History Narrative   Patient is married with 2 children.   Patient is right handed.   Patient has 10 th grade education.   Patient drinks 5 or more daily.    Review of Systems: See HPI, otherwise negative ROS   Physical Exam: BP (!) 143/73   Pulse 87   Temp 98.9 F (37.2 C) (Oral)   Resp 19   Ht 5' 5.5" (1.664 m)   SpO2 97%   BMI 40.87 kg/m  General:   Alert,  pleasant and cooperative in NAD Head:  Normocephalic and atraumatic. Neck:  Supple; Lungs:  Clear throughout to auscultation.    Heart:  Regular rate and rhythm. Abdomen:  Soft, nontender and nondistended. Normal bowel sounds, without guarding, and without rebound.   Neurologic:  Alert and  oriented x4;  grossly normal neurologically.  Impression/Plan:    SCREENING  Plan:  1. TCS TODAY DISCUSSED PROCEDURE, BENEFITS, & RISKS: < 1% chance of medication reaction, bleeding, perforation, or rupture of spleen/liver.

## 2017-08-30 NOTE — Op Note (Signed)
[facility] Patient Name: Shannon Price Procedure Date: 08/30/2017 2:55 PM MRN: [id number] Date of Birth: November 15, 1960 Attending MD: Barney Drain MD, MD CSN: [id number] Age: 57 Admit Type: Outpatient Procedure:                Colonoscopy WITH SNARE CAUTERY POLYEPCTOMY Indications:              Screening for colorectal malignant neoplasm Providers:                Barney Drain MD, MD, Hinton Rao, RN, Nelma Rothman,                            Technician Referring MD:             Rosemary Holms, MD Medicines:                Meperidine 100 mg IV, Midazolam 6 mg IV Complications:            No immediate complications. Estimated Blood Loss:     Estimated blood loss: Minimal. Procedure:                Pre-Anesthesia Assessment:                           - Prior to the procedure, a History and Physical                            was performed, and patient medications and                            allergies were reviewed. The patient's tolerance of                            previous anesthesia was also reviewed. The risks                            and benefits of the procedure and the sedation                            options and risks were discussed with the patient.                            All questions were answered, and informed consent                            was obtained. Prior Anticoagulants: The patient has                            taken aspirin, last dose was day of procedure. ASA                            Grade Assessment: II - A patient with mild systemic                            disease. After reviewing the risks and benefits,  the patient was deemed in satisfactory condition to                            undergo the procedure. After obtaining informed                            consent, the colonoscope was passed under direct                            vision. Throughout the procedure, the patient's                            blood  pressure, pulse, and oxygen saturations were                            monitored continuously. The CF-[id number] ([id number])                            scope was introduced through the anus and advanced                            to the the cecum, identified by appendiceal orifice                            and ileocecal valve. The colonoscopy was                            technically difficult and complex due to a tortuous                            colon. Successful completion of the procedure was                            aided by changing the patient's position, using                            manual pressure, straightening and shortening the                            scope to obtain bowel loop reduction and COLOWRAP.                            The patient tolerated the procedure fairly well.                            The quality of the bowel preparation was good. The                            ileocecal valve, appendiceal orifice, and rectum                            were photographed. Scope In: 3:13:32 PM Scope Out: 3:35:15 PM Scope Withdrawal Time: 0 hours 13 minutes 36 seconds  Total  Procedure Duration: 0 hours 21 minutes 43 seconds  Findings:      Five sessile polyps were found in the rectum, descending colon and       hepatic flexure. The polyps were 4 to 6 mm in size. These polyps were       removed with a hot snare. Resection and retrieval were complete.      Internal hemorrhoids were found. The hemorrhoids were small.      The recto-sigmoid colon, sigmoid colon and descending colon were       significantly tortuous. Impression:               - Five 4 to 6 mm polyps in the rectum(2), in the                            descending colon(2) and at the hepatic flexure,                            removed with a hot snare. Resected and retrieved.                           - Internal hemorrhoids.                           - Tortuous LEFT colon. Moderate Sedation:      Moderate  (conscious) sedation was administered by the endoscopy nurse       and supervised by the endoscopist. The following parameters were       monitored: oxygen saturation, heart rate, blood pressure, and response       to care. Total physician intraservice time was 40 minutes. Recommendation:           - Patient has a contact number available for                            emergencies. The signs and symptoms of potential                            delayed complications were discussed with the                            patient. Return to normal activities tomorrow.                            Written discharge instructions were provided to the                            patient.                           - High fiber diet.                           - Continue present medications.                           - Await pathology results.                           -  Repeat colonoscopy in 3 years for surveillance. Procedure Code(s):        --- Professional ---                           [phone], Colonoscopy, flexible; with removal of                            tumor(s), polyp(s), or other lesion(s) by snare                            technique                           G0500, Moderate sedation services provided by the                            same physician or other qualified health care                            professional performing a gastrointestinal                            endoscopic service that sedation supports,                            requiring the presence of an independent trained                            observer to assist in the monitoring of the                            patient's level of consciousness and physiological                            status; initial 15 minutes of intra-service time;                            patient age 58 years or older (additional time may                            be reported with [phone], as appropriate)                           [phone],  Moderate sedation services provided by the                            same physician or other qualified health care                            professional performing the diagnostic or                            therapeutic service that the sedation supports,  requiring the presence of an independent trained                            observer to assist in the monitoring of the                            patient's level of consciousness and physiological                            status; each additional 15 minutes intraservice                            time (List separately in addition to code for                            primary service)                           [phone], Moderate sedation services provided by the                            same physician or other qualified health care                            professional performing the diagnostic or                            therapeutic service that the sedation supports,                            requiring the presence of an independent trained                            observer to assist in the monitoring of the                            patient's level of consciousness and physiological                            status; each additional 15 minutes intraservice                            time (List separately in addition to code for                            primary service) Diagnosis Code(s):        --- Professional ---                           Z12.11, Encounter for screening for malignant                            neoplasm of colon  K62.1, Rectal polyp                           D12.4, Benign neoplasm of descending colon                           D12.3, Benign neoplasm of transverse colon (hepatic                            flexure or splenic flexure)                           K64.8, Other hemorrhoids                           Q43.8, Other specified congenital  malformations of                            intestine CPT copyright 2017 [employer]. All rights reserved. The codes documented in this report are preliminary and upon coder review may  be revised to meet current compliance requirements. Barney Drain, MD Barney Drain MD, MD 08/30/2017 3:45:47 PM This report has been signed electronically. Number of Addenda: 0

## 2017-09-04 ENCOUNTER — Telehealth: Payer: Self-pay | Admitting: Gastroenterology

## 2017-09-04 ENCOUNTER — Encounter (HOSPITAL_COMMUNITY): Payer: Self-pay | Admitting: Gastroenterology

## 2017-09-04 NOTE — Telephone Encounter (Signed)
PT is aware.

## 2017-09-04 NOTE — Telephone Encounter (Signed)
FOUR SIMPLE ADENOMAS AND ONE HYPERPLASTIC POLYP REMOVED.    DRINK WATER TO KEEP YOUR URINE LIGHT YELLOW.  CONTINUE YOUR WEIGHT LOSS EFFORTS. A WEIGHT OF 205 LBS OR LESS  WILL GET YOUR BODY MASS INDEX(BMI) UNDER 30.  FOLLOW A HIGH FIBER DIET. AVOID ITEMS THAT CAUSE BLOATING & GAS.   Next colonoscopy in 3 years.

## 2017-09-05 NOTE — Telephone Encounter (Signed)
Reminder in epic °

## 2017-10-19 ENCOUNTER — Other Ambulatory Visit: Payer: Self-pay | Admitting: Family Medicine

## 2017-11-13 ENCOUNTER — Ambulatory Visit: Payer: Medicare HMO | Admitting: Family Medicine

## 2017-11-17 ENCOUNTER — Other Ambulatory Visit: Payer: Self-pay | Admitting: Family Medicine

## 2017-11-17 DIAGNOSIS — I63312 Cerebral infarction due to thrombosis of left middle cerebral artery: Secondary | ICD-10-CM

## 2017-11-26 ENCOUNTER — Ambulatory Visit (INDEPENDENT_AMBULATORY_CARE_PROVIDER_SITE_OTHER): Payer: Medicare HMO | Admitting: Family Medicine

## 2017-11-26 ENCOUNTER — Encounter: Payer: Self-pay | Admitting: Family Medicine

## 2017-11-26 VITALS — BP 138/84 | Ht 65.5 in | Wt 247.0 lb

## 2017-11-26 DIAGNOSIS — F321 Major depressive disorder, single episode, moderate: Secondary | ICD-10-CM | POA: Diagnosis not present

## 2017-11-26 DIAGNOSIS — Z79899 Other long term (current) drug therapy: Secondary | ICD-10-CM

## 2017-11-26 DIAGNOSIS — I63312 Cerebral infarction due to thrombosis of left middle cerebral artery: Secondary | ICD-10-CM

## 2017-11-26 DIAGNOSIS — E785 Hyperlipidemia, unspecified: Secondary | ICD-10-CM | POA: Diagnosis not present

## 2017-11-26 DIAGNOSIS — Z23 Encounter for immunization: Secondary | ICD-10-CM

## 2017-11-26 DIAGNOSIS — I10 Essential (primary) hypertension: Secondary | ICD-10-CM | POA: Diagnosis not present

## 2017-11-26 DIAGNOSIS — E119 Type 2 diabetes mellitus without complications: Secondary | ICD-10-CM

## 2017-11-26 LAB — POCT GLYCOSYLATED HEMOGLOBIN (HGB A1C): Hemoglobin A1C: 4.7 % (ref 4.0–5.6)

## 2017-11-26 MED ORDER — METFORMIN HCL 1000 MG PO TABS: 1000.0000 mg | ORAL_TABLET | Freq: Two times a day (BID) | ORAL | 1 refills | Status: DC

## 2017-11-26 MED ORDER — ESCITALOPRAM OXALATE 20 MG PO TABS: 20.0000 mg | ORAL_TABLET | Freq: Every day | ORAL | 5 refills | Status: DC

## 2017-11-26 MED ORDER — LISINOPRIL-HYDROCHLOROTHIAZIDE 20-12.5 MG PO TABS: 1.0000 | ORAL_TABLET | Freq: Every day | ORAL | 1 refills | Status: DC

## 2017-11-26 MED ORDER — VERAPAMIL HCL ER 240 MG PO TBCR: 240.0000 mg | EXTENDED_RELEASE_TABLET | Freq: Every morning | ORAL | 1 refills | Status: DC

## 2017-11-26 MED ORDER — ATORVASTATIN CALCIUM 20 MG PO TABS: 20.0000 mg | ORAL_TABLET | Freq: Every day | ORAL | 1 refills | Status: DC

## 2017-11-26 NOTE — Patient Instructions (Signed)
Try claritin or zyrtec each night for allergies

## 2017-11-26 NOTE — Progress Notes (Signed)
   Subjective:    Patient ID: Shannon Price, female    DOB: [birth year], 57 y.o.   MRN: [id number]  Diabetes  She presents for her follow-up diabetic visit. She has type 2 diabetes mellitus. Current diabetic treatments: metformin 1000mg  one bid and glipizide 5mg  one half in the evening. She is compliant with treatment most of the time. She is following a generally healthy diet. Exercise: walks some. Home blood sugar record trend: 80's - 120's. She does not see a podiatrist.Eye exam is not current (last eye exam was about 5 years ago).   Headache every day for the past month. Comes and goes. Takes bc and it does relieve headache. Headache is usually in the morning and comes back at bedtime sometimes. Headache is not severe. Pain on sides of forehead.   Results for orders placed or performed in visit on 11/26/17  POCT glycosylated hemoglobin (Hb A1C)  Result Value Ref Range   Hemoglobin A1C 4.7 4.0 - 5.6 %   HbA1c POC (<> result, manual entry)     HbA1c, POC (prediabetic range)     HbA1c, POC (controlled diabetic range)     Would like flu vaccine today.  Patient continues to take lipid medication regularly. No obvious side effects from it. Generally does not miss a dose. Prior blood work results are reviewed with patient. Patient continues to work on fat intake in diet  Patient notes ongoing compliance with antidepressant medication. No obvious side effects. Reports does not miss a dose. Overall continues to help depression substantially. No thoughts of homicide or suicide. Would like to maintain medication.  Blood pressure medicine and blood pressure levels reviewed today with patient. Compliant with blood pressure medicine. States does not miss a dose. No obvious side effects. Blood pressure generally good when checked elsewhere. Watching salt intake.     Has had frequent low sugar spells lately  Review of Systems No headache, no major weight loss or weight gain, no chest pain no back  pain abdominal pain no change in bowel habits complete ROS otherwise negative     Objective:   Physical Exam Alert and oriented, vitals reviewed and stable, NAD ENT-TM's and ext canals WNL bilat via otoscopic exam Soft palate, tonsils and post pharynx WNL via oropharyngeal exam Neck-symmetric, no masses; thyroid nonpalpable and nontender Pulmonary-no tachypnea or accessory muscle use; Clear without wheezes via auscultation Card--no abnrml murmurs, rhythm reg and rate WNL Carotid pulses symmetric, without bruits        Assessment & Plan:  Impression type 2 diabetes controlled simply too tight.  Discussed.  Stop glipizide.  2.  Hypertension good control discussed maintain same meds  3.  Hyperlipidemia.  Prior blood work reviewed maintain same meds pending blood results.  Appropriate blood work  4.  Depression clinically stable per patient patient to maintain  5.  Headaches.  Discussed.  Likely tension in nature symptom care discussed  6.  History of stroke.  Clinically stable no neurological symptoms.  Greater than 50% of this 40 minute face to face visit was spent in counseling and discussion and coordination of care regarding the above diagnosis/diagnosies   Follow-up in 6 months further recommendations based on results flu shot today.exercise discussed

## 2018-03-08 ENCOUNTER — Inpatient Hospital Stay (HOSPITAL_COMMUNITY)
Admission: EM | Admit: 2018-03-08 | Discharge: 2018-03-09 | DRG: 195 | Disposition: A | Discharge status: Home / Self Care | Payer: Medicare HMO | Attending: Internal Medicine | Admitting: Internal Medicine

## 2018-03-08 ENCOUNTER — Emergency Department (HOSPITAL_COMMUNITY): Payer: Medicare HMO

## 2018-03-08 ENCOUNTER — Other Ambulatory Visit: Payer: Self-pay

## 2018-03-08 ENCOUNTER — Encounter (HOSPITAL_COMMUNITY): Payer: Self-pay | Admitting: Emergency Medicine

## 2018-03-08 DIAGNOSIS — Z79899 Other long term (current) drug therapy: Secondary | ICD-10-CM

## 2018-03-08 DIAGNOSIS — E7800 Pure hypercholesterolemia, unspecified: Secondary | ICD-10-CM | POA: Diagnosis not present

## 2018-03-08 DIAGNOSIS — E785 Hyperlipidemia, unspecified: Secondary | ICD-10-CM | POA: Diagnosis present

## 2018-03-08 DIAGNOSIS — Z720 Tobacco use: Secondary | ICD-10-CM | POA: Diagnosis present

## 2018-03-08 DIAGNOSIS — Z8673 Personal history of transient ischemic attack (TIA), and cerebral infarction without residual deficits: Secondary | ICD-10-CM | POA: Diagnosis not present

## 2018-03-08 DIAGNOSIS — Z6839 Body mass index (BMI) 39.0-39.9, adult: Secondary | ICD-10-CM

## 2018-03-08 DIAGNOSIS — Z8249 Family history of ischemic heart disease and other diseases of the circulatory system: Secondary | ICD-10-CM | POA: Diagnosis not present

## 2018-03-08 DIAGNOSIS — Z981 Arthrodesis status: Secondary | ICD-10-CM

## 2018-03-08 DIAGNOSIS — R000 Tachycardia, unspecified: Secondary | ICD-10-CM | POA: Diagnosis not present

## 2018-03-08 DIAGNOSIS — R0902 Hypoxemia: Secondary | ICD-10-CM | POA: Diagnosis not present

## 2018-03-08 DIAGNOSIS — R05 Cough: Secondary | ICD-10-CM | POA: Diagnosis not present

## 2018-03-08 DIAGNOSIS — J111 Influenza due to unidentified influenza virus with other respiratory manifestations: Secondary | ICD-10-CM | POA: Diagnosis not present

## 2018-03-08 DIAGNOSIS — Z7984 Long term (current) use of oral hypoglycemic drugs: Secondary | ICD-10-CM | POA: Diagnosis not present

## 2018-03-08 DIAGNOSIS — F17210 Nicotine dependence, cigarettes, uncomplicated: Secondary | ICD-10-CM | POA: Diagnosis present

## 2018-03-08 DIAGNOSIS — E119 Type 2 diabetes mellitus without complications: Secondary | ICD-10-CM | POA: Diagnosis present

## 2018-03-08 DIAGNOSIS — J189 Pneumonia, unspecified organism: Secondary | ICD-10-CM | POA: Diagnosis not present

## 2018-03-08 DIAGNOSIS — R0602 Shortness of breath: Secondary | ICD-10-CM | POA: Diagnosis not present

## 2018-03-08 DIAGNOSIS — J181 Lobar pneumonia, unspecified organism: Secondary | ICD-10-CM

## 2018-03-08 DIAGNOSIS — F419 Anxiety disorder, unspecified: Secondary | ICD-10-CM | POA: Diagnosis not present

## 2018-03-08 DIAGNOSIS — I10 Essential (primary) hypertension: Secondary | ICD-10-CM | POA: Diagnosis present

## 2018-03-08 DIAGNOSIS — Z7982 Long term (current) use of aspirin: Secondary | ICD-10-CM

## 2018-03-08 DIAGNOSIS — R6889 Other general symptoms and signs: Secondary | ICD-10-CM

## 2018-03-08 LAB — COMPREHENSIVE METABOLIC PANEL
ALT: 19 U/L (ref 0–44)
ANION GAP: 10 (ref 5–15)
AST: 17 U/L (ref 15–41)
Albumin: 3.8 g/dL (ref 3.5–5.0)
Alkaline Phosphatase: 88 U/L (ref 38–126)
BUN: 8 mg/dL (ref 6–20)
CO2: 24 mmol/L (ref 22–32)
Calcium: 8.8 mg/dL — ABNORMAL LOW (ref 8.9–10.3)
Chloride: 96 mmol/L — ABNORMAL LOW (ref 98–111)
Creatinine, Ser: 0.67 mg/dL (ref 0.44–1.00)
GFR calc Af Amer: >60 mL/min (ref >60)
GFR calc non Af Amer: >60 mL/min (ref >60)
Glucose, Bld: 199 mg/dL — ABNORMAL HIGH (ref 70–99)
POTASSIUM: 4.2 mmol/L (ref 3.5–5.1)
Sodium: 130 mmol/L — ABNORMAL LOW (ref 135–145)
Total Bilirubin: 0.5 mg/dL (ref 0.3–1.2)
Total Protein: 7.2 g/dL (ref 6.5–8.1)

## 2018-03-08 LAB — CBC WITH DIFFERENTIAL/PLATELET
Abs Immature Granulocytes: 0.1 10*3/uL — ABNORMAL HIGH (ref 0.00–0.07)
Basophils Absolute: 0.1 10*3/uL (ref 0.0–0.1)
Basophils Relative: 1 %
Eosinophils Absolute: 0.1 10*3/uL (ref 0.0–0.5)
Eosinophils Relative: 1 %
HCT: 41.6 % (ref 36.0–46.0)
Hemoglobin: 13.4 g/dL (ref 12.0–15.0)
IMMATURE GRANULOCYTES: 1 %
Lymphocytes Relative: 17 %
Lymphs Abs: 1.9 10*3/uL (ref 0.7–4.0)
MCH: 30.2 pg (ref 26.0–34.0)
MCHC: 32.2 g/dL (ref 30.0–36.0)
MCV: 93.9 fL (ref 80.0–100.0)
Monocytes Absolute: 0.9 10*3/uL (ref 0.1–1.0)
Monocytes Relative: 8 %
NEUTROS PCT: 72 %
NRBC: 0 % (ref 0.0–0.2)
Neutro Abs: 7.7 10*3/uL (ref 1.7–7.7)
Platelets: 289 10*3/uL (ref 150–400)
RBC: 4.43 MIL/uL (ref 3.87–5.11)
RDW: 13.4 % (ref 11.5–15.5)
WBC: 10.6 10*3/uL — ABNORMAL HIGH (ref 4.0–10.5)

## 2018-03-08 LAB — INFLUENZA PANEL BY PCR (TYPE A & B)
Influenza A By PCR: NEGATIVE
Influenza B By PCR: NEGATIVE

## 2018-03-08 LAB — LACTIC ACID, PLASMA: Lactic Acid, Venous: 1.6 mmol/L (ref 0.5–1.9)

## 2018-03-08 MED ORDER — METHYLPREDNISOLONE SODIUM SUCC 125 MG IJ SOLR
125.0000 mg | Freq: Once | INTRAMUSCULAR | Status: AC
  Administered 2018-03-08: 125 mg via INTRAVENOUS
  Filled 2018-03-08: qty 2

## 2018-03-08 MED ORDER — ASPIRIN 325 MG PO TABS
325.0000 mg | ORAL_TABLET | Freq: Every day | ORAL | Status: DC
  Administered 2018-03-09: 325 mg via ORAL
  Filled 2018-03-08: qty 1

## 2018-03-08 MED ORDER — SODIUM CHLORIDE 0.9 % IV SOLN: 500.0000 mg | INTRAVENOUS | Status: DC

## 2018-03-08 MED ORDER — LISINOPRIL 10 MG PO TABS
20.0000 mg | ORAL_TABLET | Freq: Every day | ORAL | Status: DC
  Administered 2018-03-09: 20 mg via ORAL
  Filled 2018-03-08: qty 2

## 2018-03-08 MED ORDER — SODIUM CHLORIDE 0.9 % IV SOLN
500.0000 mg | Freq: Once | INTRAVENOUS | Status: AC
  Administered 2018-03-08: 500 mg via INTRAVENOUS
  Filled 2018-03-08 (×2): qty 500

## 2018-03-08 MED ORDER — SODIUM CHLORIDE 0.9% FLUSH: 3.0000 mL | Freq: Two times a day (BID) | INTRAVENOUS | Status: DC

## 2018-03-08 MED ORDER — GUAIFENESIN 100 MG/5ML PO SYRP
200.0000 mg | ORAL_SOLUTION | Freq: Four times a day (QID) | ORAL | Status: DC | PRN
  Filled 2018-03-08: qty 10

## 2018-03-08 MED ORDER — IPRATROPIUM-ALBUTEROL 0.5-2.5 (3) MG/3ML IN SOLN
3.0000 mL | Freq: Once | RESPIRATORY_TRACT | Status: AC
  Administered 2018-03-08: 3 mL via RESPIRATORY_TRACT
  Filled 2018-03-08: qty 3

## 2018-03-08 MED ORDER — ATORVASTATIN CALCIUM 20 MG PO TABS
20.0000 mg | ORAL_TABLET | Freq: Every day | ORAL | Status: DC
  Administered 2018-03-09: 20 mg via ORAL
  Filled 2018-03-08: qty 1

## 2018-03-08 MED ORDER — HYDROCHLOROTHIAZIDE 12.5 MG PO CAPS
12.5000 mg | ORAL_CAPSULE | Freq: Every day | ORAL | Status: DC
  Administered 2018-03-09: 12.5 mg via ORAL
  Filled 2018-03-08: qty 1

## 2018-03-08 MED ORDER — SODIUM CHLORIDE 0.9 % IV SOLN
1.0000 g | Freq: Once | INTRAVENOUS | Status: AC
  Administered 2018-03-08: 1 g via INTRAVENOUS
  Filled 2018-03-08: qty 10

## 2018-03-08 MED ORDER — VERAPAMIL HCL ER 240 MG PO TBCR
240.0000 mg | EXTENDED_RELEASE_TABLET | Freq: Every morning | ORAL | Status: DC
  Administered 2018-03-09: 240 mg via ORAL
  Filled 2018-03-08: qty 1

## 2018-03-08 MED ORDER — PHENYLEPH-CPM-DM-APAP 5-2-10-325 MG PO CAPS: 1.0000 | ORAL_CAPSULE | Freq: Every day | ORAL | Status: DC | PRN

## 2018-03-08 MED ORDER — SODIUM CHLORIDE 0.9 % IV SOLN: 1.0000 g | INTRAVENOUS | Status: DC

## 2018-03-08 MED ORDER — SODIUM CHLORIDE 0.9 % IV SOLN
INTRAVENOUS | Status: DC
  Administered 2018-03-08: 21:00:00 via INTRAVENOUS

## 2018-03-08 MED ORDER — ESCITALOPRAM OXALATE 10 MG PO TABS
20.0000 mg | ORAL_TABLET | Freq: Every day | ORAL | Status: DC
  Administered 2018-03-09: 20 mg via ORAL
  Filled 2018-03-08: qty 2

## 2018-03-08 MED ORDER — LISINOPRIL-HYDROCHLOROTHIAZIDE 20-12.5 MG PO TABS: 1.0000 | ORAL_TABLET | Freq: Every day | ORAL | Status: DC

## 2018-03-08 MED ORDER — SODIUM CHLORIDE 0.9% FLUSH: 3.0000 mL | INTRAVENOUS | Status: DC | PRN

## 2018-03-08 MED ORDER — IPRATROPIUM-ALBUTEROL 0.5-2.5 (3) MG/3ML IN SOLN
3.0000 mL | Freq: Four times a day (QID) | RESPIRATORY_TRACT | Status: DC
  Administered 2018-03-09: 3 mL via RESPIRATORY_TRACT
  Filled 2018-03-08 (×2): qty 3

## 2018-03-08 MED ORDER — ACETAMINOPHEN 500 MG PO TABS: 500.0000 mg | ORAL_TABLET | Freq: Four times a day (QID) | ORAL | Status: DC | PRN

## 2018-03-08 MED ORDER — SODIUM CHLORIDE 0.9 % IV BOLUS
1000.0000 mL | Freq: Once | INTRAVENOUS | Status: AC
  Administered 2018-03-08: 1000 mL via INTRAVENOUS

## 2018-03-08 MED ORDER — SODIUM CHLORIDE 0.9 % IV SOLN: 250.0000 mL | INTRAVENOUS | Status: DC | PRN

## 2018-03-08 NOTE — H&P (Signed)
History and Physical    Shannon Price RCV:[id number] DOB: 19-Sep-1960 DOA: 03/08/2018  PCP: Mikey Kirschner, MD  Patient coming from:  home  Chief Complaint:  Fever cough  HPI: Shannon Price is a 58 y.o. female with medical history significant of dm, htn comes in with fever for 3 days and cough. Pt had flu like illness almost 2 weeks ago, everyone in her house had the flu she herself did not go to the doctor at that time.  She ran fever for about 3 days and recovered.  For about 5 days she was feeling well, then about 3 days ago started coughing a lot and spiking fevers with chills.  Progressive worsening sob.  No cp.  No swelling or edema in legs.  Pt referred for admission for pna with some mild hypoxia.  She is a smoker.  She was wheezing in ed improved with nebs.  Feeling better with some 02 on.  No recent abx usage.   Review of Systems: As per HPI otherwise 10 point review of systems negative.   Past Medical History:  Diagnosis Date  . Anxiety   . CVA (cerebral vascular accident) ([place]) 07/2013   "just affected my speech"  . ZWCHENID(782.4)    "weekly" (07/21/2013)  . High cholesterol   . Hypertension   . Type II diabetes mellitus ([place])     Past Surgical History:  Procedure Laterality Date  . ANTERIOR CERVICAL DECOMP/DISCECTOMY FUSION N/A 08/09/2015   Procedure: Cervical Four- Five, Cervical Five-Six  Anterior cervical decompression/diskectomy/fusion/plate fixation;  Surgeon: Kevan Ny Ditty, MD;  Location: [facility];  Service: Neurosurgery;  Laterality: N/A;  right side approach  . [place]  . COLONOSCOPY N/A 08/30/2017   Procedure: COLONOSCOPY;  Surgeon: Danie Binder, MD;  [facility];  Service: Endoscopy;  Laterality: N/A;  2:00  . POLYPECTOMY  08/30/2017   Procedure: POLYPECTOMY;  Surgeon: Danie Binder, MD;  Location: [facility];  Service: Endoscopy;;  hepatic flexure polyp hot snare Descending colon polyps x 2 hot snare Rectal polyps  x 2 hot snare     reports that she has been smoking cigarettes. She has a 86.00 pack-year smoking history. She has never used smokeless tobacco. She reports current alcohol use. She reports that she does not use drugs.  No Known Allergies  Family History  Problem Relation Age of Onset  . Hypertension Mother   . Hypertension Father     Prior to Admission medications   Medication Sig Start Date End Date Taking? Authorizing Provider  acetaminophen (TYLENOL) 500 MG tablet Take 500 mg by mouth every 6 (six) hours as needed for mild pain or moderate pain.   Yes [provider]  aspirin 325 MG tablet Take 1 tablet (325 mg total) by mouth daily. 10/13/14  Yes Rosalin Hawking, MD  atorvastatin (LIPITOR) 20 MG tablet Take 1 tablet (20 mg total) by mouth daily. 11/26/17  Yes Mikey Kirschner, MD  escitalopram (LEXAPRO) 20 MG tablet Take 1 tablet (20 mg total) by mouth daily. 11/26/17  Yes Mikey Kirschner, MD  guaifenesin (ROBITUSSIN) 100 MG/5ML syrup Take 200 mg by mouth 4 (four) times daily as needed for cough.   Yes [provider]  lisinopril-hydrochlorothiazide (PRINZIDE,ZESTORETIC) 20-12.5 MG tablet Take 1 tablet by mouth daily. 11/26/17  Yes Mikey Kirschner, MD  metFORMIN (GLUCOPHAGE) 1000 MG tablet Take 1 tablet (1,000 mg total) by mouth 2 (two) times daily. 11/26/17  Yes  Mikey Kirschner, MD  Phenyleph-CPM-DM-APAP (ALKA-SELTZER PLUS COLD & COUGH) 05-09-08-325 MG CAPS Take 1-2 capsules by mouth daily as needed (for cold relief).   Yes [provider]  verapamil (CALAN-SR) 240 MG CR tablet Take 1 tablet (240 mg total) by mouth every morning. 11/26/17  Yes Mikey Kirschner, MD    Physical Exam: Vitals:   03/08/18 1800 03/08/18 1852 03/08/18 2023 03/08/18 2154  BP: (!) 164/80   (!) 161/76  Pulse: (!) 111   (!) 102  Resp: (!) 21   (!) 21  Temp:    98.2 F (36.8 C)  TempSrc:    Oral  SpO2: 94% 97% 97% 94%  Weight:      Height:          Constitutional: NAD,  calm, comfortable Vitals:   03/08/18 1800 03/08/18 1852 03/08/18 2023 03/08/18 2154  BP: (!) 164/80   (!) 161/76  Pulse: (!) 111   (!) 102  Resp: (!) 21   (!) 21  Temp:    98.2 F (36.8 C)  TempSrc:    Oral  SpO2: 94% 97% 97% 94%  Weight:      Height:       Eyes: PERRL, lids and conjunctivae normal ENMT: Mucous membranes are moist. Posterior pharynx clear of any exudate or lesions.Normal dentition.  Neck: normal, supple, no masses, no thyromegaly Respiratory: clear to auscultation bilaterally, no wheezing, no crackles. Normal respiratory effort. No accessory muscle use.  Cardiovascular: Regular rate and rhythm, no murmurs / rubs / gallops. No extremity edema. 2+ pedal pulses. No carotid bruits.  Abdomen: no tenderness, no masses palpated. No hepatosplenomegaly. Bowel sounds positive.  Musculoskeletal: no clubbing / cyanosis. No joint deformity upper and lower extremities. Good ROM, no contractures. Normal muscle tone.  Skin: no rashes, lesions, ulcers. No induration Neurologic: CN 2-12 grossly intact. Sensation intact, DTR normal. Strength 5/5 in all 4.  Psychiatric: Normal judgment and insight. Alert and oriented x 3. Normal mood.    Labs on Admission: I have personally reviewed following labs and imaging studies  CBC: Recent Labs  Lab 03/08/18 1815  WBC 10.6*  NEUTROABS 7.7  HGB 13.4  HCT 41.6  MCV 93.9  PLT 161   Basic Metabolic Panel: Recent Labs  Lab 03/08/18 1815  NA 130*  K 4.2  CL 96*  CO2 24  GLUCOSE 199*  BUN 8  CREATININE 0.67  CALCIUM 8.8*   GFR: Estimated Creatinine Clearance: 97.6 mL/min (by C-G formula based on SCr of 0.67 mg/dL). Liver Function Tests: Recent Labs  Lab 03/08/18 1815  AST 17  ALT 19  ALKPHOS 88  BILITOT 0.5  PROT 7.2  ALBUMIN 3.8   No results for input(s): LIPASE, AMYLASE in the last 168 hours. No results for input(s): AMMONIA in the last 168 hours. Coagulation Profile: No results for input(s): INR, PROTIME in the last  168 hours. Cardiac Enzymes: No results for input(s): CKTOTAL, CKMB, CKMBINDEX, TROPONINI in the last 168 hours. BNP (last 3 results) No results for input(s): PROBNP in the last 8760 hours. HbA1C: No results for input(s): HGBA1C in the last 72 hours. CBG: No results for input(s): GLUCAP in the last 168 hours. Lipid Profile: No results for input(s): CHOL, HDL, LDLCALC, TRIG, CHOLHDL, LDLDIRECT in the last 72 hours. Thyroid Function Tests: No results for input(s): TSH, T4TOTAL, FREET4, T3FREE, THYROIDAB in the last 72 hours. Anemia Panel: No results for input(s): VITAMINB12, FOLATE, FERRITIN, TIBC, IRON, RETICCTPCT in the last 72 hours. Urine  analysis: No results found for: COLORURINE, APPEARANCEUR, LABSPEC, PHURINE, GLUCOSEU, HGBUR, BILIRUBINUR, KETONESUR, PROTEINUR, UROBILINOGEN, NITRITE, LEUKOCYTESUR Sepsis Labs: !!!!!!!!!!!!!!!!!!!!!!!!!!!!!!!!!!!!!!!!!!!! @LABRCNTIP (procalcitonin:4,lacticidven:4) )No results found for this or any previous visit (from the past 240 hour(s)).   Radiological Exams on Admission: Dg Chest 2 View  Result Date: 03/08/2018 CLINICAL DATA:  58 y/o F; shortness of breath with productive cough for 3 days. EXAM: CHEST - 2 VIEW COMPARISON:  06/24/2017 CT chest FINDINGS: Stable cardiac silhouette given projection and technique. Right anterior chest wall calcification projecting over right mid lung zone. ACDF hardware, partially visualized. No acute osseous abnormality is evident. Consolidation within the left upper lobe lingula. No pleural effusion or pneumothorax. IMPRESSION: Consolidation within the left upper lobe lingula compatible with pneumonia. Followup PA and lateral chest X-ray is recommended in 3-4 weeks following trial of antibiotic therapy to ensure resolution and exclude underlying malignancy. Electronically Signed   By: Kristine Garbe M.D.   On: 03/08/2018 19:18   Old chart reviewed Case discussed with edp cxr reviewed infiltrate  left  Assessment/Plan 58 yo female with recent flu now with cap Principal Problem:   Community acquired pneumonia of left lower lobe of lung ([place])- obtain blood and sputum cx.  Pt not septic.  Rocephin and azithro ordered.  duonebs ordered.  Given a dose of solumedrol in the ED will not continue.  Active Problems:   Tobacco abuse- needs cessation ed prior to d/c    Diabetes mellitus without complication ([place])- holding metformin    Essential hypertension, benign- resume home meds     DVT prophylaxis: scds Code Status:  full Family Communication: husband Disposition Plan:  days Consults called:  none Admission status:  admission   Lorenna Lurry A MD [facility]  If 7PM-7AM, please contact night-coverage [email or web address] Password [facility]  03/08/2018, 11:33 PM

## 2018-03-08 NOTE — ED Triage Notes (Signed)
Pt c/o of sob with productive cough x 3 days.  Family has had the flu.

## 2018-03-08 NOTE — ED Notes (Signed)
ED TO INPATIENT HANDOFF REPORT  Name/Age/Gender Shannon Price 58 y.o. female  Code Status Code Status History    Date Active Date Inactive Code Status Order ID Comments User Context   08/09/2015 1321 08/09/2015 2114 Full Code [id number]  Ditty, Kevan Ny, MD Inpatient   07/21/2013 1605 07/23/2013 1803 Full Code [id number]  Donne Hazel, MD ED      Home/SNF/Other  Chief Complaint shortness of breath severe  Level of Care/Admitting Diagnosis ED Disposition    ED Disposition Condition [facility] Area: [facility] [[id number]]  Level of Care: Med-Surg [16]  Diagnosis: PNA (pneumonia) [867619]  Admitting Physician: Phillips Grout [[id number]]  Attending Physician: Derrill Kay A [[id number]]  Estimated length of stay: past midnight tomorrow  Certification:: I certify this patient will need inpatient services for at least 2 midnights  PT Class (Do Not Modify): Inpatient [101]  PT Acc Code (Do Not Modify): Private [1]       Medical History Past Medical History:  Diagnosis Date  . Anxiety   . CVA (cerebral vascular accident) ([place]) 07/2013   "just affected my speech"  . JKDTOIZT(245.8)    "weekly" (07/21/2013)  . High cholesterol   . Hypertension   . Type II diabetes mellitus ([facility])     Allergies No Known Allergies  IV Location/Drains/Wounds Patient Lines/Drains/Airways Status   Active Line/Drains/Airways    Name:   Placement date:   Placement time:   Site:   Days:   Peripheral IV 03/08/18 Right Antecubital   03/08/18    1812    Antecubital   less than 1   Incision (Closed) 08/09/15 Neck Other (Comment)   08/09/15    1055     942          Labs/Imaging Results for orders placed or performed during the hospital encounter of 03/08/18 (from the past 48 hour(s))  Comprehensive metabolic panel     Status: Abnormal   Collection Time: 03/08/18  6:15 PM  Result Value Ref Range   Sodium 130 (L) 135 - 145 mmol/L   Potassium 4.2 3.5 - 5.1 mmol/L   Chloride 96  (L) 98 - 111 mmol/L   CO2 24 22 - 32 mmol/L   Glucose, Bld 199 (H) 70 - 99 mg/dL   BUN 8 6 - 20 mg/dL   Creatinine, Ser 0.67 0.44 - 1.00 mg/dL   Calcium 8.8 (L) 8.9 - 10.3 mg/dL   Total Protein 7.2 6.5 - 8.1 g/dL   Albumin 3.8 3.5 - 5.0 g/dL   AST 17 15 - 41 U/L   ALT 19 0 - 44 U/L   Alkaline Phosphatase 88 38 - 126 U/L   Total Bilirubin 0.5 0.3 - 1.2 mg/dL   GFR calc non Af Amer >60 >60 mL/min   GFR calc Af Amer >60 >60 mL/min   Anion gap 10 5 - 15    Comment: Performed at [facility], [address], [place], [place] [address]  CBC with Differential/Platelet     Status: Abnormal   Collection Time: 03/08/18  6:15 PM  Result Value Ref Range   WBC 10.6 (H) 4.0 - 10.5 K/uL   RBC 4.43 3.87 - 5.11 MIL/uL   Hemoglobin 13.4 12.0 - 15.0 g/dL   HCT 41.6 36.0 - 46.0 %   MCV 93.9 80.0 - 100.0 fL   MCH 30.2 26.0 - 34.0 pg   MCHC 32.2 30.0 - 36.0 g/dL   RDW 13.4 11.5 -  15.5 %   Platelets 289 150 - 400 K/uL   nRBC 0.0 0.0 - 0.2 %   Neutrophils Relative % 72 %   Neutro Abs 7.7 1.7 - 7.7 K/uL   Lymphocytes Relative 17 %   Lymphs Abs 1.9 0.7 - 4.0 K/uL   Monocytes Relative 8 %   Monocytes Absolute 0.9 0.1 - 1.0 K/uL   Eosinophils Relative 1 %   Eosinophils Absolute 0.1 0.0 - 0.5 K/uL   Basophils Relative 1 %   Basophils Absolute 0.1 0.0 - 0.1 K/uL   Immature Granulocytes 1 %   Abs Immature Granulocytes 0.10 (H) 0.00 - 0.07 K/uL    Comment: Performed at [facility], [address], [place], [place] [address]  Lactic acid, plasma     Status: None   Collection Time: 03/08/18  9:10 PM  Result Value Ref Range   Lactic Acid, Venous 1.6 0.5 - 1.9 mmol/L    Comment: Performed at [facility], [address], [place], [place] [address]   Dg Chest 2 View  Result Date: 03/08/2018 CLINICAL DATA:  58 y/o F; shortness of breath with productive cough for 3 days. EXAM: CHEST - 2 VIEW COMPARISON:  06/24/2017 CT chest FINDINGS: Stable cardiac silhouette given projection and technique. Right anterior  chest wall calcification projecting over right mid lung zone. ACDF hardware, partially visualized. No acute osseous abnormality is evident. Consolidation within the left upper lobe lingula. No pleural effusion or pneumothorax. IMPRESSION: Consolidation within the left upper lobe lingula compatible with pneumonia. Followup PA and lateral chest X-ray is recommended in 3-4 weeks following trial of antibiotic therapy to ensure resolution and exclude underlying malignancy. Electronically Signed   By: Kristine Garbe M.D.   On: 03/08/2018 19:18    Pending Labs Unresulted Labs (From admission, onward)    Start     Ordered   03/08/18 2023  Influenza panel by PCR (type A & B)  (Influenza PCR Panel)  Once,   R     03/08/18 2022          Vitals/Pain Today's Vitals   03/08/18 1744 03/08/18 1800 03/08/18 1852 03/08/18 2023  BP:  (!) 164/80    Pulse:  (!) 111    Resp:  (!) 21    Temp:      TempSrc:      SpO2:  94% 97% 97%  Weight: 108.9 kg     Height: 5\' 5"  (1.651 m)     PainSc: 5        Isolation Precautions Droplet precaution  Medications Medications  0.9 %  sodium chloride infusion ( Intravenous New Bag/Given 03/08/18 2052)  azithromycin (ZITHROMAX) 500 mg in sodium chloride 0.9 % 250 mL IVPB (500 mg Intravenous New Bag/Given 03/08/18 2126)  sodium chloride 0.9 % bolus 1,000 mL (1,000 mLs Intravenous New Bag/Given 03/08/18 1835)  ipratropium-albuterol (DUONEB) 0.5-2.5 (3) MG/3ML nebulizer solution 3 mL (3 mLs Nebulization Given 03/08/18 1851)  methylPREDNISolone sodium succinate (SOLU-MEDROL) 125 mg/2 mL injection 125 mg (125 mg Intravenous Given 03/08/18 1833)  cefTRIAXone (ROCEPHIN) 1 g in sodium chloride 0.9 % 100 mL IVPB (0 g Intravenous Stopped 03/08/18 2123)    Mobility

## 2018-03-08 NOTE — ED Provider Notes (Addendum)
[facility] EMERGENCY DEPARTMENT Provider Note   CSN: [id number] Arrival date & time: 03/08/18  1725    History   Chief Complaint Chief Complaint  Patient presents with  . Shortness of Breath    HPI Shannon Price is a 58 y.o. female.     Patient presents with the complaint of shortness of breath productive cough is gotten much worse in the last 2 to 3 days.  Prior to that she had what she thought was more like regular flu symptoms.  Several family members have had the flu.  Patient gets very short of breath if she lays down she is short of breath with exertion.  Patient's past medical history is significant for high cholesterol diabetes and reportedly a CVA in 2015.  Also has a history of anxiety.  Patient vital signs here were significant for she was afebrile no fever heart rate around 116 respirate 32 blood pressures have actually been on the higher side 176/94.  Based on that not directly meeting sepsis criteria.  But some concerns due to the elevated respiratory rate and heart rate.  Patient does not have a history of COPD or asthma.  In addition patient has some left-sided chest wall pain worse with coughing     Past Medical History:  Diagnosis Date  . Anxiety   . CVA (cerebral vascular accident) ([place]) 07/2013   "just affected my speech"  . [id number](387.5)    "weekly" (07/21/2013)  . High cholesterol   . Hypertension   . Type II diabetes mellitus [facility])     Patient Active Problem List   Diagnosis Date Noted  . Special screening for malignant neoplasms, colon   . Morbid obesity ([place]) 05/04/2017  . Hidradenitis suppurativa 02/01/2017  . Major depressive disorder, recurrent episode, moderate ([place]) 05/09/2016  . Depression, major, single episode, moderate ([place]) 04/28/2016  . Cervical spondylosis with myelopathy 08/09/2015  . Cerebral infarction due to thrombosis of left middle cerebral artery ([place]) 10/13/2014  . Insomnia 11/03/2013  . Tension headache, chronic 11/03/2013    . Diabetes mellitus without complication ([place]) 64/33/2951  . Essential hypertension, benign 08/05/2013  . Hyperlipidemia LDL goal <100 08/05/2013  . Slurred speech 07/21/2013  . Lacunar infarct, acute ([place]) 07/21/2013  . Tobacco abuse 07/21/2013    Past Surgical History:  Procedure Laterality Date  . ANTERIOR CERVICAL DECOMP/DISCECTOMY FUSION N/A 08/09/2015   Procedure: Cervical Four- Five, Cervical Five-Six  Anterior cervical decompression/diskectomy/fusion/plate fixation;  Surgeon: Kevan Ny Ditty, MD;  Location: [place] NEURO ORS;  Service: Neurosurgery;  Laterality: N/A;  right side approach  . [place]  . COLONOSCOPY N/A 08/30/2017   Procedure: COLONOSCOPY;  Surgeon: Danie Binder, MD;  Location[facility];  Service: Endoscopy;  Laterality: N/A;  2:00  . POLYPECTOMY  08/30/2017   Procedure: POLYPECTOMY;  Surgeon: Danie Binder, MD;  Location: [facility] END[facility];  Service: Endoscopy;;  hepatic flexure polyp hot snare Descending colon polyps x 2 hot snare Rectal polyps x 2 hot snare     OB History   No obstetric history on file.      Home Medications    Prior to Admission medications   Medication Sig Start Date End Date Taking? Authorizing Provider  aspirin 325 MG tablet Take 1 tablet (325 mg total) by mouth daily. 10/13/14   Rosalin Hawking, MD  Aspirin-Salicylamide-Caffe[facility] HEADACHE POWDER PO) Take 2 packets by mouth daily as needed (for pain or headache).     [provider]  atorvastatin (LIPITOR) 20 MG tablet Take 1 tablet (20 mg total) by mouth daily. 11/26/17   Mikey Kirschner, MD  escitalopram (LEXAPRO) 20 MG tablet Take 1 tablet (20 mg total) by mouth daily. 11/26/17   Mikey Kirschner, MD  lisinopril-hydrochlorothiazide (PRINZIDE,ZESTORETIC) 20-12.5 MG tablet Take 1 tablet by mouth daily. 11/26/17   Mikey Kirschner, MD  metFORMIN (GLUCOPHAGE) 1000 MG tablet Take 1 tablet (1,000 mg total) by mouth 2 (two) times daily. 11/26/17   Mikey Kirschner, MD  triamcinolone cream (KENALOG) 0.1 % Apply 1 application topically 2 (two) times daily. Prn rash; use up to 2 weeks Patient taking differently: Apply 1 application topically daily as needed (for rash).  08/02/17   Nilda Simmer, NP  verapamil (CALAN-SR) 240 MG CR tablet Take 1 tablet (240 mg total) by mouth every morning. 11/26/17   Mikey Kirschner, MD    Family History Family History  Problem Relation Age of Onset  . Hypertension Mother   . Hypertension Father     Social History Social History   Tobacco Use  . Smoking status: Current Every Day Smoker    Packs/day: 2.00    Years: 43.00    Pack years: 86.00    Types: Cigarettes  . Smokeless tobacco: Never Used  Substance Use Topics  . Alcohol use: Yes    Comment: occ, 05-09-2016 per pt occa.  . Drug use: No    Comment: 05-09-2016 per pt no     Allergies   Patient has no known allergies.   Review of Systems Review of Systems  Constitutional: Negative for chills and fever.  HENT: Positive for congestion. Negative for rhinorrhea and sore throat.   Eyes: Negative for visual disturbance.  Respiratory: Positive for cough. Negative for shortness of breath.   Cardiovascular: Positive for chest pain. Negative for leg swelling.  Gastrointestinal: Negative for abdominal pain, diarrhea, nausea and vomiting.  Genitourinary: Negative for dysuria.  Musculoskeletal: Positive for myalgias. Negative for back pain and neck pain.  Skin: Negative for rash.  Neurological: Negative for dizziness, light-headedness and headaches.  Hematological: Does not bruise/bleed easily.  Psychiatric/Behavioral: Negative for confusion.     Physical Exam Updated Vital Signs BP (!) 164/80   Pulse (!) 111   Temp 98.9 F (37.2 C) (Oral)   Resp (!) 21   Ht 1.651 m (5\' 5" )   Wt 108.9 kg   SpO2 97%   BMI 39.94 kg/m   Physical Exam Vitals signs and nursing note reviewed.  Constitutional:      General: She is not in acute  distress.    Appearance: She is well-developed.  HENT:     Head: Normocephalic and atraumatic.     Nose: Congestion present.     Mouth/Throat:     Mouth: Mucous membranes are dry.     Pharynx: No oropharyngeal exudate or posterior oropharyngeal erythema.  Eyes:     Extraocular Movements: Extraocular movements intact.     Conjunctiva/sclera: Conjunctivae normal.     Pupils: Pupils are equal, round, and reactive to light.  Neck:     Musculoskeletal: Normal range of motion and neck supple. No neck rigidity.  Cardiovascular:     Rate and Rhythm: Regular rhythm. Tachycardia present.     Heart sounds: No murmur.  Pulmonary:     Effort: Pulmonary effort is normal. No respiratory distress.     Breath sounds: Rhonchi present. No wheezing or rales.  Abdominal:     General: Bowel sounds  are normal.     Palpations: Abdomen is soft.     Tenderness: There is no abdominal tenderness.  Musculoskeletal: Normal range of motion.  Skin:    General: Skin is warm and dry.     Capillary Refill: Capillary refill takes less than 2 seconds.  Neurological:     General: No focal deficit present.     Mental Status: She is alert and oriented to person, place, and time.      ED Treatments / Results  Labs (all labs ordered are listed, but only abnormal results are displayed) Labs Reviewed  COMPREHENSIVE METABOLIC PANEL - Abnormal; Notable for the following components:      Result Value   Sodium 130 (*)    Chloride 96 (*)    Glucose, Bld 199 (*)    Calcium 8.8 (*)    All other components within normal limits  CBC WITH DIFFERENTIAL/PLATELET - Abnormal; Notable for the following components:   WBC 10.6 (*)    Abs Immature Granulocytes 0.10 (*)    All other components within normal limits  INFLUENZA PANEL BY PCR (TYPE A & B)  LACTIC ACID, PLASMA    EKG EKG Interpretation  Date/Time:  Saturday March 08 2018 17:41:46 EST Ventricular Rate:  112 PR Interval:    QRS Duration: 100 QT  Interval:  351 QTC Calculation: 480 R Axis:   65 Text Interpretation:  Sinus tachycardia Low voltage, precordial leads Confirmed by Fredia Sorrow [phone]) on 03/08/2018 7:26:03 PM   Radiology Dg Chest 2 View  Result Date: 03/08/2018 CLINICAL DATA:  59 y/o F; shortness of breath with productive cough for 3 days. EXAM: CHEST - 2 VIEW COMPARISON:  06/24/2017 CT chest FINDINGS: Stable cardiac silhouette given projection and technique. Right anterior chest wall calcification projecting over right mid lung zone. ACDF hardware, partially visualized. No acute osseous abnormality is evident. Consolidation within the left upper lobe lingula. No pleural effusion or pneumothorax. IMPRESSION: Consolidation within the left upper lobe lingula compatible with pneumonia. Followup PA and lateral chest X-ray is recommended in 3-4 weeks following trial of antibiotic therapy to ensure resolution and exclude underlying malignancy. Electronically Signed   By: Kristine Garbe M.D.   On: 03/08/2018 19:18    Procedures Procedures (including critical care time)  Medications Ordered in ED Medications  0.9 %  sodium chloride infusion (has no administration in time range)  cefTRIAXone (ROCEPHIN) 1 g in sodium chloride 0.9 % 100 mL IVPB (has no administration in time range)  azithromycin (ZITHROMAX) 500 mg in sodium chloride 0.9 % 250 mL IVPB (has no administration in time range)  sodium chloride 0.9 % bolus 1,000 mL (1,000 mLs Intravenous New Bag/Given 03/08/18 1835)  ipratropium-albuterol (DUONEB) 0.5-2.5 (3) MG/3ML nebulizer solution 3 mL (3 mLs Nebulization Given 03/08/18 1851)  methylPREDNISolone sodium succinate (SOLU-MEDROL) 125 mg/2 mL injection 125 mg (125 mg Intravenous Given 03/08/18 1833)     Initial Impression / Assessment and Plan / ED Course  I have reviewed the triage vital signs and the nursing notes.  Pertinent labs & imaging results that were available during my care of the patient were  reviewed by me and considered in my medical decision making (see chart for details).       Patient sounds if she has had the flulike illness.  But suddenly got worse about 2 to 3 days ago with increased shortness of breath and feeling worse.  Chest x-ray consistent with a left-sided pneumonia.  This would be community-acquired patient has  not had a hospitalization in the last 90 days.  Influenza testing is pending.  Lactic acid is pending.  Patient upon arrival was not wheezing but was given a nebulizer treatment and Solu-Medrol.  Patient with some improvement in her breathing.  She states that it feels better but her oxygen saturations when she first got here would drop down below 90 on room air.  And even now with a little bit of movement they will get into the upper 80s.  So patient started on 2 L of oxygen.  Patient will be treated with Rocephin and Zithromax.  Patient never had any wheezing but is feeling better after the nebulizer treatment.  In addition patient given 1 L of fluid and heart rate has improved some but still slightly above 100.  Initial EKG consistent with sinus tachycardia.  Patient discussed with hospitalist who will admit.  Patient should be stable for general medical floor.     Final Clinical Impressions(s) / ED Diagnoses   Final diagnoses:  Flu-like symptoms  Community acquired pneumonia of left lower lobe of lung [facility])  Hypoxia    ED Discharge Orders    None       Fredia Sorrow, MD 03/08/18 2047    Fredia Sorrow, MD 03/08/18 2048

## 2018-03-09 LAB — BASIC METABOLIC PANEL
Anion gap: 13 (ref 5–15)
BUN: 11 mg/dL (ref 6–20)
CO2: 19 mmol/L — ABNORMAL LOW (ref 22–32)
Calcium: 8.8 mg/dL — ABNORMAL LOW (ref 8.9–10.3)
Chloride: 102 mmol/L (ref 98–111)
Creatinine, Ser: 0.65 mg/dL (ref 0.44–1.00)
GFR calc Af Amer: >60 mL/min (ref >60)
GFR calc non Af Amer: >60 mL/min (ref >60)
GLUCOSE: 293 mg/dL — AB (ref 70–99)
Potassium: 3.7 mmol/L (ref 3.5–5.1)
Sodium: 134 mmol/L — ABNORMAL LOW (ref 135–145)

## 2018-03-09 LAB — CBC WITH DIFFERENTIAL/PLATELET
Abs Immature Granulocytes: 0.12 10*3/uL — ABNORMAL HIGH (ref 0.00–0.07)
Basophils Absolute: 0 10*3/uL (ref 0.0–0.1)
Basophils Relative: 0 %
Eosinophils Absolute: 0 10*3/uL (ref 0.0–0.5)
Eosinophils Relative: 0 %
HCT: 43.7 % (ref 36.0–46.0)
Hemoglobin: 13.9 g/dL (ref 12.0–15.0)
Immature Granulocytes: 1 %
Lymphocytes Relative: 12 %
Lymphs Abs: 1.2 10*3/uL (ref 0.7–4.0)
MCH: 30.5 pg (ref 26.0–34.0)
MCHC: 31.8 g/dL (ref 30.0–36.0)
MCV: 96 fL (ref 80.0–100.0)
MONOS PCT: 1 %
Monocytes Absolute: 0.1 10*3/uL (ref 0.1–1.0)
Neutro Abs: 8.9 10*3/uL — ABNORMAL HIGH (ref 1.7–7.7)
Neutrophils Relative %: 86 %
Platelets: 301 10*3/uL (ref 150–400)
RBC: 4.55 MIL/uL (ref 3.87–5.11)
RDW: 13.3 % (ref 11.5–15.5)
WBC: 10.4 10*3/uL (ref 4.0–10.5)
nRBC: 0 % (ref 0.0–0.2)

## 2018-03-09 MED ORDER — CEFDINIR 300 MG PO CAPS: 300.0000 mg | ORAL_CAPSULE | Freq: Two times a day (BID) | ORAL | 0 refills | Status: AC

## 2018-03-09 MED ORDER — AZITHROMYCIN 500 MG PO TABS: 500.0000 mg | ORAL_TABLET | Freq: Every day | ORAL | 0 refills | Status: AC

## 2018-03-09 NOTE — Discharge Summary (Signed)
Physician Discharge Summary  Shannon Price JME:[id number] DOB: February 08, 1960 DOA: 03/08/2018  PCP: Mikey Kirschner, MD  Admit date: 03/08/2018  Discharge date: 03/09/2018  Admitted From:Home  Disposition:  Home  Recommendations for Outpatient Follow-up:  1. Follow up with PCP in 1-2 weeks 2. Continue on cefdinir and azithromycin for 6 more days to complete 7-day course as prescribed to complete course of treatment  Home Health: None  Equipment/Devices: None  Discharge Condition: Stable  CODE STATUS: Full  Diet recommendation: Heart Healthy/carb modified  Brief/Interim Summary: Per HPI:  Shannon Price is a 58 y.o. female with medical history significant of dm, htn comes in with fever for 3 days and cough. Pt had flu like illness almost 2 weeks ago, everyone in her house had the flu she herself did not go to the doctor at that time.  She ran fever for about 3 days and recovered.  For about 5 days she was feeling well, then about 3 days ago started coughing a lot and spiking fevers with chills.  Progressive worsening sob.  No cp.  No swelling or edema in legs.  Pt referred for admission for pna with some mild hypoxia.  She is a smoker.  She was wheezing in ed improved with nebs.  Feeling better with some 02 on.  No recent abx usage.   Patient was admitted with community-acquired pneumonia of the left lower lobe.  She is responded extremely well to IV antibiotics overnight with azithromycin and Rocephin.  She states that she is "feeling 100% better" this morning and would like to go home.  She is no longer on any oxygen, nor does she report any fever or cough.  She is agreeable to following up with her PCP in 1 week and will continue on oral antibiotics as prescribed to complete the course of treatment.  Discharge Diagnoses:  Principal Problem:   Community acquired pneumonia of left lower lobe of lung ([place]) Active Problems:   Tobacco abuse   Diabetes mellitus without complication ([place])   Essential hypertension, benign  Principal discharge diagnosis: Community-acquired pneumonia  Discharge Instructions  Discharge Instructions    Diet - low sodium heart healthy   Complete by:  As directed    Increase activity slowly   Complete by:  As directed      Allergies as of 03/09/2018   No Known Allergies     Medication List    TAKE these medications   acetaminophen 500 MG tablet Commonly known as:  TYLENOL Take 500 mg by mouth every 6 (six) hours as needed for mild pain or moderate pain.   ALKA-SELTZER PLUS COLD & COUGH 05-09-08-325 MG Caps Generic drug:  Phenyleph-CPM-DM-APAP Take 1-2 capsules by mouth daily as needed (for cold relief).   aspirin 325 MG tablet Take 1 tablet (325 mg total) by mouth daily.   atorvastatin 20 MG tablet Commonly known as:  LIPITOR Take 1 tablet (20 mg total) by mouth daily.   azithromycin 500 MG tablet Commonly known as:  ZITHROMAX Take 1 tablet (500 mg total) by mouth daily for 6 days. Take 1 tablet daily for 3 days.   cefdinir 300 MG capsule Commonly known as:  OMNICEF Take 1 capsule (300 mg total) by mouth 2 (two) times daily for 6 days.   escitalopram 20 MG tablet Commonly known as:  LEXAPRO Take 1 tablet (20 mg total) by mouth daily.   guaifenesin 100 MG/5ML syrup Commonly known as:  ROBITUSSIN Take 200 mg by mouth  4 (four) times daily as needed for cough.   lisinopril-hydrochlorothiazide 20-12.5 MG tablet Commonly known as:  PRINZIDE,ZESTORETIC Take 1 tablet by mouth daily.   metFORMIN 1000 MG tablet Commonly known as:  GLUCOPHAGE Take 1 tablet (1,000 mg total) by mouth 2 (two) times daily.   verapamil 240 MG CR tablet Commonly known as:  CALAN-SR Take 1 tablet (240 mg total) by mouth every morning.      Follow-up Information    Mikey Kirschner, MD Follow up in 1 week(s).   Specialty:  Family Medicine Contact information: [address] [place] [address] [phone]          No Known  Allergies  Consultations:  None   Procedures/Studies: Dg Chest 2 View  Result Date: 03/08/2018 CLINICAL DATA:  58 y/o F; shortness of breath with productive cough for 3 days. EXAM: CHEST - 2 VIEW COMPARISON:  06/24/2017 CT chest FINDINGS: Stable cardiac silhouette given projection and technique. Right anterior chest wall calcification projecting over right mid lung zone. ACDF hardware, partially visualized. No acute osseous abnormality is evident. Consolidation within the left upper lobe lingula. No pleural effusion or pneumothorax. IMPRESSION: Consolidation within the left upper lobe lingula compatible with pneumonia. Followup PA and lateral chest X-ray is recommended in 3-4 weeks following trial of antibiotic therapy to ensure resolution and exclude underlying malignancy. Electronically Signed   By: Kristine Garbe M.D.   On: 03/08/2018 19:18     Discharge Exam: Vitals:   03/08/18 2332 03/09/18 0636  BP:  (!) 148/81  Pulse:  86  Resp:  20  Temp:  98 F (36.7 C)  SpO2: 94% 98%   Vitals:   03/08/18 2023 03/08/18 2154 03/08/18 2332 03/09/18 0636  BP:  (!) 161/76  (!) 148/81  Pulse:  (!) 102  86  Resp:  (!) 21  20  Temp:  98.2 F (36.8 C)  98 F (36.7 C)  TempSrc:  Oral  Oral  SpO2: 97% 94% 94% 98%  Weight:      Height:        General: Pt is alert, awake, not in acute distress Cardiovascular: RRR, S1/S2 +, no rubs, no gallops Respiratory: CTA bilaterally, no wheezing, no rhonchi Abdominal: Soft, NT, ND, bowel sounds + Extremities: no edema, no cyanosis    The results of significant diagnostics from this hospitalization (including imaging, microbiology, ancillary and laboratory) are listed below for reference.     Microbiology: Recent Results (from the past 240 hour(s))  Culture, blood (routine x 2) Call MD if unable to obtain prior to antibiotics being given     Status: None (Preliminary result)   Collection Time: 03/09/18  5:18 AM  Result Value Ref Range  Status   Specimen Description   Final    LEFT ANTECUBITAL BOTTLES DRAWN AEROBIC AND ANAEROBIC   Special Requests   Final    Blood Culture adequate volume Performed at [facility], [address], [place], [place] [address]    Culture PENDING  Incomplete   Report Status PENDING  Incomplete  Culture, blood (routine x 2) Call MD if unable to obtain prior to antibiotics being given     Status: None (Preliminary result)   Collection Time: 03/09/18  5:29 AM  Result Value Ref Range Status   Specimen Description BLOOD RIGHT HAND BOTTLES DRAWN AEROBIC ONLY  Final   Special Requests   Final    Blood Culture adequate volume Performed at [facility], [address] [place],  [place] [address]    Culture PENDING  Incomplete   Report Status PENDING  Incomplete     Labs: BNP (last 3 results) No results for input(s): BNP in the last 8760 hours. Basic Metabolic Panel: Recent Labs  Lab 03/08/18 1815 03/09/18 0518  NA 130* 134*  K 4.2 3.7  CL 96* 102  CO2 24 19*  GLUCOSE 199* 293*  BUN 8 11  CREATININE 0.67 0.65  CALCIUM 8.8* 8.8*   Liver Function Tests: Recent Labs  Lab 03/08/18 1815  AST 17  ALT 19  ALKPHOS 88  BILITOT 0.5  PROT 7.2  ALBUMIN 3.8   No results for input(s): LIPASE, AMYLASE in the last 168 hours. No results for input(s): AMMONIA in the last 168 hours. CBC: Recent Labs  Lab 03/08/18 1815 03/09/18 0518  WBC 10.6* 10.4  NEUTROABS 7.7 8.9*  HGB 13.4 13.9  HCT 41.6 43.7  MCV 93.9 96.0  PLT 289 301   Cardiac Enzymes: No results for input(s): CKTOTAL, CKMB, CKMBINDEX, TROPONINI in the last 168 hours. BNP: Invalid input(s): POCBNP CBG: No results for input(s): GLUCAP in the last 168 hours. D-Dimer No results for input(s): DDIMER in the last 72 hours. Hgb A1c No results for input(s): HGBA1C in the last 72 hours. Lipid Profile No results for input(s): CHOL, HDL, LDLCALC, TRIG, CHOLHDL, LDLDIRECT in the last 72 hours. Thyroid function studies No results  for input(s): TSH, T4TOTAL, T3FREE, THYROIDAB in the last 72 hours.  Invalid input(s): FREET3 Anemia work up No results for input(s): VITAMINB12, FOLATE, FERRITIN, TIBC, IRON, RETICCTPCT in the last 72 hours. Urinalysis No results found for: COLORURINE, APPEARANCEUR, [place], [place], GLUCOSEU, [place], [place], [place], [place], UROBILINOGEN, NITRITE, LEUKOCYTESUR Sepsis Labs Invalid input(s): PROCALCITONIN,  WBC,  LACTICIDVEN Microbiology Recent Results (from the past 240 hour(s))  Culture, blood (routine x 2) Call MD if unable to obtain prior to antibiotics being given     Status: None (Preliminary result)   Collection Time: 03/09/18  5:18 AM  Result Value Ref Range Status   Specimen Description   Final    LEFT ANTECUBITAL BOTTLES DRAWN AEROBIC AND ANAEROBIC   Special Requests   Final    Blood Culture adequate volume Performed at [facility], [address] [place], [place] [address]    Culture PENDING  Incomplete   Report Status PENDING  Incomplete  Culture, blood (routine x 2) Call MD if unable to obtain prior to antibiotics being given     Status: None (Preliminary result)   Collection Time: 03/09/18  5:29 AM  Result Value Ref Range Status   Specimen Description BLOOD RIGHT HAND BOTTLES DRAWN AEROBIC ONLY  Final   Special Requests   Final    Blood Culture adequate volume Performed at [facility], [address] [place], [place] [address]    Culture PENDING  Incomplete   Report Status PENDING  Incomplete     Time coordinating discharge: 35 minutes  SIGNED:   Rodena Goldmann, DO [facility] 03/09/2018, 9:20 AM  If 7PM-7AM, please contact night-coverage [email or web address] Password [email or web address]

## 2018-03-09 NOTE — Progress Notes (Signed)
Patient removed IV upon entering room to review discharge information.  Reviewed AVS with patient who verbalized understanding and all questions answered.  Patient taken down to car by wheelchair and transported home by her husband.

## 2018-03-14 LAB — CULTURE, BLOOD (ROUTINE X 2)
Culture: NO GROWTH
Culture: NO GROWTH
Special Requests: ADEQUATE
Special Requests: ADEQUATE

## 2018-05-27 ENCOUNTER — Ambulatory Visit: Payer: Medicare HMO | Admitting: Family Medicine

## 2018-07-30 ENCOUNTER — Other Ambulatory Visit (HOSPITAL_COMMUNITY): Payer: Self-pay | Admitting: Family Medicine

## 2018-07-30 DIAGNOSIS — Z1231 Encounter for screening mammogram for malignant neoplasm of breast: Secondary | ICD-10-CM

## 2018-09-07 ENCOUNTER — Other Ambulatory Visit: Payer: Self-pay | Admitting: Family Medicine

## 2018-09-09 NOTE — Telephone Encounter (Signed)
LVM to schedule appointment.

## 2018-09-09 NOTE — Telephone Encounter (Signed)
Call pt sched a virt f u visit, then may ref times one

## 2018-09-09 NOTE — Telephone Encounter (Signed)
Please call and schedule and then send back to nurses to send in refill. thanks

## 2018-09-10 ENCOUNTER — Encounter: Payer: Self-pay | Admitting: Family Medicine

## 2018-09-10 ENCOUNTER — Other Ambulatory Visit: Payer: Self-pay

## 2018-09-10 ENCOUNTER — Ambulatory Visit (INDEPENDENT_AMBULATORY_CARE_PROVIDER_SITE_OTHER): Payer: Medicare HMO | Admitting: Family Medicine

## 2018-09-10 DIAGNOSIS — J189 Pneumonia, unspecified organism: Secondary | ICD-10-CM

## 2018-09-10 DIAGNOSIS — E119 Type 2 diabetes mellitus without complications: Secondary | ICD-10-CM

## 2018-09-10 DIAGNOSIS — I10 Essential (primary) hypertension: Secondary | ICD-10-CM

## 2018-09-10 DIAGNOSIS — E785 Hyperlipidemia, unspecified: Secondary | ICD-10-CM

## 2018-09-10 DIAGNOSIS — Z79899 Other long term (current) drug therapy: Secondary | ICD-10-CM | POA: Diagnosis not present

## 2018-09-10 MED ORDER — CLONAZEPAM 0.5 MG PO TABS: ORAL_TABLET | ORAL | 5 refills | Status: DC

## 2018-09-10 NOTE — Telephone Encounter (Signed)
Phone visit scheduled for 09/10/2018

## 2018-09-10 NOTE — Progress Notes (Signed)
   Subjective:  Audio plus video patient calls are numerous concerns  Patient ID: Shannon Price, female    DOB: 11/18/1960, 58 y.o.   MRN: [id number]  Pt has not been able to check bp.   Diabetes She presents for her follow-up diabetic visit. She has type 2 diabetes mellitus. She is compliant with treatment most of the time (does forget occassionaly). She is following a generally healthy diet. She participates in exercise intermittently. Home blood sugar record trend: always under 200. Eye exam is not current.   Virtual Visit via Telephone Note  I connected with Jacques Navy on 09/10/18 at  3:00 PM EDT by telephone and verified that I am speaking with the correct person using two identifiers.  Location: Patient: home Provider: office   I discussed the limitations, risks, security and privacy concerns of performing an evaluation and management service by telephone and the availability of in person appointments. I also discussed with the patient that there may be a patient responsible charge related to this service. The patient expressed understanding and agreed to proceed.   History of Present Illness:    Observations/Objective:   Assessment and Plan:   Follow Up Instructions:    I discussed the assessment and treatment plan with the patient. The patient was provided an opportunity to ask questions and all were answered. The patient agreed with the plan and demonstrated an understanding of the instructions.   The patient was advised to call back or seek an in-person evaluation if the symptoms worsen or if the condition fails to improve as anticipated.  I provided 64minutes of non-face-to-face time during this encounter.  Blood pressure medicine and blood pressure levels reviewed today with patient. Compliant with blood pressure medicine. States does not miss a dose. No obvious side effects. Blood pressure generally good when checked elsewhere. Watching salt intake.   Patient  claims compliance with diabetes medication. No obvious side effects. Reports no substantial low sugar spells. Most numbers are generally in good range when checked fasting. Generally does not miss a dose of medication. Watching diabetic diet closely  Patient continues to take lipid medication regularly. No obvious side effects from it. Generally does not miss a dose. Prior blood work results are reviewed with patient. Patient continues to work on fat intake in diet   Hospitalization from March reviewed.  Was sent for pneumonia.  Advised to follow-up with [place].  She did not.  And x-ray showed pneumonia and the radiologist recommended a follow-up x-ray  Currently breathing fine with no cough.  Unfortunately still smoking  Review of Systems No headache, no major weight loss or weight gain, no chest pain no back pain abdominal pain no change in bowel habits complete ROS otherwise negative     Objective:   Physical Exam  Virtual      Assessment & Plan:  Impression #1 type 2 diabetes exact control uncertain await [id number] when sensing therapy  2.  Hypertension blood pressure good earlier this spring discussed to maintain  3.  Hyperlipidemia status uncertain await blood work compliance discussed  4.  Status post pneumonia with abnormal chest x-ray will repeat chest x-ray to assess Diet discussed.  Exercise discussed smoking cessation discussed  5.  Status post stroke.  Patient still on aspirin.  No TIA symptoms   Follow-up in 6 months

## 2018-09-12 ENCOUNTER — Other Ambulatory Visit: Payer: Self-pay

## 2018-09-12 ENCOUNTER — Ambulatory Visit (HOSPITAL_COMMUNITY)
Admission: RE | Admit: 2018-09-12 | Discharge: 2018-09-12 | Disposition: A | Discharge status: Home / Self Care | Payer: Medicare HMO | Source: Ambulatory Visit | Attending: Family Medicine | Admitting: Family Medicine

## 2018-09-12 DIAGNOSIS — E785 Hyperlipidemia, unspecified: Secondary | ICD-10-CM | POA: Diagnosis not present

## 2018-09-12 DIAGNOSIS — E119 Type 2 diabetes mellitus without complications: Secondary | ICD-10-CM | POA: Diagnosis not present

## 2018-09-12 DIAGNOSIS — R05 Cough: Secondary | ICD-10-CM | POA: Diagnosis not present

## 2018-09-12 DIAGNOSIS — J189 Pneumonia, unspecified organism: Secondary | ICD-10-CM | POA: Insufficient documentation

## 2018-09-12 DIAGNOSIS — Z79899 Other long term (current) drug therapy: Secondary | ICD-10-CM | POA: Diagnosis not present

## 2018-09-13 LAB — MICROALBUMIN / CREATININE URINE RATIO
Creatinine, Urine: 44.9 mg/dL
Microalb/Creat Ratio: 141 mg/g creat — ABNORMAL HIGH (ref 0–29)
Microalbumin, Urine: 63.5 ug/mL

## 2018-09-13 LAB — LIPID PANEL
Chol/HDL Ratio: 4 ratio (ref 0.0–4.4)
Cholesterol, Total: 217 mg/dL — ABNORMAL HIGH (ref 100–199)
HDL: 54 mg/dL (ref >39)
LDL Chol Calc (NIH): 136 mg/dL — ABNORMAL HIGH (ref 0–99)
Triglycerides: 149 mg/dL (ref 0–149)
VLDL Cholesterol Cal: 27 mg/dL (ref 5–40)

## 2018-09-13 LAB — HEPATIC FUNCTION PANEL
ALT: 32 IU/L (ref 0–32)
AST: 20 IU/L (ref 0–40)
Albumin: 4.5 g/dL (ref 3.8–4.9)
Alkaline Phosphatase: 103 IU/L (ref 39–117)
Bilirubin Total: 0.3 mg/dL (ref 0.0–1.2)
Bilirubin, Direct: 0.09 mg/dL (ref 0.00–0.40)
Total Protein: 7.1 g/dL (ref 6.0–8.5)

## 2018-09-13 LAB — BASIC METABOLIC PANEL
BUN/Creatinine Ratio: 8 — ABNORMAL LOW (ref 9–23)
BUN: 6 mg/dL (ref 6–24)
CO2: 20 mmol/L (ref 20–29)
Calcium: 9.6 mg/dL (ref 8.7–10.2)
Chloride: 92 mmol/L — ABNORMAL LOW (ref 96–106)
Creatinine, Ser: 0.73 mg/dL (ref 0.57–1.00)
GFR calc Af Amer: 106 mL/min/{1.73_m2} (ref >59)
GFR calc non Af Amer: 92 mL/min/{1.73_m2} (ref >59)
Glucose: 117 mg/dL — ABNORMAL HIGH (ref 65–99)
Potassium: 5.4 mmol/L — ABNORMAL HIGH (ref 3.5–5.2)
Sodium: 129 mmol/L — ABNORMAL LOW (ref 134–144)

## 2018-09-13 LAB — HEMOGLOBIN A1C
Est. average glucose Bld gHb Est-mCnc: 126 mg/dL
Hgb A1c MFr Bld: 6 % — ABNORMAL HIGH (ref 4.8–5.6)

## 2018-09-19 ENCOUNTER — Other Ambulatory Visit: Payer: Self-pay

## 2018-09-19 MED ORDER — CLONAZEPAM 0.5 MG PO TABS: ORAL_TABLET | ORAL | 5 refills | Status: AC

## 2018-09-30 ENCOUNTER — Telehealth: Payer: Self-pay | Admitting: Family Medicine

## 2018-09-30 ENCOUNTER — Other Ambulatory Visit: Payer: Self-pay

## 2018-09-30 ENCOUNTER — Other Ambulatory Visit: Payer: Self-pay | Admitting: *Deleted

## 2018-09-30 DIAGNOSIS — I63312 Cerebral infarction due to thrombosis of left middle cerebral artery: Secondary | ICD-10-CM

## 2018-09-30 DIAGNOSIS — F321 Major depressive disorder, single episode, moderate: Secondary | ICD-10-CM

## 2018-09-30 MED ORDER — VERAPAMIL HCL ER 240 MG PO TBCR: 240.0000 mg | EXTENDED_RELEASE_TABLET | Freq: Every morning | ORAL | 1 refills | Status: DC

## 2018-09-30 MED ORDER — METFORMIN HCL 1000 MG PO TABS: 1000.0000 mg | ORAL_TABLET | Freq: Two times a day (BID) | ORAL | 1 refills | Status: DC

## 2018-09-30 MED ORDER — ATORVASTATIN CALCIUM 20 MG PO TABS: 20.0000 mg | ORAL_TABLET | Freq: Every day | ORAL | 1 refills | Status: DC

## 2018-09-30 NOTE — Telephone Encounter (Signed)
Pharmacy requesting refill on Escitalopram 20 mg tablets. Pt last seen 09/10/2018 for hyperlipidemia. Please advise. Thank you

## 2018-10-01 MED ORDER — ESCITALOPRAM OXALATE 20 MG PO TABS: 20.0000 mg | ORAL_TABLET | Freq: Every day | ORAL | 5 refills | Status: DC

## 2018-10-01 NOTE — Telephone Encounter (Signed)
Ok six mo worth one dail

## 2018-10-01 NOTE — Addendum Note (Signed)
Addended by: Vicente Males on: 10/01/2018 11:39 AM   Modules accepted: Orders

## 2018-10-01 NOTE — Telephone Encounter (Signed)
Medication sent in. 

## 2018-10-06 ENCOUNTER — Other Ambulatory Visit: Payer: Self-pay | Admitting: Family Medicine

## 2018-10-12 ENCOUNTER — Encounter: Payer: Self-pay | Admitting: Family Medicine

## 2018-12-23 ENCOUNTER — Other Ambulatory Visit: Payer: Self-pay | Admitting: Family Medicine

## 2019-02-11 ENCOUNTER — Encounter: Payer: Self-pay | Admitting: Family Medicine

## 2019-07-01 ENCOUNTER — Other Ambulatory Visit: Payer: Self-pay | Admitting: Family Medicine

## 2019-07-01 DIAGNOSIS — I63312 Cerebral infarction due to thrombosis of left middle cerebral artery: Secondary | ICD-10-CM

## 2019-07-01 DIAGNOSIS — F321 Major depressive disorder, single episode, moderate: Secondary | ICD-10-CM

## 2019-07-01 NOTE — Telephone Encounter (Signed)
Pt needs visit for her 75month follow up, last appt in 9/20.- try to get appt in next 1 month.  pls order cbc, cmp, a1c, lipids.   Thx.   Dr. Darene Lamer

## 2019-07-02 ENCOUNTER — Other Ambulatory Visit: Payer: Self-pay | Admitting: *Deleted

## 2019-07-02 DIAGNOSIS — E119 Type 2 diabetes mellitus without complications: Secondary | ICD-10-CM

## 2019-07-02 DIAGNOSIS — E785 Hyperlipidemia, unspecified: Secondary | ICD-10-CM

## 2019-07-02 DIAGNOSIS — I10 Essential (primary) hypertension: Secondary | ICD-10-CM

## 2019-07-02 DIAGNOSIS — Z79899 Other long term (current) drug therapy: Secondary | ICD-10-CM

## 2019-07-02 MED ORDER — LISINOPRIL-HYDROCHLOROTHIAZIDE 20-12.5 MG PO TABS: 1.0000 | ORAL_TABLET | Freq: Every day | ORAL | 0 refills | Status: DC

## 2019-07-02 NOTE — Addendum Note (Signed)
Addended by: Carmelina Noun on: 07/02/2019 04:23 PM   Modules accepted: Orders

## 2019-07-02 NOTE — Telephone Encounter (Signed)
Orders put in and pt was notified.  

## 2019-07-02 NOTE — Telephone Encounter (Signed)
Ok, thx, pls order those labs for her to get prior to the appt. Thx. Dr. Darene Lamer

## 2019-07-02 NOTE — Telephone Encounter (Signed)
Scheduled 7/16

## 2019-07-02 NOTE — Telephone Encounter (Signed)
When is her appt? Thx, Dr. Darene Lamer

## 2019-07-02 NOTE — Telephone Encounter (Signed)
Appt 7/16. Would like a refill and last labs were 09/12/18 lipid, liver, bmp, a1c, mircoalb urine

## 2019-07-21 DIAGNOSIS — Z79899 Other long term (current) drug therapy: Secondary | ICD-10-CM | POA: Diagnosis not present

## 2019-07-21 DIAGNOSIS — I10 Essential (primary) hypertension: Secondary | ICD-10-CM | POA: Diagnosis not present

## 2019-07-21 DIAGNOSIS — E785 Hyperlipidemia, unspecified: Secondary | ICD-10-CM | POA: Diagnosis not present

## 2019-07-21 DIAGNOSIS — E119 Type 2 diabetes mellitus without complications: Secondary | ICD-10-CM | POA: Diagnosis not present

## 2019-07-22 LAB — LIPID PANEL
Chol/HDL Ratio: 4.2 ratio (ref 0.0–4.4)
Cholesterol, Total: 198 mg/dL (ref 100–199)
HDL: 47 mg/dL (ref >39)
LDL Chol Calc (NIH): 112 mg/dL — ABNORMAL HIGH (ref 0–99)
Triglycerides: 228 mg/dL — ABNORMAL HIGH (ref 0–149)
VLDL Cholesterol Cal: 39 mg/dL (ref 5–40)

## 2019-07-22 LAB — BASIC METABOLIC PANEL
BUN/Creatinine Ratio: 11 (ref 9–23)
BUN: 9 mg/dL (ref 6–24)
CO2: 24 mmol/L (ref 20–29)
Calcium: 9.2 mg/dL (ref 8.7–10.2)
Chloride: 97 mmol/L (ref 96–106)
Creatinine, Ser: 0.81 mg/dL (ref 0.57–1.00)
GFR calc Af Amer: 93 mL/min/{1.73_m2} (ref >59)
GFR calc non Af Amer: 81 mL/min/{1.73_m2} (ref >59)
Glucose: 146 mg/dL — ABNORMAL HIGH (ref 65–99)
Potassium: 5.1 mmol/L (ref 3.5–5.2)
Sodium: 135 mmol/L (ref 134–144)

## 2019-07-22 LAB — MICROALBUMIN / CREATININE URINE RATIO
Creatinine, Urine: 70.9 mg/dL
Microalb/Creat Ratio: 110 mg/g creat — ABNORMAL HIGH (ref 0–29)
Microalbumin, Urine: 77.8 ug/mL

## 2019-07-22 LAB — HEPATIC FUNCTION PANEL
ALT: 25 IU/L (ref 0–32)
AST: 20 IU/L (ref 0–40)
Albumin: 4.1 g/dL (ref 3.8–4.9)
Alkaline Phosphatase: 98 IU/L (ref 48–121)
Bilirubin Total: 0.3 mg/dL (ref 0.0–1.2)
Bilirubin, Direct: 0.1 mg/dL (ref 0.00–0.40)
Total Protein: 6.5 g/dL (ref 6.0–8.5)

## 2019-07-22 LAB — HEMOGLOBIN A1C
Est. average glucose Bld gHb Est-mCnc: 131 mg/dL
Hgb A1c MFr Bld: 6.2 % — ABNORMAL HIGH (ref 4.8–5.6)

## 2019-07-24 ENCOUNTER — Other Ambulatory Visit: Payer: Self-pay

## 2019-07-24 ENCOUNTER — Encounter: Payer: Self-pay | Admitting: Family Medicine

## 2019-07-24 ENCOUNTER — Ambulatory Visit (INDEPENDENT_AMBULATORY_CARE_PROVIDER_SITE_OTHER): Payer: Medicare HMO | Admitting: Family Medicine

## 2019-07-24 VITALS — BP 118/74 | HR 102 | Temp 98.0°F | Ht 65.0 in | Wt 257.0 lb

## 2019-07-24 DIAGNOSIS — E119 Type 2 diabetes mellitus without complications: Secondary | ICD-10-CM | POA: Diagnosis not present

## 2019-07-24 DIAGNOSIS — E785 Hyperlipidemia, unspecified: Secondary | ICD-10-CM

## 2019-07-24 DIAGNOSIS — I10 Essential (primary) hypertension: Secondary | ICD-10-CM

## 2019-07-24 DIAGNOSIS — R1030 Lower abdominal pain, unspecified: Secondary | ICD-10-CM | POA: Diagnosis not present

## 2019-07-24 NOTE — Progress Notes (Signed)
Patient ID: Shannon Price, female    DOB: [birth year], 59 y.o.   MRN: [id number]   Chief Complaint  Patient presents with  . Diabetes   Subjective:    HPI  DM2-med check up. Pt states sugar has been running good. a1c 3 days ago 6.2. Last eye exam over one year ago. Insurance does not cover eye exams. Has same pair of glasses for about 8 years.  Does not see a foot doctor.   Abdominal pain for a couple of years. Pain in mid abdomen. Feels like a cramping pain that comes and goes. Has not tried any treatments.  Not seen by gi in past. Had colonoscopy. Due next summer 2022 for repeat colonosocpy. Had some polyps. Pain in upper pain. No pain to eat. Using lots of bc powders- 2x per week. Used to take daily, 1 yr ago. No surgeries on abdomen.  Medical History Shannon Price has a past medical history of Anxiety, CVA (cerebral vascular accident) ([place]) (07/2013), Headache(784.0), High cholesterol, Hypertension, and Type II diabetes mellitus ([place]).   Outpatient Encounter Medications as of 07/24/2019  Medication Sig  . acetaminophen (TYLENOL) 500 MG tablet Take 500 mg by mouth every 6 (six) hours as needed for mild pain or moderate pain.  Marland Kitchen aspirin 325 MG tablet Take 1 tablet (325 mg total) by mouth daily.  Marland Kitchen atorvastatin (LIPITOR) 20 MG tablet TAKE 1 TABLET (20 MG TOTAL) BY MOUTH DAILY.  . clonazePAM (KLONOPIN) 0.5 MG tablet Take one qhs prn sleep  . escitalopram (LEXAPRO) 20 MG tablet TAKE 1 TABLET EVERY DAY  . guaifenesin (ROBITUSSIN) 100 MG/5ML syrup Take 200 mg by mouth 4 (four) times daily as needed for cough.  Marland Kitchen lisinopril-hydrochlorothiazide (ZESTORETIC) 20-12.5 MG tablet Take 1 tablet by mouth daily.  . metFORMIN (GLUCOPHAGE) 1000 MG tablet Take 1 tablet (1,000 mg total) by mouth 2 (two) times daily.  Marland Kitchen Phenyleph-CPM-DM-APAP (ALKA-SELTZER PLUS COLD & COUGH) 05-09-08-325 MG CAPS Take 1-2 capsules by mouth daily as needed (for cold relief).  . verapamil (CALAN-SR) 240 MG CR tablet TAKE 1  TABLET EVERY MORNING   No facility-administered encounter medications on file as of 07/24/2019.     Review of Systems  Constitutional: Negative for chills and fever.  HENT: Negative for congestion, rhinorrhea and sore throat.   Respiratory: Negative for cough, shortness of breath and wheezing.   Cardiovascular: Negative for chest pain and leg swelling.  Gastrointestinal: Positive for abdominal pain (intermittent). Negative for diarrhea, nausea and vomiting.  Genitourinary: Negative for dysuria and frequency.  Musculoskeletal: Negative for arthralgias and back pain.  Skin: Negative for rash.  Neurological: Negative for dizziness, weakness and headaches.     Vitals BP 118/74   Pulse (!) 102   Temp 98 F (36.7 C)   Ht 5\' 5"  (1.651 m)   Wt 257 lb (116.6 kg)   SpO2 98%   BMI 42.77 kg/m   Objective:   Physical Exam Vitals and nursing note reviewed.  Constitutional:      Appearance: Normal appearance.  HENT:     Head: Normocephalic and atraumatic.     Nose: Nose normal.     Mouth/Throat:     Mouth: Mucous membranes are moist.     Pharynx: Oropharynx is clear.  Eyes:     Extraocular Movements: Extraocular movements intact.     Conjunctiva/sclera: Conjunctivae normal.     Pupils: Pupils are equal, round, and reactive to light.  Cardiovascular:     Rate and Rhythm: Normal rate  and regular rhythm.     Pulses: Normal pulses.     Heart sounds: Normal heart sounds.  Pulmonary:     Effort: Pulmonary effort is normal.     Breath sounds: Normal breath sounds. No wheezing, rhonchi or rales.  Abdominal:     General: Abdomen is flat. Bowel sounds are normal. There is no distension.     Palpations: There is no mass.     Tenderness: There is abdominal tenderness (lower abd). There is no guarding or rebound.     Hernia: No hernia is present.  Musculoskeletal:        General: Normal range of motion.     Right lower leg: No edema.     Left lower leg: No edema.  Skin:    General:  Skin is warm and dry.     Findings: No lesion or rash.  Neurological:     General: No focal deficit present.     Mental Status: She is alert and oriented to person, place, and time.  Psychiatric:        Mood and Affect: Mood normal.        Behavior: Behavior normal.      Assessment and Plan   1. Diabetes mellitus without complication ([place])  2. Lower abdominal pain - CBC With Differential - CT Abdomen Pelvis W Contrast; Future - Lipase  3. Hyperlipidemia LDL goal <100  4. Essential hypertension, benign   Lower abd pain- chronic.  Will order ct abd. Labs ordered.  Tylenol/ibuprofen prn pain.  Decrease the BC powders.   Dm2-stable, controlled. pt to work on dec carb intake. a1c slight increase was 6.0 and now at 6.2.  hld- overall controlled.  LDL slight elevated.  Cont to monitor.  Dec cholesterol in diet.  Inc in exercising.  Cont with lipitor.  htn- controlled, cont meds.  F/u 6 mo or prn.  Will call with results of ct and labs.

## 2019-07-29 ENCOUNTER — Encounter: Payer: Self-pay | Admitting: Family Medicine

## 2019-08-18 ENCOUNTER — Ambulatory Visit: Payer: Medicare HMO | Admitting: Family Medicine

## 2019-08-19 ENCOUNTER — Ambulatory Visit (HOSPITAL_COMMUNITY): Admission: RE | Admit: 2019-08-19 | Payer: Medicare HMO | Source: Ambulatory Visit

## 2019-08-24 ENCOUNTER — Ambulatory Visit (HOSPITAL_COMMUNITY)
Admission: RE | Admit: 2019-08-24 | Discharge: 2019-08-24 | Disposition: A | Discharge status: Home / Self Care | Payer: Medicare HMO | Source: Ambulatory Visit | Attending: Family Medicine | Admitting: Family Medicine

## 2019-08-24 ENCOUNTER — Other Ambulatory Visit: Payer: Self-pay

## 2019-08-24 DIAGNOSIS — R1030 Lower abdominal pain, unspecified: Secondary | ICD-10-CM | POA: Insufficient documentation

## 2019-08-24 DIAGNOSIS — M47816 Spondylosis without myelopathy or radiculopathy, lumbar region: Secondary | ICD-10-CM | POA: Diagnosis not present

## 2019-08-24 DIAGNOSIS — N281 Cyst of kidney, acquired: Secondary | ICD-10-CM | POA: Diagnosis not present

## 2019-08-24 DIAGNOSIS — I700 Atherosclerosis of aorta: Secondary | ICD-10-CM | POA: Diagnosis not present

## 2019-08-24 DIAGNOSIS — N3090 Cystitis, unspecified without hematuria: Secondary | ICD-10-CM | POA: Diagnosis not present

## 2019-08-24 LAB — POCT I-STAT CREATININE: Creatinine, Ser: 0.8 mg/dL (ref 0.44–1.00)

## 2019-08-24 MED ORDER — IOHEXOL 300 MG/ML  SOLN
100.0000 mL | Freq: Once | INTRAMUSCULAR | Status: AC | PRN
  Administered 2019-08-24: 100 mL via INTRAVENOUS

## 2019-08-31 ENCOUNTER — Other Ambulatory Visit: Payer: Self-pay

## 2019-08-31 ENCOUNTER — Ambulatory Visit (INDEPENDENT_AMBULATORY_CARE_PROVIDER_SITE_OTHER): Payer: Medicare HMO | Admitting: Family Medicine

## 2019-08-31 ENCOUNTER — Encounter: Payer: Self-pay | Admitting: Family Medicine

## 2019-08-31 VITALS — BP 136/84 | HR 98 | Temp 98.0°F | Ht 65.0 in | Wt 260.2 lb

## 2019-08-31 DIAGNOSIS — R1084 Generalized abdominal pain: Secondary | ICD-10-CM | POA: Diagnosis not present

## 2019-08-31 DIAGNOSIS — R110 Nausea: Secondary | ICD-10-CM | POA: Diagnosis not present

## 2019-08-31 DIAGNOSIS — E119 Type 2 diabetes mellitus without complications: Secondary | ICD-10-CM

## 2019-08-31 LAB — POCT URINALYSIS DIPSTICK
Spec Grav, UA: 1.01 (ref 1.010–1.025)
pH, UA: 6 (ref 5.0–8.0)

## 2019-08-31 MED ORDER — ONDANSETRON 4 MG PO TBDP: 4.0000 mg | ORAL_TABLET | Freq: Three times a day (TID) | ORAL | 0 refills | Status: DC | PRN

## 2019-08-31 NOTE — Progress Notes (Signed)
Patient ID: Shannon Price, female    DOB: 1960/11/11, 59 y.o.   MRN: [id number]   Chief Complaint  Patient presents with  . Discussion   Subjective:    HPI Pt here to discuss CT abdomen pelvis that was completed 08/24/19. Pt states she is still having cramps. Not an every day thing but a few times a week. Pt having a lot of nausea. No vomiting. Some constipation.   Pt stating this stomach discomfort has been going on for a couple of years.  Almost normal bc going on for so long.  Feeling like she's going to vomit, and getting mouthwatering, but doesn't actually vomit. Happening during the day. Nothing brings it on. Last few hours or mins. No blood in stool or black stool.  Has appetite.     Medical History Honesty has a past medical history of Anxiety, CVA (cerebral vascular accident) ([place]) (07/2013), Headache(784.0), High cholesterol, Hypertension, and Type II diabetes mellitus ([place]).   Outpatient Encounter Medications as of 08/31/2019  Medication Sig  . acetaminophen (TYLENOL) 500 MG tablet Take 500 mg by mouth every 6 (six) hours as needed for mild pain or moderate pain.  Marland Kitchen aspirin 325 MG tablet Take 1 tablet (325 mg total) by mouth daily.  . clonazePAM (KLONOPIN) 0.5 MG tablet Take one qhs prn sleep  . guaifenesin (ROBITUSSIN) 100 MG/5ML syrup Take 200 mg by mouth 4 (four) times daily as needed for cough.  Marland Kitchen lisinopril-hydrochlorothiazide (ZESTORETIC) 20-12.5 MG tablet Take 1 tablet by mouth daily.  . metFORMIN (GLUCOPHAGE) 1000 MG tablet Take 1 tablet (1,000 mg total) by mouth 2 (two) times daily.  Marland Kitchen Phenyleph-CPM-DM-APAP (ALKA-SELTZER PLUS COLD & COUGH) 05-09-08-325 MG CAPS Take 1-2 capsules by mouth daily as needed (for cold relief).  . [DISCONTINUED] atorvastatin (LIPITOR) 20 MG tablet TAKE 1 TABLET (20 MG TOTAL) BY MOUTH DAILY.  . [DISCONTINUED] escitalopram (LEXAPRO) 20 MG tablet TAKE 1 TABLET EVERY DAY  . [DISCONTINUED] verapamil (CALAN-SR) 240 MG CR tablet TAKE 1 TABLET  EVERY MORNING  . ondansetron (ZOFRAN ODT) 4 MG disintegrating tablet Take 1 tablet (4 mg total) by mouth every 8 (eight) hours as needed for nausea or vomiting.   No facility-administered encounter medications on file as of 08/31/2019.     Review of Systems  Constitutional: Negative for chills and fever.  HENT: Negative for congestion, rhinorrhea and sore throat.   Respiratory: Negative for cough, shortness of breath and wheezing.   Cardiovascular: Negative for chest pain and leg swelling.  Gastrointestinal: Positive for abdominal pain and nausea. Negative for diarrhea and vomiting.  Genitourinary: Negative for dysuria and frequency.  Musculoskeletal: Negative for arthralgias and back pain.  Skin: Negative for rash.  Neurological: Negative for dizziness, weakness and headaches.     Vitals BP 136/84   Pulse 98   Temp 98 F (36.7 C)   Ht 5\' 5"  (1.651 m)   Wt 260 lb 3.2 oz (118 kg)   SpO2 98%   BMI 43.30 kg/m   Objective:   Physical Exam Vitals and nursing note reviewed.  Constitutional:      Appearance: Normal appearance.  HENT:     Head: Normocephalic and atraumatic.     Nose: Nose normal.     Mouth/Throat:     Mouth: Mucous membranes are moist.     Pharynx: Oropharynx is clear.  Eyes:     Extraocular Movements: Extraocular movements intact.     Conjunctiva/sclera: Conjunctivae normal.     Pupils:  Pupils are equal, round, and reactive to light.  Cardiovascular:     Rate and Rhythm: Regular rhythm. Tachycardia present.     Pulses: Normal pulses.     Heart sounds: Normal heart sounds.  Pulmonary:     Effort: Pulmonary effort is normal.     Breath sounds: Normal breath sounds. No wheezing, rhonchi or rales.  Musculoskeletal:        General: Normal range of motion.     Right lower leg: No edema.     Left lower leg: No edema.  Skin:    General: Skin is warm and dry.     Findings: No lesion or rash.  Neurological:     General: No focal deficit present.      Mental Status: She is alert and oriented to person, place, and time.  Psychiatric:        Mood and Affect: Mood normal.        Behavior: Behavior normal.      Assessment and Plan   1. Chronic nausea - ondansetron (ZOFRAN ODT) 4 MG disintegrating tablet; Take 1 tablet (4 mg total) by mouth every 8 (eight) hours as needed for nausea or vomiting.  Dispense: 30 tablet; Refill: 0  2. Controlled type 2 diabetes mellitus without complication, without long-term current use of insulin ([place])  3. Generalized abdominal pain - POCT Urinalysis Dipstick - Urine Culture - Specimen status report   Initially HR at 111 and on recheck 98 bpm. Will cont to monitor heart rate.  Pt is asymptomatic with elevated hr.  Will check urine culture and order abx if needed.  F/u prn.

## 2019-09-02 ENCOUNTER — Other Ambulatory Visit: Payer: Self-pay | Admitting: Family Medicine

## 2019-09-02 DIAGNOSIS — F321 Major depressive disorder, single episode, moderate: Secondary | ICD-10-CM

## 2019-09-02 DIAGNOSIS — I63312 Cerebral infarction due to thrombosis of left middle cerebral artery: Secondary | ICD-10-CM

## 2019-09-04 ENCOUNTER — Telehealth: Payer: Self-pay | Admitting: Family Medicine

## 2019-09-04 ENCOUNTER — Encounter: Payer: Self-pay | Admitting: Family Medicine

## 2019-09-04 MED ORDER — CEPHALEXIN 500 MG PO CAPS: 500.0000 mg | ORAL_CAPSULE | Freq: Two times a day (BID) | ORAL | 0 refills | Status: DC

## 2019-09-04 NOTE — Telephone Encounter (Signed)
Patient notified of results.

## 2019-09-06 LAB — URINE CULTURE

## 2019-09-06 LAB — SPECIMEN STATUS REPORT

## 2019-09-07 ENCOUNTER — Other Ambulatory Visit (HOSPITAL_COMMUNITY): Payer: Self-pay | Admitting: Family Medicine

## 2019-09-07 DIAGNOSIS — Z1231 Encounter for screening mammogram for malignant neoplasm of breast: Secondary | ICD-10-CM

## 2019-09-09 DIAGNOSIS — C801 Malignant (primary) neoplasm, unspecified: Secondary | ICD-10-CM

## 2019-09-09 HISTORY — DX: Malignant (primary) neoplasm, unspecified: C80.1

## 2019-09-18 ENCOUNTER — Ambulatory Visit (HOSPITAL_COMMUNITY)
Admission: RE | Admit: 2019-09-18 | Discharge: 2019-09-18 | Disposition: A | Discharge status: Home / Self Care | Payer: Medicare HMO | Source: Ambulatory Visit | Attending: Family Medicine | Admitting: Family Medicine

## 2019-09-18 ENCOUNTER — Other Ambulatory Visit: Payer: Self-pay

## 2019-09-18 DIAGNOSIS — Z1231 Encounter for screening mammogram for malignant neoplasm of breast: Secondary | ICD-10-CM | POA: Insufficient documentation

## 2019-09-22 ENCOUNTER — Other Ambulatory Visit (HOSPITAL_COMMUNITY): Payer: Self-pay | Admitting: Family Medicine

## 2019-09-22 DIAGNOSIS — R928 Other abnormal and inconclusive findings on diagnostic imaging of breast: Secondary | ICD-10-CM

## 2019-09-25 ENCOUNTER — Other Ambulatory Visit (HOSPITAL_COMMUNITY): Payer: Self-pay | Admitting: Family Medicine

## 2019-09-25 ENCOUNTER — Other Ambulatory Visit: Payer: Self-pay

## 2019-09-25 ENCOUNTER — Encounter: Payer: Self-pay | Admitting: Family Medicine

## 2019-09-25 ENCOUNTER — Ambulatory Visit (HOSPITAL_COMMUNITY)
Admission: RE | Admit: 2019-09-25 | Discharge: 2019-09-25 | Disposition: A | Discharge status: Home / Self Care | Payer: Medicare HMO | Source: Ambulatory Visit | Attending: Family Medicine | Admitting: Family Medicine

## 2019-09-25 DIAGNOSIS — N6311 Unspecified lump in the right breast, upper outer quadrant: Secondary | ICD-10-CM | POA: Diagnosis not present

## 2019-09-25 DIAGNOSIS — R928 Other abnormal and inconclusive findings on diagnostic imaging of breast: Secondary | ICD-10-CM | POA: Diagnosis not present

## 2019-09-25 DIAGNOSIS — N6312 Unspecified lump in the right breast, upper inner quadrant: Secondary | ICD-10-CM | POA: Diagnosis not present

## 2019-09-28 ENCOUNTER — Other Ambulatory Visit: Payer: Self-pay | Admitting: Family Medicine

## 2019-09-29 ENCOUNTER — Other Ambulatory Visit: Payer: Self-pay

## 2019-09-29 ENCOUNTER — Other Ambulatory Visit (HOSPITAL_COMMUNITY): Payer: Self-pay | Admitting: Family Medicine

## 2019-09-29 ENCOUNTER — Ambulatory Visit (HOSPITAL_COMMUNITY)
Admission: RE | Admit: 2019-09-29 | Discharge: 2019-09-29 | Disposition: A | Discharge status: Home / Self Care | Payer: Medicare HMO | Source: Ambulatory Visit | Attending: Family Medicine | Admitting: Family Medicine

## 2019-09-29 DIAGNOSIS — C50811 Malignant neoplasm of overlapping sites of right female breast: Secondary | ICD-10-CM | POA: Diagnosis not present

## 2019-09-29 DIAGNOSIS — Z170 Estrogen receptor positive status [ER+]: Secondary | ICD-10-CM | POA: Diagnosis not present

## 2019-09-29 DIAGNOSIS — R928 Other abnormal and inconclusive findings on diagnostic imaging of breast: Secondary | ICD-10-CM

## 2019-09-29 DIAGNOSIS — N6315 Unspecified lump in the right breast, overlapping quadrants: Secondary | ICD-10-CM | POA: Diagnosis not present

## 2019-09-29 MED ORDER — SODIUM BICARBONATE 4.2 % IV SOLN
INTRAVENOUS | Status: AC
  Filled 2019-09-29: qty 10

## 2019-09-29 MED ORDER — LIDOCAINE HCL (PF) 2 % IJ SOLN
INTRAMUSCULAR | Status: AC
  Filled 2019-09-29: qty 10

## 2019-10-01 ENCOUNTER — Encounter: Payer: Self-pay | Admitting: Family Medicine

## 2019-10-01 DIAGNOSIS — Z170 Estrogen receptor positive status [ER+]: Secondary | ICD-10-CM | POA: Insufficient documentation

## 2019-10-01 LAB — SURGICAL PATHOLOGY

## 2019-10-09 ENCOUNTER — Other Ambulatory Visit: Payer: Self-pay | Admitting: Surgery

## 2019-10-09 ENCOUNTER — Ambulatory Visit: Payer: Self-pay | Admitting: Surgery

## 2019-10-09 DIAGNOSIS — N6313 Unspecified lump in the right breast, lower outer quadrant: Secondary | ICD-10-CM

## 2019-10-09 DIAGNOSIS — C50911 Malignant neoplasm of unspecified site of right female breast: Secondary | ICD-10-CM | POA: Diagnosis not present

## 2019-10-09 NOTE — H&P (Signed)
History of Present Illness Shannon [name]. Shannon Dickerman MD; 10/09/2019 12:51 PM) The patient is a 59 year old female who presents with breast cancer. Referred by Dr. Elvia Collum for right invasive ductal carcinoma   This is a 59 year old female who presents after recent diagnosis of right breast cancer. She had a routine screening mammogram on 09/18/19. Her previous screening mammogram was in 2019. She did not have a mammogram in 2020 due to Covid. The screening mammogram showed possible right breast mass. She underwent diagnostic mammogram and ultrasound on 9/17 that showed mass in the right breast at 12:00 4 cm from the nipple measuring 1.5 x 1.1 x 0.8 cm. At 9:00 4 cm from the nipple there is a second mass measuring 0.7 x 0.2 x 0.5 cm. 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 The second biopsy was not performed and has not yet been scheduled. Ultrasound showed no enlarged lymph nodes in the right axilla.  The patient denies any previous breast problems and no family history of breast cancer.  She does smoke 2 packs a day.  She wants to have all of her oncology care performed here in [place].   Problem List/Past Medical Shannon Key K. 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 Cephalexin (500MG Capsule, Oral) Active. Atorvastatin  Calcium (20MG Tablet, Oral) Active. Escitalopram Oxalate (20MG Tablet, Oral) Active. Verapamil HCl ER (240MG Tablet ER, Oral) Active. Ondansetron (4MG Tablet Disint, Oral) Active. metFORMIN HCl (1000MG Tablet, Oral) Active. clonazePAM (0.5MG Tablet, Oral) Active. Aspirin (81MG Tablet DR, Oral) Active. Medications Reconciled  Social History Shannon [name], Shannon [name]; 10/09/2019 11:38 AM) Alcohol use Occasional alcohol use. Caffeine use Carbonated beverages. No drug use Tobacco use Current every day smoker.  Family History Shannon [name], Shannon [name]; 10/09/2019 11:38 AM) Diabetes Mellitus Father. Seizure disorder Son.  Pregnancy / Birth History Shannon [name], Shannon [name]; 10/09/2019 11:38 AM) Age at menarche 40 years. Age of menopause <47 Gravida 2 Irregular periods Maternal age <15 Para 2  Other Problems Shannon [name]. Shannon Sculley, MD; 10/09/2019 12:51 PM) Cerebrovascular Accident Depression Diabetes Mellitus High blood pressure Hypercholesterolemia     Review of Systems Shannon [name]; 10/09/2019 11:39 AM) General Not Present- Appetite Loss, Chills, Fatigue, Fever, Night Sweats, Weight Gain and Weight Loss. Skin Not Present- Change in Wart/Mole, Dryness, Hives, Jaundice, New Lesions, Non-Healing Wounds, Rash and Ulcer. HEENT Not Present- Earache, Hearing Loss, Hoarseness, Nose Bleed, Oral Ulcers, Ringing in the Ears, Seasonal Allergies, Sinus Pain, Sore Throat, Visual Disturbances, Wears glasses/contact lenses and Yellow Eyes. Respiratory Not Present- Bloody sputum, Chronic Cough, Difficulty Breathing, Snoring and Wheezing. Breast Not Present- Breast Mass, Breast Pain, Nipple Discharge and Skin Changes. Cardiovascular Not Present- Chest Pain, Difficulty Breathing Lying Down, Leg Cramps, Palpitations, Rapid Heart Rate, Shortness of Breath and Swelling of Extremities. Gastrointestinal Not Present- Abdominal Pain, Bloating, Bloody Stool, Change in Bowel Habits, Chronic diarrhea,  Constipation, Difficulty Swallowing, Excessive gas, Gets full quickly at meals, Hemorrhoids, Indigestion, Nausea, Rectal Pain and Vomiting. Female Genitourinary Not Present- Frequency,  Nocturia, Painful Urination, Pelvic Pain and Urgency. Musculoskeletal Not Present- Back Pain, Joint Pain, Joint Stiffness, Muscle Pain, Muscle Weakness and Swelling of Extremities. Neurological Not Present- Decreased Memory, Fainting, Headaches, Numbness, Seizures, Tingling, Tremor, Trouble walking and Weakness. Psychiatric Not Present- Anxiety, Bipolar, Change in Sleep Pattern, Depression, Fearful and Frequent crying. Endocrine Not Present- Cold Intolerance, Excessive Hunger, Hair Changes, Heat Intolerance, Hot flashes and New Diabetes. Hematology Not Present- Blood Thinners, Easy Bruising, Excessive bleeding, Gland problems, HIV and Persistent Infections.  Vitals Shannon [name]; 10/09/2019 11:41 AM) 10/09/2019 11:41 AM Weight: 266.25 lb Height: 66in Body Surface Area: 2.26 m Body Mass Index: 42.97 kg/m  Temp.: 98.47F  Pulse: 111 (Regular)  P.OX: 97% (Room air) BP: 126/86(Sitting, Left Arm, Standard)        Physical Exam Shannon Key K. Shannon Schneller MD; 10/09/2019 12:52 PM)  The physical exam findings are as follows: Note:Constitutional: WDWN in NAD, conversant, no obvious deformities; resting comfortably Eyes: Pupils equal, round; sclera anicteric; moist conjunctiva; no lid lag HENT: Oral mucosa moist; good dentition Neck: No masses palpated, trachea midline; no thyromegaly Lungs: CTA bilaterally; normal respiratory effort Breasts: Symmetric except for significant bruising in the right breast that seems to be resolving Left breast shows no palpable masses or lymphadenopathy. No nipple changes or discharge Right breast shows a firm palpable 3 cm mass at 12:00 approximately 4 cm from the nipple. The patient states that this got larger after her biopsy and is likely postbiopsy hematoma. No palpable  masses in the remainder of the breast. No axillary lymphadenopathy. No nipple changes or discharge CV: Regular rate and rhythm; no murmurs; extremities well-perfused with no edema Abd: +bowel sounds, soft, non-tender, no palpable organomegaly; no palpable hernias Musc: Normal gait; no apparent clubbing or cyanosis in extremities Lymphatic: No palpable cervical or axillary lymphadenopathy Skin: Warm, dry; no sign of jaundice Psychiatric - alert and oriented x 4; calm mood and affect    Assessment & Plan Shannon Key K. 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 The surgical procedure has been discussed with the patient. Potential risks, benefits, alternative treatments, and expected outcomes have been explained. All of the patient's questions at this time have been answered. The likelihood of reaching the patient's treatment goal is good. The patient understand the proposed surgical procedure and wishes to proceed. Note:We will try to expedite the biopsy of the mass at 9:00.  We discussed her options for surgical treatment including mastectomy versus breast conserving therapy. After thorough discussion, we have opted for breast conserving therapy. We will plan right radioactive seed localized lumpectomy 2 to include the mass at 9:00. This plan may change depending on the pathology report from the core biopsy. We'll also plan a right axillary sentinel lymph node biopsy with blue dye injection. Oncology and radiation oncology referrals have been placed.  Shannon [name]. [name], MD, [facility]   10/09/2019 12:53 PM

## 2019-10-15 ENCOUNTER — Telehealth: Payer: Self-pay | Admitting: *Deleted

## 2019-10-15 ENCOUNTER — Other Ambulatory Visit: Payer: Self-pay | Admitting: Surgery

## 2019-10-15 DIAGNOSIS — C50911 Malignant neoplasm of unspecified site of right female breast: Secondary | ICD-10-CM

## 2019-10-15 NOTE — Telephone Encounter (Signed)
LVM for call back

## 2019-10-16 ENCOUNTER — Telehealth: Payer: Self-pay | Admitting: *Deleted

## 2019-10-16 NOTE — Telephone Encounter (Signed)
LVM for call back

## 2019-10-19 ENCOUNTER — Ambulatory Visit
Admission: RE | Admit: 2019-10-19 | Discharge: 2019-10-19 | Disposition: A | Discharge status: Home / Self Care | Payer: Medicare HMO | Source: Ambulatory Visit | Attending: Surgery | Admitting: Surgery

## 2019-10-19 ENCOUNTER — Telehealth: Payer: Self-pay | Admitting: Hematology and Oncology

## 2019-10-19 ENCOUNTER — Other Ambulatory Visit: Payer: Self-pay

## 2019-10-19 DIAGNOSIS — D241 Benign neoplasm of right breast: Secondary | ICD-10-CM | POA: Diagnosis not present

## 2019-10-19 DIAGNOSIS — N6313 Unspecified lump in the right breast, lower outer quadrant: Secondary | ICD-10-CM

## 2019-10-19 DIAGNOSIS — C50411 Malignant neoplasm of upper-outer quadrant of right female breast: Secondary | ICD-10-CM | POA: Diagnosis not present

## 2019-10-19 NOTE — Telephone Encounter (Signed)
Received a new pt referral from Dr. [name] for new dx of breast cancer. Pt has been cld and scheduled to see Dr. Lindi Adie on 10/13 at 230pm.

## 2019-10-20 NOTE — Progress Notes (Signed)
Location of Breast Cancer: Invasive ductal carcinoma of RIGHT breast, estrogen receptor positive  Histology per Pathology Report:  09/29/2019 (definitive pathology pending until lumpectomy on 10/30/2019) FINAL MICROSCOPIC DIAGNOSIS:  A. BREAST, RIGHT 12:00, BIOPSY:  - Invasive ductal carcinoma with extracellular mucin  Receptor Status: ER(90%), PR (95%), Her2-neu (negative), Ki-67(15%)  Did patient present with symptoms (if so, please note symptoms) or was this found on screening mammography?:    Past/Anticipated interventions by surgeon, if any: Scheduled for right breast lumpectomy x2 with radioactive seed and right axillary sentinel lymph node biopsy by Dr. Donnie Mesa on 10/30/2019  Past/Anticipated interventions by medical oncology, if any:  Meeting with Dr. Nicholas Lose later today  Lymphedema issues, if any:  Patient denies   Pain issues, if any:  Reports soreness to biopsy sites and ongoing headaches that she manages with [facility] powder or OTC Tylenol  SAFETY ISSUES:  Prior radiation? No  Pacemaker/ICD? No  Possible current pregnancy? No--postmenopausal  Is the patient on methotrexate? No  Current Complaints / other details:   Smokes ~2 packs of cigarettes daily

## 2019-10-20 NOTE — Progress Notes (Signed)
Radiation Oncology         [phone] ________________________________  Initial Outpatient Consultation  Name: Shannon Price MRN: [id number]  Date: 10/21/2019  DOB: [birth year]  [id number], Malena M, DO  Donnie Mesa, MD   REFERRING PHYSICIAN: Donnie Mesa, MD  DIAGNOSIS:    ICD-10-CM   1. Malignant neoplasm of upper-outer quadrant of right breast in female, estrogen receptor positive ([place])  C50.411 Ambulatory referral to Social Work   Z17.0     Cancer Staging Malignant neoplasm of upper-outer quadrant of right breast in female, estrogen receptor positive ([place]) Staging form: Breast, AJCC 8th Edition - Clinical stage from 10/21/2019: Stage IA (cT1c, cN0, cM0, G1, ER+, PR+, HER2-) - Signed by Nicholas Lose, MD on 10/21/2019    CHIEF COMPLAINT: Here to discuss management of right breast cancer  HISTORY OF PRESENT ILLNESS::[name] is a 59 y.o. female who presented with right breast abnormality on the following imaging: bilateral screening mammogram on the date of 09/18/2019. No symptoms were reported at that time. Ultrasound of breast on 09/25/2019 revealed an indeterminate 1.5 cm mass in the right breast at the 12 o'clock position. It also showed an additional smaller 0.7 cm mass in the outer right breast. No right axillary adenopathy. Biopsy of the right breast mass at the 12 o'clock position on the date of 09/29/2019 showed invasive ductal carcinoma with extracellular mucin. ER status: 90% strong; PR status 95% strong, Her2 status negative; Grade 1. Biopsy of the right breast mass in the upper outer quadrant on the date of 10/19/2019 showed fibroadenoma with atypical lobular hyperplasia.   Past/Anticipated interventions by surgeon, if any: Scheduled for right breast lumpectomy x2 with radioactive seed and right axillary sentinel lymph node biopsy by Dr. Donnie Mesa on 10/30/2019  Past/Anticipated interventions by medical oncology, if any:  Meeting with Dr. Nicholas Lose  later today  Lymphedema issues, if any:  Patient denies   Pain issues, if any:  Reports soreness to biopsy sites and ongoing headaches that she manages with [facility] powder or OTC Tylenol  SAFETY ISSUES:  Prior radiation? No  Pacemaker/ICD? No  Possible current pregnancy? No--postmenopausal  Is the patient on methotrexate? No  Current Complaints / other details:   Smokes ~2 packs of cigarettes daily  She is here with her husband of 35 years.  She has had a stroke in the past.  She is a former Administrator.  PREVIOUS RADIATION THERAPY: No  PAST MEDICAL HISTORY:  has a past medical history of Anxiety, CVA (cerebral vascular accident) ([place]) (07/2013), Headache(784.0), High cholesterol, Hypertension, and Type II diabetes mellitus ([place]).    PAST SURGICAL HISTORY: Past Surgical History:  Procedure Laterality Date  . ANTERIOR CERVICAL DECOMP/DISCECTOMY FUSION N/A 08/09/2015   Procedure: Cervical Four- Five, Cervical Five-Six  Anterior cervical decompression/diskectomy/fusion/plate fixation;  Surgeon: Kevan Ny Ditty, MD;  Location: [facility];  Service: Neurosurgery;  Laterality: N/A;  right side approach  . [place]  . COLONOSCOPY N/A 08/30/2017   Procedure: COLONOSCOPY;  Surgeon: Danie Binder, MD;  [facility];  Service: Endoscopy;  Laterality: N/A;  2:00  . POLYPECTOMY  08/30/2017   Procedure: POLYPECTOMY;  Surgeon: Danie Binder, MD;  Location: [facility];  Service: Endoscopy;;  hepatic flexure polyp hot snare Descending colon polyps x 2 hot snare Rectal polyps x 2 hot snare    FAMILY HISTORY: family history includes Hypertension in her father and mother.  SOCIAL HISTORY:  reports that she has  been smoking cigarettes. She has a 86.00 pack-year smoking history. She has never used smokeless tobacco. She reports previous alcohol use. She reports that she does not use drugs.  ALLERGIES: Patient has no known allergies.  MEDICATIONS:  Current  Outpatient Medications  Medication Sig Dispense Refill  . acetaminophen (TYLENOL) 500 MG tablet Take 500 mg by mouth every 6 (six) hours as needed for mild pain or moderate pain.    Marland Kitchen atorvastatin (LIPITOR) 20 MG tablet TAKE 1 TABLET EVERY DAY 90 tablet 0  . cephALEXin (KEFLEX) 500 MG capsule Take 1 capsule (500 mg total) by mouth 2 (two) times daily. 14 capsule 0  . clonazePAM (KLONOPIN) 0.5 MG tablet Take one qhs prn sleep 30 tablet 5  . escitalopram (LEXAPRO) 20 MG tablet TAKE 1 TABLET EVERY DAY 90 tablet 0  . guaifenesin (ROBITUSSIN) 100 MG/5ML syrup Take 200 mg by mouth 4 (four) times daily as needed for cough.    Marland Kitchen lisinopril-hydrochlorothiazide (ZESTORETIC) 20-12.5 MG tablet TAKE 1 TABLET EVERY DAY 90 tablet 0  . metFORMIN (GLUCOPHAGE) 1000 MG tablet Take 1 tablet (1,000 mg total) by mouth 2 (two) times daily. 180 tablet 1  . ondansetron (ZOFRAN ODT) 4 MG disintegrating tablet Take 1 tablet (4 mg total) by mouth every 8 (eight) hours as needed for nausea or vomiting. 30 tablet 0  . Phenyleph-CPM-DM-APAP (ALKA-SELTZER PLUS COLD & COUGH) 05-09-08-325 MG CAPS Take 1-2 capsules by mouth daily as needed (for cold relief).    . verapamil (CALAN-SR) 240 MG CR tablet TAKE 1 TABLET EVERY MORNING 90 tablet 0  . aspirin 325 MG tablet Take 1 tablet (325 mg total) by mouth daily. (Patient not taking: Reported on 10/21/2019) 30 tablet 5   No current facility-administered medications for this encounter.    REVIEW OF SYSTEMS: As above   PHYSICAL EXAM:  weight is 264 lb 8 oz (120 kg). Her blood pressure is 161/94 (abnormal) and her pulse is 97. Her respiration is 19 and oxygen saturation is 99%.   General: Alert and oriented, in no acute distress HEENT: Head is normocephalic.   Heart: Regular in rate and rhythm  Chest: Clear to auscultation bilaterally, with no rhonchi, wheezes, or rales. Musculoskeletal: ambulatory; good range of motion in her shoulders Neurologic:  Speech is fluent.   Psychiatric:  Judgment and insight are intact. Affect is appropriate. Breasts: Palpable swelling at the biopsy sites in the upper right breast. No other palpable masses appreciated in the breasts or axillae bilaterally.   ECOG = 1  0 - Asymptomatic (Fully active, able to carry on all predisease activities without restriction)  1 - Symptomatic but completely ambulatory (Restricted in physically strenuous activity but ambulatory and able to carry out work of a light or sedentary nature. For example, light housework, office work)  2 - Symptomatic, <50% in bed during the day (Ambulatory and capable of all self care but unable to carry out any work activities. Up and about more than 50% of waking hours)  3 - Symptomatic, >50% in bed, but not bedbound (Capable of only limited self-care, confined to bed or chair 50% or more of waking hours)  4 - Bedbound (Completely disabled. Cannot carry on any self-care. Totally confined to bed or chair)  5 - Death   Eustace Pen MM, Creech RH, Tormey DC, et al. [phone]). "Toxicity and response criteria of the [facility] Group". [place] Oncol. 5 (6): 649-55   LABORATORY DATA:  Lab Results  Component Value Date  WBC 10.4 03/09/2018   HGB 13.9 03/09/2018   HCT 43.7 03/09/2018   MCV 96.0 03/09/2018   PLT 301 03/09/2018   CMP     Component Value Date/Time   NA 135 07/21/2019 1000   K 5.1 07/21/2019 1000   CL 97 07/21/2019 1000   CO2 24 07/21/2019 1000   GLUCOSE 146 (H) 07/21/2019 1000   GLUCOSE 293 (H) 03/09/2018 0518   BUN 9 07/21/2019 1000   CREATININE 0.80 08/24/2019 1006   CALCIUM 9.2 07/21/2019 1000   PROT 6.5 07/21/2019 1000   ALBUMIN 4.1 07/21/2019 1000   AST 20 07/21/2019 1000   ALT 25 07/21/2019 1000   ALKPHOS 98 07/21/2019 1000   BILITOT 0.3 07/21/2019 1000   GFRNONAA 81 07/21/2019 1000   GFRAA 93 07/21/2019 1000         RADIOGRAPHY: US BREAST LTD UNI RIGHT INC AXILLA  Result Date: 09/25/2019 CLINICAL DATA:  Screening recall  for right breast masses. The patient states approximately 6 months prior she had a severe fall over a coffee table. She had several lumps in her superior right breast which have since decreased in size. EXAM: DIGITAL DIAGNOSTIC UNILATERAL RIGHT MAMMOGRAM WITH TOMO AND CAD; ULTRASOUND RIGHT BREAST LIMITED COMPARISON:  Previous exams. ACR Breast Density Category b: There are scattered areas of fibroglandular density. FINDINGS: Spot compression tomograms were performed of the right breast. There is a mass with margin irregularity within the upper central to slightly outer right breast measuring approximately 1.5 cm. There is an additional smaller mass with irregular margins in the outer right breast measuring 0.7 cm. Mammographic images were processed with CAD. Physical examination reveals a small firm mass at the approximate 12 o'clock position of the right breast. Targeted ultrasound of the right breast was performed. There is an irregular hypoechoic mass in the right breast at 12 o'clock 4 cm from nipple measuring 1.1 x 0.8 x 1.5 cm. This corresponds well with the mass seen in the right breast at mammography. A shadowing calcification is seen in the right breast at the 1 o'clock position, corresponding with a benign stable calcifications seen in the upper inner right breast at mammography. There is a small cluster of cysts in the right breast at 9 o'clock 4 cm from nipple measuring 0.7 x 0.2 x 0.5 cm. This may but not definitely correspond with the smaller mass seen in the outer right breast at mammography. No lymphadenopathy seen in the right axilla. IMPRESSION: 1. Indeterminate 1.5 cm mass in the right breast at the 12 o'clock position. 2. Additional smaller 0.7 cm mass in the outer right breast best seen on mammography. RECOMMENDATION: 1. Recommend ultrasound-guided biopsy of the mass in the right breast at the 12 o'clock position. 2. If pathology results of the biopsied mass in the right breast return as benign,  then recommend six-month follow-up mammography of the right breast to demonstrate of stability of the additional smaller mass seen in the outer right breast. If biopsy results return as abnormal/surgical, then recommend the patient be scheduled for stereotactic guided biopsy of the smaller mass in the outer right breast. I have discussed the findings and recommendations with the patient. If applicable, a reminder letter will be sent to the patient regarding the next appointment. BI-RADS CATEGORY  4: Suspicious. Electronically Signed   By: Everlean Alstrom M.D.   On: 09/25/2019 13:18   MM DIAG BREAST TOMO UNI RIGHT  Result Date: 09/25/2019 CLINICAL DATA:  Screening recall for right breast masses. The  patient states approximately 6 months prior she had a severe fall over a coffee table. She had several lumps in her superior right breast which have since decreased in size. EXAM: DIGITAL DIAGNOSTIC UNILATERAL RIGHT MAMMOGRAM WITH TOMO AND CAD; ULTRASOUND RIGHT BREAST LIMITED COMPARISON:  Previous exams. ACR Breast Density Category b: There are scattered areas of fibroglandular density. FINDINGS: Spot compression tomograms were performed of the right breast. There is a mass with margin irregularity within the upper central to slightly outer right breast measuring approximately 1.5 cm. There is an additional smaller mass with irregular margins in the outer right breast measuring 0.7 cm. Mammographic images were processed with CAD. Physical examination reveals a small firm mass at the approximate 12 o'clock position of the right breast. Targeted ultrasound of the right breast was performed. There is an irregular hypoechoic mass in the right breast at 12 o'clock 4 cm from nipple measuring 1.1 x 0.8 x 1.5 cm. This corresponds well with the mass seen in the right breast at mammography. A shadowing calcification is seen in the right breast at the 1 o'clock position, corresponding with a benign stable calcifications seen in  the upper inner right breast at mammography. There is a small cluster of cysts in the right breast at 9 o'clock 4 cm from nipple measuring 0.7 x 0.2 x 0.5 cm. This may but not definitely correspond with the smaller mass seen in the outer right breast at mammography. No lymphadenopathy seen in the right axilla. IMPRESSION: 1. Indeterminate 1.5 cm mass in the right breast at the 12 o'clock position. 2. Additional smaller 0.7 cm mass in the outer right breast best seen on mammography. RECOMMENDATION: 1. Recommend ultrasound-guided biopsy of the mass in the right breast at the 12 o'clock position. 2. If pathology results of the biopsied mass in the right breast return as benign, then recommend six-month follow-up mammography of the right breast to demonstrate of stability of the additional smaller mass seen in the outer right breast. If biopsy results return as abnormal/surgical, then recommend the patient be scheduled for stereotactic guided biopsy of the smaller mass in the outer right breast. I have discussed the findings and recommendations with the patient. If applicable, a reminder letter will be sent to the patient regarding the next appointment. BI-RADS CATEGORY  4: Suspicious. Electronically Signed   By: Everlean Alstrom M.D.   On: 09/25/2019 13:18   MM CLIP PLACEMENT RIGHT  Result Date: 10/19/2019 CLINICAL DATA:  Status post stereotactic guided core needle biopsy of a 7 mm mass in the outer right breast. EXAM: DIAGNOSTIC RIGHT MAMMOGRAM POST STEREOTACTIC BIOPSY COMPARISON:  Previous exam(s). FINDINGS: Mammographic images were obtained following stereotactic guided biopsy of the recently demonstrated 7 mm mass in the outer right breast. The biopsy marking clip is in expected position at the site of biopsy. This is in the upper-outer quadrant of the breast, 2.5 cm superior and lateral to the ribbon shaped clip at the location of the recently diagnosed invasive ductal carcinoma. IMPRESSION: Appropriate  positioning of the X shaped biopsy marking clip at the site of biopsy in the upper-outer quadrant of the right breast. Final Assessment: Post Procedure Mammograms for Marker Placement Electronically Signed   By: Claudie Revering M.D.   On: 10/19/2019 08:18   MM CLIP PLACEMENT RIGHT  Result Date: 09/29/2019 CLINICAL DATA:  Post ultrasound-guided biopsy of a mass in the right breast at the 12 position. EXAM: DIAGNOSTIC RIGHT MAMMOGRAM POST ULTRASOUND BIOPSY COMPARISON:  Previous exams. FINDINGS: Mammographic  images were obtained following ultrasound guided biopsy of a mass in the right breast at the 12 o'clock position. A ribbon shaped biopsy marking clip is present at the site of the biopsied in the right breast at the 12 o'clock position. The patient has a large nearly 9 cm post biopsy hematoma. Her bandage was checked following the postprocedural diagnostic mammogram. The bandage is dry with no new bleeding identified. An ice pack was placed over the biopsy area and the patient was instructed to rest and to intermittently apply ice throughout the remainder of the day as well as tomorrow. IMPRESSION: 1. Ribbon shaped biopsy marking clip at site of biopsied mass in the right breast at the 12 o'clock position. 2.  Large post biopsy hematoma. Final Assessment: Post Procedure Mammograms for Marker Placement Electronically Signed   By: Everlean Alstrom M.D.   On: 09/29/2019 13:38   MM RT BREAST BX W LOC DEV 1ST LESION IMAGE BX SPEC STEREO GUIDE  Addendum Date: 10/20/2019   ADDENDUM REPORT: 10/20/2019 14:54 ADDENDUM: Pathology revealed FIBROADENOMA, ATYPICAL LOBULAR HYPERPLASIA ([place]) of the RIGHT breast, outer. This was found to be concordant by Dr. Claudie Revering. Pathology results were discussed with the patient by telephone. The patient reported doing well after the biopsy with tenderness at the site. Post biopsy instructions and care were reviewed and questions were answered. The patient was encouraged to call [facility] for any additional concerns. The patient has a recent diagnosis of right breast cancer and should follow her outlined treatment plan. Pathology results reported by Stacie Acres RN on 10/20/2019. Electronically Signed   By: Claudie Revering M.D.   On: 10/20/2019 14:54   Result Date: 10/20/2019 CLINICAL DATA:  7 mm mass in the outer right breast at recent mammography with no definite ultrasound correlate. Recently diagnosed invasive ductal carcinoma in the 12 o'clock position of the right breast. EXAM: RIGHT BREAST STEREOTACTIC CORE NEEDLE BIOPSY COMPARISON:  Previous exams. FINDINGS: The patient and I discussed the procedure of stereotactic-guided biopsy including benefits and alternatives. We discussed the high likelihood of a successful procedure. We discussed the risks of the procedure including infection, bleeding, tissue injury, clip migration, and inadequate sampling. Informed written consent was given. The usual time out protocol was performed immediately prior to the procedure. Using sterile technique and 1% Lidocaine as local anesthetic, under stereotactic guidance, a 9 gauge vacuum assisted device was used to perform core needle biopsy of the recently demonstrated 7 mm mass in the outer right breast using a cephalad approach. Lesion quadrant: Upper outer quadrant At the conclusion of the procedure, an X shaped tissue marker clip was deployed into the biopsy cavity. Follow-up 2-view mammogram was performed and dictated separately. IMPRESSION: Stereotactic-guided biopsy of the recently demonstrated 7 mm mass in the outer right breast. No apparent complications. Electronically Signed: By: Claudie Revering M.D. On: 10/19/2019 08:11   [place] RT BREAST BX W LOC DEV 1ST LESION IMG BX SPEC US GUIDE  Addendum Date: 10/01/2019   ADDENDUM REPORT: 09/30/2019 15:14 ADDENDUM: PATHOLOGY revealed: A. BREAST, 12:00, BIOPSY: - Invasive ductal carcinoma with extracellular mucin. Comment: Based on  the biopsy, the carcinoma appears Nottingham grade 1 of 3 and measures 0.9 cm in greatest linear extent. Pathology results are CONCORDANT with imaging findings, per Dr. Everlean Alstrom. Pathology results and recommendations below were discussed with patient by telephone on 09/30/2019. Patient reported biopsy site within normal limits with slight tenderness at the site. Post biopsy care instructions  were reviewed, questions were answered and my direct phone number was provided to patient. Patient was instructed to call [facility] Mammography Department if any concerns or questions arise related to the biopsy. Recommendation: 1. Request for surgical consultation was relayed to Kathi Der RT at [facility] Mammography Department by Electa Sniff RN on 09/30/2019. 2. Please schedule patient for an additional RIGHT breast stereotactic guided biopsy of a smaller mass in the outer RIGHT breast. Please schedule biopsy to be done three weeks after 09/29/2019 in order to allow healing of post procedure hematoma. Pathology results reported by Electa Sniff RN on 09/30/2019. Electronically Signed   By: Everlean Alstrom M.D.   On: 09/30/2019 15:14   Result Date: 10/01/2019 CLINICAL DATA:  59 year old female with an indeterminate mass in the right breast the o'clock position. EXAM: ULTRASOUND GUIDED RIGHT BREAST CORE NEEDLE BIOPSY COMPARISON:  Previous exam(s). PROCEDURE: I met with the patient and we discussed the procedure of ultrasound-guided biopsy, including benefits and alternatives. We discussed the high likelihood of a successful procedure. We discussed the risks of the procedure, including infection, bleeding, tissue injury, clip migration, and inadequate sampling. Informed written consent was given. The usual time-out protocol was performed immediately prior to the procedure. Lesion quadrant: Upper-outer Using sterile technique and 1% Lidocaine as local anesthetic, under direct ultrasound  visualization, a 14 gauge spring-loaded device was used to perform biopsy of the mass in the right breast at the 12 o'clock position using a lateral to medial approach. At the conclusion of the procedure a ribbon shaped tissue marker clip was deployed into the biopsy cavity. Follow up 2 view mammogram was performed and dictated separately. IMPRESSION: Ultrasound guided biopsy of the mass in the right breast at the 12 o'clock position. No apparent complications. Electronically Signed: By: Everlean Alstrom M.D. On: 09/29/2019 13:26      IMPRESSION/PLAN: Right Breast Cancer  She anticipates breast conserving surgery next week.  It was a pleasure meeting the patient today. We discussed the risks, benefits, and side effects of radiotherapy. I recommend radiotherapy to the right breast to reduce her risk of locoregional recurrence by 2/3.  We discussed that radiation would take approximately 4 weeks to complete and that I would give the patient a few weeks to heal following surgery before starting treatment planning.  If chemotherapy were to be given, this would precede radiotherapy. We spoke about acute effects including skin irritation and fatigue as well as much less common late effects including internal organ injury or irritation. We spoke about the latest technology that is used to minimize the risk of late effects for patients undergoing radiotherapy to the breast or chest wall. No guarantees of treatment were given. The patient is enthusiastic about proceeding with treatment. I look forward to participating in the patient's care.  I will await her referral back to me for postoperative follow-up and eventual CT simulation/treatment planning.  The patient is getting her preop Covid test later this week but had some confusion about where to go for that.  We printed out a map to guide the patient and her husband.  We discussed measures to reduce the risk of infection during the COVID-19 pandemic.  She has  received 2 Pfizer vaccines.  She is not interested in receiving her booster shot now but she is eligible.  She would first like to get through her surgery. She knows to talk to one of her providers at the cancer center  when she  would like to receive her booster shot here.  On date of service, in total, I spent 47 minutes on this encounter. Patient was seen in person.   __________________________________________   Eppie Gibson, MD  This document serves as a record of services personally performed by Eppie Gibson, MD. It was created on his behalf by Clerance Lav, a trained medical scribe. The creation of this record is based on the scribe's personal observations and the provider's statements to them. This document has been checked and approved by the attending provider.

## 2019-10-20 NOTE — Progress Notes (Signed)
[facility] CONSULT NOTE  Patient Care Team: Erven Colla, DO as PCP - General (Family Medicine) Danie Binder, MD (Inactive) as Consulting Physician (Gastroenterology)  CHIEF COMPLAINTS/PURPOSE OF CONSULTATION:  Newly diagnosed breast cancer  HISTORY OF PRESENTING ILLNESS:  Shannon Price 59 y.o. female is here because of recent diagnosis of invasive ductal carcinoma of the right breast. Screening mammogram on 09/18/19 showed possible right breast masses. Diagnostic mammogram and [place] on 09/25/19 showed a 1.5cm mass at the 12 o'clock position, a 0.7cm mass in the outer right breast, a shadowing benign calcification at the 1 o'clock position, and a cluster of cysts at the 9 o'clock position, with no right axillary adenopathy. Right breast biopsy of the 12:00 mass showed invasive ductal carcinoma, grade 1, HER-2 negative (1+), ER+ 90%, PR+ 95%, Ki67 15%. Biopsy of the outer right breast mass showed fibroadenoma and atypical lobular hyperplasia. She presents to the clinic today for initial evaluation and discussion of treatment options.   I reviewed her records extensively and collaborated the history with the patient.  SUMMARY OF ONCOLOGIC HISTORY: Oncology History  Malignant neoplasm of upper-outer quadrant of right breast in female, estrogen receptor positive ([place])  10/01/2019 Initial Diagnosis   Screening mammogram showed possible right breast masses. Diagnostic mammogram and US showed a 1.5cm mass at the 12 o'clock position, a 0.7cm mass in the outer right breast, a shadowing benign calcification at the 1 o'clock position, and a cluster of cysts at the 9 o'clock position, with no right axillary adenopathy. Right breast biopsy of the 12:00 mass showed IDC, grade 1, HER-2 negative (1+), ER+ 90%, PR+ 95%, Ki67 15%. Biopsy of the outer right breast mass showed fibroadenoma and ALH.   10/21/2019 Cancer Staging   Staging form: Breast, AJCC 8th Edition - Clinical stage from  10/21/2019: Stage IA (cT1c, cN0, cM0, G1, ER+, PR+, HER2-) - Signed by Nicholas Lose, MD on 10/21/2019     MEDICAL HISTORY:  Past Medical History:  Diagnosis Date  . Anxiety   . CVA (cerebral vascular accident) ([place]) 07/2013   "just affected my speech"  . HQIONGEX(528.4)    "weekly" (07/21/2013)  . High cholesterol   . Hypertension   . Type II diabetes mellitus ([place])     SURGICAL HISTORY: Past Surgical History:  Procedure Laterality Date  . ANTERIOR CERVICAL DECOMP/DISCECTOMY FUSION N/A 08/09/2015   Procedure: Cervical Four- Five, Cervical Five-Six  Anterior cervical decompression/diskectomy/fusion/plate fixation;  Surgeon: Kevan Ny Ditty, MD;  Location: [facility];  Service: Neurosurgery;  Laterality: N/A;  right side approach  . [place]  . COLONOSCOPY N/A 08/30/2017   Procedure: COLONOSCOPY;  Surgeon: Danie Binder, MD;  Location: [facility];  Service: Endoscopy;  Laterality: N/A;  2:00  . POLYPECTOMY  08/30/2017   Procedure: POLYPECTOMY;  Surgeon: Danie Binder, MD;  Location: [facility];  Service: Endoscopy;;  hepatic flexure polyp hot snare Descending colon polyps x 2 hot snare Rectal polyps x 2 hot snare    SOCIAL HISTORY: Social History   Socioeconomic History  . Marital status: Married    Spouse name: Not on file  . Number of children: 2  . Years of education: 10th  . Highest education level: Not on file  Occupational History  . Occupation: N/A  Tobacco Use  . Smoking status: Current Every Day Smoker    Packs/day: 2.00    Years: 43.00    Pack years: 86.00    Types: Cigarettes  .  Smokeless tobacco: Never Used  Vaping Use  . Vaping Use: Never used  Substance and Sexual Activity  . Alcohol use: Not Currently    Comment: occ, 05-09-2016 per pt occa.  . Drug use: No    Comment: 05-09-2016 per pt no  . Sexual activity: Yes    Birth control/protection: Post-menopausal  Other Topics Concern  . Not on file  Social History Narrative    Patient is married with 2 children.   Patient is right handed.   Patient has 10 th grade education.   Patient drinks 5 or more daily.   Social Determinants of Health   Financial Resource Strain:   . Difficulty of Paying Living Expenses: Not on file  Food Insecurity:   . Worried About Charity fundraiser in the Last Year: Not on file  . Ran Out of Food in the Last Year: Not on file  Transportation Needs:   . Lack of Transportation (Medical): Not on file  . Lack of Transportation (Non-Medical): Not on file  Physical Activity:   . Days of Exercise per Week: Not on file  . Minutes of Exercise per Session: Not on file  Stress:   . Feeling of Stress : Not on file  Social Connections:   . Frequency of Communication with Friends and Family: Not on file  . Frequency of Social Gatherings with Friends and Family: Not on file  . Attends Religious Services: Not on file  . Active Member of Clubs or Organizations: Not on file  . Attends Archivist Meetings: Not on file  . Marital Status: Not on file  Intimate Partner Violence:   . Fear of Current or Ex-Partner: Not on file  . Emotionally Abused: Not on file  . Physically Abused: Not on file  . Sexually Abused: Not on file    FAMILY HISTORY: Family History  Problem Relation Age of Onset  . Hypertension Mother   . Hypertension Father     ALLERGIES:  has No Known Allergies.  MEDICATIONS:  Current Outpatient Medications  Medication Sig Dispense Refill  . acetaminophen (TYLENOL) 500 MG tablet Take 500 mg by mouth every 6 (six) hours as needed for mild pain or moderate pain.    Marland Kitchen aspirin 325 MG tablet Take 1 tablet (325 mg total) by mouth daily. (Patient not taking: Reported on 10/21/2019) 30 tablet 5  . atorvastatin (LIPITOR) 20 MG tablet TAKE 1 TABLET EVERY DAY 90 tablet 0  . cephALEXin (KEFLEX) 500 MG capsule Take 1 capsule (500 mg total) by mouth 2 (two) times daily. 14 capsule 0  . clonazePAM (KLONOPIN) 0.5 MG tablet Take  one qhs prn sleep 30 tablet 5  . escitalopram (LEXAPRO) 20 MG tablet TAKE 1 TABLET EVERY DAY 90 tablet 0  . guaifenesin (ROBITUSSIN) 100 MG/5ML syrup Take 200 mg by mouth 4 (four) times daily as needed for cough.    Marland Kitchen lisinopril-hydrochlorothiazide (ZESTORETIC) 20-12.5 MG tablet TAKE 1 TABLET EVERY DAY 90 tablet 0  . metFORMIN (GLUCOPHAGE) 1000 MG tablet Take 1 tablet (1,000 mg total) by mouth 2 (two) times daily. 180 tablet 1  . ondansetron (ZOFRAN ODT) 4 MG disintegrating tablet Take 1 tablet (4 mg total) by mouth every 8 (eight) hours as needed for nausea or vomiting. 30 tablet 0  . Phenyleph-CPM-DM-APAP (ALKA-SELTZER PLUS COLD & COUGH) 05-09-08-325 MG CAPS Take 1-2 capsules by mouth daily as needed (for cold relief).    . verapamil (CALAN-SR) 240 MG CR tablet TAKE 1 TABLET EVERY  MORNING 90 tablet 0   No current facility-administered medications for this visit.    REVIEW OF SYSTEMS:     All other systems were reviewed with the patient and are negative.  PHYSICAL EXAMINATION: ECOG PERFORMANCE STATUS: 1 - Symptomatic but completely ambulatory  Vitals:   10/21/19 1431  BP: (!) 163/98  Pulse: 97  Resp: 17   Filed Weights   10/21/19 1431  Weight: 264 lb 12.8 oz (120.1 kg)    LABORATORY DATA:  I have reviewed the data as listed Lab Results  Component Value Date   WBC 10.4 03/09/2018   HGB 13.9 03/09/2018   HCT 43.7 03/09/2018   MCV 96.0 03/09/2018   PLT 301 03/09/2018   Lab Results  Component Value Date   NA 135 07/21/2019   K 5.1 07/21/2019   CL 97 07/21/2019   CO2 24 07/21/2019    RADIOGRAPHIC STUDIES: I have personally reviewed the radiological reports and agreed with the findings in the report.  ASSESSMENT AND PLAN:  Malignant neoplasm of upper-outer quadrant of right breast in female, estrogen receptor positive ([facility]) Screening mammogram showed possible right breast masses. Diagnostic mammogram and US showed a 1.5cm mass at the 12 o'clock position, a 0.7cm mass in  the outer right breast, a shadowing benign calcification at the 1 o'clock position, and a cluster of cysts at the 9 o'clock position, with no right axillary adenopathy. Right breast biopsy of the 12:00 mass showed IDC, grade 1, HER-2 negative (1+), ER+ 90%, PR+ 95%, Ki67 15%. Biopsy of the outer right breast mass showed fibroadenoma and ALH.  Pathology and radiology counseling:Discussed with the patient, the details of pathology including the type of breast cancer,the clinical staging, the significance of ER, PR and HER-2/neu receptors and the implications for treatment. After reviewing the pathology in detail, we proceeded to discuss the different treatment options between surgery, radiation, chemotherapy, antiestrogen therapies.  Recommendations: 1. Breast conserving surgery followed by 2. Oncotype DX testing to determine if chemotherapy would be of any benefit followed by 3. Adjuvant radiation therapy followed by 4. Adjuvant antiestrogen therapy  Oncotype counseling: I discussed Oncotype DX test. I explained to the patient that this is a 21 gene panel to evaluate patient tumors DNA to calculate recurrence score. This would help determine whether patient has high risk or intermediate risk or low risk breast cancer. She understands that if her tumor was found to be high risk, she would benefit from systemic chemotherapy. If low risk, no need of chemotherapy. If she was found to be intermediate risk, we would need to evaluate the score as well as other risk factors and determine if an abbreviated chemotherapy may be of benefit.  Return to clinic after surgery to discuss final pathology report and then determine if Oncotype DX testing will need to be sent.   All questions were answered. The patient knows to call the clinic with any problems, questions or concerns.   Vinay K Gudena, MD, MPH 10/21/2019    I, Molly Dorshimer, am acting as scribe for Vinay Gudena, MD.  I have reviewed the above  documentation for accuracy and completeness, and I agree with the above.   

## 2019-10-21 ENCOUNTER — Ambulatory Visit
Admission: RE | Admit: 2019-10-21 | Discharge: 2019-10-21 | Disposition: A | Discharge status: Home / Self Care | Payer: Medicare HMO | Source: Ambulatory Visit | Attending: Radiation Oncology | Admitting: Radiation Oncology

## 2019-10-21 ENCOUNTER — Encounter: Payer: Self-pay | Admitting: Radiation Oncology

## 2019-10-21 ENCOUNTER — Inpatient Hospital Stay: Payer: Medicare HMO | Attending: Hematology and Oncology | Admitting: Hematology and Oncology

## 2019-10-21 ENCOUNTER — Other Ambulatory Visit: Payer: Self-pay

## 2019-10-21 VITALS — BP 161/94 | HR 97 | Resp 19 | Wt 264.5 lb

## 2019-10-21 DIAGNOSIS — F17210 Nicotine dependence, cigarettes, uncomplicated: Secondary | ICD-10-CM | POA: Insufficient documentation

## 2019-10-21 DIAGNOSIS — Z8673 Personal history of transient ischemic attack (TIA), and cerebral infarction without residual deficits: Secondary | ICD-10-CM

## 2019-10-21 DIAGNOSIS — Z170 Estrogen receptor positive status [ER+]: Secondary | ICD-10-CM | POA: Insufficient documentation

## 2019-10-21 DIAGNOSIS — C50411 Malignant neoplasm of upper-outer quadrant of right female breast: Secondary | ICD-10-CM | POA: Diagnosis not present

## 2019-10-21 DIAGNOSIS — R519 Headache, unspecified: Secondary | ICD-10-CM | POA: Diagnosis not present

## 2019-10-21 NOTE — Assessment & Plan Note (Signed)
Screening mammogram showed possible right breast masses. Diagnostic mammogram and US showed a 1.5cm mass at the 12 o'clock position, a 0.7cm mass in the outer right breast, a shadowing benign calcification at the 1 o'clock position, and a cluster of cysts at the 9 o'clock position, with no right axillary adenopathy. Right breast biopsy of the 12:00 mass showed IDC, grade 1, HER-2 negative (1+), ER+ 90%, PR+ 95%, Ki67 15%. Biopsy of the outer right breast mass showed fibroadenoma and ALH.  Pathology and radiology counseling:Discussed with the patient, the details of pathology including the type of breast cancer,the clinical staging, the significance of ER, PR and HER-2/neu receptors and the implications for treatment. After reviewing the pathology in detail, we proceeded to discuss the different treatment options between surgery, radiation, chemotherapy, antiestrogen therapies.  Recommendations: 1. Breast conserving surgery followed by 2. Oncotype DX testing to determine if chemotherapy would be of any benefit followed by 3. Adjuvant radiation therapy followed by 4. Adjuvant antiestrogen therapy  Oncotype counseling: I discussed Oncotype DX test. I explained to the patient that this is a 21 gene panel to evaluate patient tumors DNA to calculate recurrence score. This would help determine whether patient has high risk or intermediate risk or low risk breast cancer. She understands that if her tumor was found to be high risk, she would benefit from systemic chemotherapy. If low risk, no need of chemotherapy. If she was found to be intermediate risk, we would need to evaluate the score as well as other risk factors and determine if an abbreviated chemotherapy may be of benefit.  Return to clinic after surgery to discuss final pathology report and then determine if Oncotype DX testing will need to be sent.

## 2019-10-22 ENCOUNTER — Telehealth: Payer: Self-pay | Admitting: *Deleted

## 2019-10-22 ENCOUNTER — Encounter: Payer: Self-pay | Admitting: Licensed Clinical Social Worker

## 2019-10-22 ENCOUNTER — Encounter: Payer: Self-pay | Admitting: *Deleted

## 2019-10-22 NOTE — Progress Notes (Signed)
[facility] Psychosocial Distress Screening Clinical Social Work  Clinical Social Work was referred by distress screening protocol.  The patient scored a 9 on the Psychosocial Distress Thermometer which indicates severe distress. Clinical Social Worker attempted to contact patient by phone to assess for distress and other psychosocial needs.  No answer, no voicemail available.  ONCBCN DISTRESS SCREENING 10/21/2019  Screening Type Initial Screening  Distress experienced in past week (1-10) 9  Emotional problem type Nervousness/Anxiety;Adjusting to illness  Physical Problem type Pain  Physician notified of physical symptoms Yes  Referral to clinical psychology No  Referral to clinical social work Yes  Referral to dietition No  Referral to financial advocate No  Referral to support programs Yes  Referral to palliative care No      [facility] E, LCSW

## 2019-10-22 NOTE — Telephone Encounter (Signed)
Called pt to provide navigation resources and contact information. Unable to leave vm requesting return call with questions or needs regarding dx or treatment care plan d/t mailbox is full

## 2019-10-23 ENCOUNTER — Telehealth: Payer: Self-pay | Admitting: Licensed Clinical Social Worker

## 2019-10-23 NOTE — Telephone Encounter (Signed)
[facility] Clinical Social Work  Attempted to call patient again to follow-up on distress screen. No answer, voicemail full so unable to leave message.  CSW may be reconsulted as needed in the future.   Edwinna Areola Charm Stenner , LCSW

## 2019-10-26 ENCOUNTER — Other Ambulatory Visit: Payer: Self-pay

## 2019-10-26 ENCOUNTER — Encounter (HOSPITAL_BASED_OUTPATIENT_CLINIC_OR_DEPARTMENT_OTHER): Payer: Self-pay | Admitting: Surgery

## 2019-10-27 ENCOUNTER — Encounter (HOSPITAL_BASED_OUTPATIENT_CLINIC_OR_DEPARTMENT_OTHER)
Admission: RE | Admit: 2019-10-27 | Discharge: 2019-10-27 | Disposition: A | Discharge status: Home / Self Care | Payer: Medicare HMO | Source: Ambulatory Visit | Attending: Surgery | Admitting: Surgery

## 2019-10-27 ENCOUNTER — Other Ambulatory Visit: Payer: Self-pay

## 2019-10-27 ENCOUNTER — Other Ambulatory Visit (HOSPITAL_COMMUNITY)
Admission: RE | Admit: 2019-10-27 | Discharge: 2019-10-27 | Disposition: A | Discharge status: Home / Self Care | Payer: Medicare HMO | Source: Ambulatory Visit | Attending: Surgery | Admitting: Surgery

## 2019-10-27 DIAGNOSIS — Z01812 Encounter for preprocedural laboratory examination: Secondary | ICD-10-CM | POA: Diagnosis not present

## 2019-10-27 DIAGNOSIS — Z8673 Personal history of transient ischemic attack (TIA), and cerebral infarction without residual deficits: Secondary | ICD-10-CM | POA: Diagnosis not present

## 2019-10-27 DIAGNOSIS — Z01818 Encounter for other preprocedural examination: Secondary | ICD-10-CM | POA: Insufficient documentation

## 2019-10-27 DIAGNOSIS — Z170 Estrogen receptor positive status [ER+]: Secondary | ICD-10-CM | POA: Diagnosis not present

## 2019-10-27 DIAGNOSIS — F17210 Nicotine dependence, cigarettes, uncomplicated: Secondary | ICD-10-CM | POA: Diagnosis not present

## 2019-10-27 DIAGNOSIS — Z20822 Contact with and (suspected) exposure to covid-19: Secondary | ICD-10-CM | POA: Insufficient documentation

## 2019-10-27 DIAGNOSIS — C50411 Malignant neoplasm of upper-outer quadrant of right female breast: Secondary | ICD-10-CM | POA: Diagnosis not present

## 2019-10-27 LAB — BASIC METABOLIC PANEL
Anion gap: 10 (ref 5–15)
BUN: 7 mg/dL (ref 6–20)
CO2: 26 mmol/L (ref 22–32)
Calcium: 9 mg/dL (ref 8.9–10.3)
Chloride: 98 mmol/L (ref 98–111)
Creatinine, Ser: 0.79 mg/dL (ref 0.44–1.00)
GFR, Estimated: >60 mL/min (ref >60)
Glucose, Bld: 145 mg/dL — ABNORMAL HIGH (ref 70–99)
Potassium: 4.5 mmol/L (ref 3.5–5.1)
Sodium: 134 mmol/L — ABNORMAL LOW (ref 135–145)

## 2019-10-27 LAB — SARS CORONAVIRUS 2 (TAT 6-24 HRS): SARS Coronavirus 2: NEGATIVE

## 2019-10-27 NOTE — Progress Notes (Signed)

## 2019-10-29 ENCOUNTER — Telehealth: Payer: Self-pay | Admitting: *Deleted

## 2019-10-29 ENCOUNTER — Ambulatory Visit
Admission: RE | Admit: 2019-10-29 | Discharge: 2019-10-29 | Disposition: A | Discharge status: Home / Self Care | Payer: Medicare HMO | Source: Ambulatory Visit | Attending: Surgery | Admitting: Surgery

## 2019-10-29 ENCOUNTER — Other Ambulatory Visit: Payer: Self-pay

## 2019-10-29 DIAGNOSIS — C50911 Malignant neoplasm of unspecified site of right female breast: Secondary | ICD-10-CM

## 2019-10-29 NOTE — Telephone Encounter (Signed)
[facility] called stating patient went in for seed placement for her upcoming surgery and had elevated bp. Surgery had to be cancelled and pt needs to be evaluated in the office. They are also facing over a clearance form. Please contact pt to get her scheduled with either Dr. Lovena Le or NP

## 2019-10-30 ENCOUNTER — Ambulatory Visit (HOSPITAL_BASED_OUTPATIENT_CLINIC_OR_DEPARTMENT_OTHER): Admission: RE | Admit: 2019-10-30 | Payer: Medicare HMO | Source: Home / Self Care | Admitting: Surgery

## 2019-10-30 ENCOUNTER — Encounter (HOSPITAL_COMMUNITY): Payer: Medicare HMO

## 2019-10-30 HISTORY — DX: Nicotine dependence, unspecified, uncomplicated: F17.200

## 2019-10-30 HISTORY — DX: Depression, unspecified: F32.A

## 2019-10-30 HISTORY — DX: Chronic obstructive pulmonary disease, unspecified: J44.9

## 2019-10-30 SURGERY — BREAST LUMPECTOMY WITH RADIOACTIVE SEED AND SENTINEL LYMPH NODE BIOPSY
Anesthesia: General | Site: Breast | Laterality: Right

## 2019-10-30 NOTE — Telephone Encounter (Signed)
Patient will need an appointment and paperwork can go to the provider she is scheduled with.

## 2019-10-30 NOTE — Telephone Encounter (Signed)
Haven't seen a form yet come in on patient. When did they send form?

## 2019-11-03 ENCOUNTER — Ambulatory Visit (INDEPENDENT_AMBULATORY_CARE_PROVIDER_SITE_OTHER): Payer: Medicare HMO | Admitting: Family Medicine

## 2019-11-03 ENCOUNTER — Encounter: Payer: Self-pay | Admitting: Family Medicine

## 2019-11-03 ENCOUNTER — Other Ambulatory Visit: Payer: Self-pay

## 2019-11-03 VITALS — BP 132/82 | HR 115 | Temp 96.7°F | Ht 65.0 in | Wt 264.4 lb

## 2019-11-03 DIAGNOSIS — Z01818 Encounter for other preprocedural examination: Secondary | ICD-10-CM

## 2019-11-03 NOTE — Progress Notes (Addendum)
Pt here to be cleared for surgery. Pt is having double lumpectomy on 11/24/19.     Patient ID: [facility], female    DOB: 20-Dec-1960, 59 y.o.   MRN: [id number]   Chief Complaint  Patient presents with  . [place]   Subjective:  CC: surgical risk visit  Presents today for surgical risk evaluation.  She is scheduled for her right breast lumpectomy with Dr. [name] on November 16.  The surgery had to be rescheduled due to her uncontrolled hypertension.  Shannon [facility] admits to not taking her blood pressure medications as prescribed.  She now has a strict routine, and that she takes her medications as soon as her feet touch the floor.  Shannon Price is a smoker and this will be addressed during this visit.    Medical History [facility] has a past medical history of Anxiety, Cancer ([place]) (09/2019), COPD (chronic obstructive pulmonary disease) ([place]), CVA (cerebral vascular accident) ([place]) (07/2013), Depression, High cholesterol, Hypertension, Smoker, and Type II diabetes mellitus ([place]).   Outpatient Encounter Medications as of 11/03/2019  Medication Sig  . acetaminophen (TYLENOL) 500 MG tablet Take 500 mg by mouth every 6 (six) hours as needed for mild pain or moderate pain.  [facility] Kitchen aspirin 325 MG tablet Take 1 tablet (325 mg total) by mouth daily.  [facility] Kitchen atorvastatin (LIPITOR) 20 MG tablet TAKE 1 TABLET EVERY DAY  . clonazePAM (KLONOPIN) 0.5 MG tablet Take one qhs prn sleep  . escitalopram (LEXAPRO) 20 MG tablet TAKE 1 TABLET EVERY DAY  . guaifenesin (ROBITUSSIN) 100 MG/5ML syrup Take 200 mg by mouth 4 (four) times daily as needed for cough.  [facility] Kitchen lisinopril-hydrochlorothiazide (ZESTORETIC) 20-12.5 MG tablet TAKE 1 TABLET EVERY DAY  . metFORMIN (GLUCOPHAGE) 1000 MG tablet Take 1 tablet (1,000 mg total) by mouth 2 (two) times daily.  . ondansetron (ZOFRAN ODT) 4 MG disintegrating tablet Take 1 tablet (4 mg total) by mouth every 8 (eight) hours as needed for nausea or vomiting.  [facility] Kitchen Phenyleph-CPM-DM-APAP (ALKA-SELTZER PLUS COLD &  COUGH) 05-09-08-325 MG CAPS Take 1-2 capsules by mouth daily as needed (for cold relief).  . verapamil (CALAN-SR) 240 MG CR tablet TAKE 1 TABLET EVERY MORNING   No facility-administered encounter medications on file as of 11/03/2019.     Review of Systems  Constitutional: Negative.   HENT: Negative.   Eyes: Negative.   Respiratory: Negative.   Cardiovascular: Negative.   Gastrointestinal: Negative.   Endocrine: Negative.   Genitourinary: Negative.   Allergic/Immunologic: Negative.   Neurological: Negative.   Hematological: Negative.   Psychiatric/Behavioral:       Concerned about cancer diagnosis.     Vitals BP 132/82   Pulse (!) 115   Temp (!) 96.7 F (35.9 C)   Ht 5\' 5"  (1.651 m)   Wt 264 lb 6.4 oz (119.9 kg)   SpO2 98%   BMI 44.00 kg/m   Objective:   Physical Exam Vitals and nursing note reviewed.  Constitutional:      Appearance: Normal appearance. She is obese.  Cardiovascular:     Rate and Rhythm: Regular rhythm. Tachycardia present.     Heart sounds: Normal heart sounds.     Comments: Admits to smoking a cigarette just before this visit today.  Pulmonary:     Effort: Pulmonary effort is normal.     Breath sounds: Normal breath sounds.  Skin:    General: Skin is warm and dry.  Neurological:     Mental Status: She is alert and oriented to person,  place, and time.  Psychiatric:        Mood and Affect: Mood normal.        Behavior: Behavior normal.        Thought Content: Thought content normal.        Judgment: Judgment normal.      Assessment and Plan   1. Pre-operative clearance   Scheduled for right breast lumpectomy on November 16.  Recent labs show good renal function and on  July 13--  A1c 6.2, she consistently is taking her Metformin. Labs reviewed with [facility] and recent ECG on 10/27/19 reviewed: normal without ectopy or ischemia noted. Risk factors identified and reviewed in detail.   1) cigarette smoking: It is imperative for Shannon Price to cut  down/eliminate cigarettes prior to the surgical procedure.  Thorough explanation given as to why this is important for her recovery.  She will try to eliminate/cut down to as few cigarettes as possible before her surgery date.  Explained the importance of smoking cessation related to wound healing postoperatively.  2) mobility: Instructed Shannon Price to start walking at least 10 minutes/day immediately and throughout her recovery.  Explained risk of deep vein thrombosis, pneumonia, and other surgical complications can be prevented through mobility.  3) good nutrition/ Type 2 Diabetes: Encouraged Shannon Price to include fruits, vegetables, lean proteins, immediately and throughout her recovery.  Nutrition is very important for wound healing along with tight blood sugar control.   4) blood pressure: Her blood pressure today is controlled.  132/82.  She has been compliant with her blood pressure medication.  Her heart rate was elevated at 115, she admits to just smoking a cigarette prior to entering the building, this is likely responsible for her elevated heart rate today.  I feel certain that if she cuts back on cigarettes her heart rate and her blood pressure will be even better controlled.  Agrees with plan of care discussed today. Understands warning signs to seek further care: Chest pain, shortness of breath, any significant changes in health status. Understands to follow-up after her surgical procedure, for postop follow-up per surgeon recommendation.   Shannon Price does have moderate risk factors for surgical complications:  hypertension, type 2 diabetes, obesity, and cigarette smoking.  She has been compliant with her current medication regimen, her blood pressure is under better controlled.  She will start a walking routine, to enhance postoperative recovery.  It is ideal for her to be smoking zero cigarettes by surgical day.  She understands the importance of managing these risk factors before having surgery and will  continue taking current medications as prescribed.     Chalmers Guest, NP 11/03/2019

## 2019-11-03 NOTE — Patient Instructions (Signed)
It is ideal to have zero cigarettes in your body by surgery day. Start walking today for at least 10 minutes, so that you can be active throughout your recovery time.  Eat a high quality diet, that includes some fruits and vegetables and healthy protein. Take all medications consistently every day!   Coping with Quitting Smoking  Quitting smoking is a physical and mental challenge. You will face cravings, withdrawal symptoms, and temptation. Before quitting, work with your health care provider to make a plan that can help you cope. Preparation can help you quit and keep you from giving in. How can I cope with cravings? Cravings usually last for 5-10 minutes. If you get through it, the craving will pass. Consider taking the following actions to help you cope with cravings:  Keep your mouth busy: ? Chew sugar-free gum. ? Suck on hard candies or a straw. ? Brush your teeth.  Keep your hands and body busy: ? Immediately change to a different activity when you feel a craving. ? Squeeze or play with a ball. ? Do an activity or a hobby, like making bead jewelry, practicing needlepoint, or working with wood. ? Mix up your normal routine. ? Take a short exercise break. Go for a quick walk or run up and down stairs. ? Spend time in public places where smoking is not allowed.  Focus on doing something kind or helpful for someone else.  Call a friend or family member to talk during a craving.  Join a support group.  Call a quit line, such as 1-800-QUIT-NOW.  Talk with your health care provider about medicines that might help you cope with cravings and make quitting easier for you. How can I deal with withdrawal symptoms? Your body may experience negative effects as it tries to get used to not having nicotine in the system. These effects are called withdrawal symptoms. They may include:  Feeling hungrier than normal.  Trouble concentrating.  Irritability.  Trouble sleeping.  Feeling  depressed.  Restlessness and agitation.  Craving a cigarette. To manage withdrawal symptoms:  Avoid places, people, and activities that trigger your cravings.  Remember why you want to quit.  Get plenty of sleep.  Avoid coffee and other caffeinated drinks. These may worsen some of your symptoms. How can I handle social situations? Social situations can be difficult when you are quitting smoking, especially in the first few weeks. To manage this, you can:  Avoid parties, bars, and other social situations where people might be smoking.  Avoid alcohol.  Leave right away if you have the urge to smoke.  Explain to your family and friends that you are quitting smoking. Ask for understanding and support.  Plan activities with friends or family where smoking is not an option. What are some ways I can cope with stress? Wanting to smoke may cause stress, and stress can make you want to smoke. Find ways to manage your stress. Relaxation techniques can help. For example:  Breathe slowly and deeply, in through your nose and out through your mouth.  Listen to soothing, relaxing music.  Talk with a family member or friend about your stress.  Light a candle.  Soak in a bath or take a shower.  Think about a peaceful place. What are some ways I can prevent weight gain? Be aware that many people gain weight after they quit smoking. However, not everyone does. To keep from gaining weight, have a plan in place before you quit and stick to  the plan after you quit. Your plan should include:  Having healthy snacks. When you have a craving, it may help to: ? Eat plain popcorn, crunchy carrots, celery, or other cut vegetables. ? Chew sugar-free gum.  Changing how you eat: ? Eat small portion sizes at meals. ? Eat 4-6 small meals throughout the day instead of 1-2 large meals a day. ? Be mindful when you eat. Do not watch television or do other things that might distract you as you  eat.  Exercising regularly: ? Make time to exercise each day. If you do not have time for a long workout, do short bouts of exercise for 5-10 minutes several times a day. ? Do some form of strengthening exercise, like weight lifting, and some form of aerobic exercise, like running or swimming.  Drinking plenty of water or other low-calorie or no-calorie drinks. Drink 6-8 glasses of water daily, or as much as instructed by your health care provider. Summary  Quitting smoking is a physical and mental challenge. You will face cravings, withdrawal symptoms, and temptation to smoke again. Preparation can help you as you go through these challenges.  You can cope with cravings by keeping your mouth busy (such as by chewing gum), keeping your body and hands busy, and making calls to family, friends, or a helpline for people who want to quit smoking.  You can cope with withdrawal symptoms by avoiding places where people smoke, avoiding drinks with caffeine, and getting plenty of rest.  Ask your health care provider about the different ways to prevent weight gain, avoid stress, and handle social situations. This information is not intended to replace advice given to you by your health care provider. Make sure you discuss any questions you have with your health care provider. Document Revised: 12/07/2016 Document Reviewed: 12/23/2015 Elsevier Patient Education  2020 [employer].

## 2019-11-05 ENCOUNTER — Encounter: Payer: Self-pay | Admitting: *Deleted

## 2019-11-05 ENCOUNTER — Telehealth: Payer: Self-pay | Admitting: Hematology and Oncology

## 2019-11-05 NOTE — Telephone Encounter (Signed)
Scheduled appt per 10/28 sch msg - left message for patient with appt date and time

## 2019-11-09 ENCOUNTER — Ambulatory Visit: Payer: Medicare HMO | Admitting: Hematology and Oncology

## 2019-11-13 ENCOUNTER — Other Ambulatory Visit: Payer: Self-pay | Admitting: Family Medicine

## 2019-11-13 DIAGNOSIS — I63312 Cerebral infarction due to thrombosis of left middle cerebral artery: Secondary | ICD-10-CM

## 2019-11-13 DIAGNOSIS — F321 Major depressive disorder, single episode, moderate: Secondary | ICD-10-CM

## 2019-11-17 ENCOUNTER — Other Ambulatory Visit: Payer: Self-pay

## 2019-11-17 ENCOUNTER — Encounter (HOSPITAL_BASED_OUTPATIENT_CLINIC_OR_DEPARTMENT_OTHER): Payer: Self-pay | Admitting: Surgery

## 2019-11-20 ENCOUNTER — Other Ambulatory Visit (HOSPITAL_COMMUNITY)
Admission: RE | Admit: 2019-11-20 | Discharge: 2019-11-20 | Disposition: A | Discharge status: Home / Self Care | Payer: Medicare HMO | Source: Ambulatory Visit | Attending: Surgery | Admitting: Surgery

## 2019-11-20 DIAGNOSIS — Z20822 Contact with and (suspected) exposure to covid-19: Secondary | ICD-10-CM | POA: Diagnosis not present

## 2019-11-20 DIAGNOSIS — Z01812 Encounter for preprocedural laboratory examination: Secondary | ICD-10-CM | POA: Diagnosis not present

## 2019-11-20 LAB — SARS CORONAVIRUS 2 (TAT 6-24 HRS): SARS Coronavirus 2: NEGATIVE

## 2019-11-23 ENCOUNTER — Ambulatory Visit
Admission: RE | Admit: 2019-11-23 | Discharge: 2019-11-23 | Disposition: A | Discharge status: Home / Self Care | Payer: Medicare HMO | Source: Ambulatory Visit | Attending: Surgery | Admitting: Surgery

## 2019-11-23 ENCOUNTER — Other Ambulatory Visit: Payer: Self-pay

## 2019-11-23 DIAGNOSIS — C50911 Malignant neoplasm of unspecified site of right female breast: Secondary | ICD-10-CM

## 2019-11-23 DIAGNOSIS — N6091 Unspecified benign mammary dysplasia of right breast: Secondary | ICD-10-CM | POA: Diagnosis not present

## 2019-11-23 DIAGNOSIS — C50411 Malignant neoplasm of upper-outer quadrant of right female breast: Secondary | ICD-10-CM | POA: Diagnosis not present

## 2019-11-24 ENCOUNTER — Encounter (HOSPITAL_BASED_OUTPATIENT_CLINIC_OR_DEPARTMENT_OTHER): Payer: Self-pay | Admitting: Surgery

## 2019-11-24 ENCOUNTER — Ambulatory Visit (HOSPITAL_BASED_OUTPATIENT_CLINIC_OR_DEPARTMENT_OTHER): Payer: Medicare HMO | Admitting: Anesthesiology

## 2019-11-24 ENCOUNTER — Ambulatory Visit
Admission: RE | Admit: 2019-11-24 | Discharge: 2019-11-24 | Disposition: A | Discharge status: Home / Self Care | Payer: Medicare HMO | Source: Ambulatory Visit | Attending: Surgery | Admitting: Surgery

## 2019-11-24 ENCOUNTER — Encounter (HOSPITAL_BASED_OUTPATIENT_CLINIC_OR_DEPARTMENT_OTHER)
Admission: RE | Disposition: A | Discharge status: Home / Self Care | Payer: Self-pay | Source: Home / Self Care | Attending: Surgery

## 2019-11-24 ENCOUNTER — Encounter (HOSPITAL_COMMUNITY)
Admission: RE | Admit: 2019-11-24 | Discharge: 2019-11-24 | Disposition: A | Discharge status: Home / Self Care | Payer: Medicare HMO | Source: Ambulatory Visit | Attending: Surgery | Admitting: Surgery

## 2019-11-24 ENCOUNTER — Other Ambulatory Visit: Payer: Self-pay

## 2019-11-24 ENCOUNTER — Ambulatory Visit (HOSPITAL_BASED_OUTPATIENT_CLINIC_OR_DEPARTMENT_OTHER)
Admission: RE | Admit: 2019-11-24 | Discharge: 2019-11-24 | Disposition: A | Discharge status: Home / Self Care | Payer: Medicare HMO | Attending: Surgery | Admitting: Surgery

## 2019-11-24 DIAGNOSIS — C50911 Malignant neoplasm of unspecified site of right female breast: Secondary | ICD-10-CM | POA: Diagnosis not present

## 2019-11-24 DIAGNOSIS — Z171 Estrogen receptor negative status [ER-]: Secondary | ICD-10-CM | POA: Diagnosis not present

## 2019-11-24 DIAGNOSIS — Z170 Estrogen receptor positive status [ER+]: Secondary | ICD-10-CM | POA: Diagnosis not present

## 2019-11-24 DIAGNOSIS — N6091 Unspecified benign mammary dysplasia of right breast: Secondary | ICD-10-CM | POA: Diagnosis not present

## 2019-11-24 DIAGNOSIS — Z8616 Personal history of COVID-19: Secondary | ICD-10-CM | POA: Diagnosis not present

## 2019-11-24 DIAGNOSIS — G8918 Other acute postprocedural pain: Secondary | ICD-10-CM | POA: Diagnosis not present

## 2019-11-24 DIAGNOSIS — F17210 Nicotine dependence, cigarettes, uncomplicated: Secondary | ICD-10-CM | POA: Diagnosis not present

## 2019-11-24 DIAGNOSIS — C50411 Malignant neoplasm of upper-outer quadrant of right female breast: Secondary | ICD-10-CM | POA: Diagnosis not present

## 2019-11-24 DIAGNOSIS — I10 Essential (primary) hypertension: Secondary | ICD-10-CM | POA: Diagnosis not present

## 2019-11-24 HISTORY — PX: BREAST LUMPECTOMY WITH RADIOACTIVE SEED AND SENTINEL LYMPH NODE BIOPSY: SHX6550

## 2019-11-24 LAB — GLUCOSE, CAPILLARY
Glucose-Capillary: 174 mg/dL — ABNORMAL HIGH (ref 70–99)
Glucose-Capillary: 232 mg/dL — ABNORMAL HIGH (ref 70–99)

## 2019-11-24 SURGERY — BREAST LUMPECTOMY WITH RADIOACTIVE SEED AND SENTINEL LYMPH NODE BIOPSY
Anesthesia: General | Site: Breast | Laterality: Right

## 2019-11-24 MED ORDER — CEFAZOLIN SODIUM-DEXTROSE 2-4 GM/100ML-% IV SOLN
INTRAVENOUS | Status: AC
  Filled 2019-11-24: qty 100

## 2019-11-24 MED ORDER — PROPOFOL 500 MG/50ML IV EMUL
INTRAVENOUS | Status: AC
  Filled 2019-11-24: qty 50

## 2019-11-24 MED ORDER — FENTANYL CITRATE (PF) 100 MCG/2ML IJ SOLN
INTRAMUSCULAR | Status: DC | PRN
  Administered 2019-11-24 (×2): 25 ug via INTRAVENOUS
  Administered 2019-11-24: 50 ug via INTRAVENOUS

## 2019-11-24 MED ORDER — MIDAZOLAM HCL 2 MG/2ML IJ SOLN
INTRAMUSCULAR | Status: AC
  Filled 2019-11-24: qty 2

## 2019-11-24 MED ORDER — FENTANYL CITRATE (PF) 100 MCG/2ML IJ SOLN
INTRAMUSCULAR | Status: AC
  Filled 2019-11-24: qty 2

## 2019-11-24 MED ORDER — LACTATED RINGERS IV SOLN: INTRAVENOUS | Status: DC

## 2019-11-24 MED ORDER — FENTANYL CITRATE (PF) 100 MCG/2ML IJ SOLN
100.0000 ug | Freq: Once | INTRAMUSCULAR | Status: AC
  Administered 2019-11-24: 100 ug via INTRAVENOUS

## 2019-11-24 MED ORDER — EPHEDRINE SULFATE 50 MG/ML IJ SOLN
INTRAMUSCULAR | Status: DC | PRN
  Administered 2019-11-24 (×2): 10 mg via INTRAVENOUS

## 2019-11-24 MED ORDER — MEPERIDINE HCL 25 MG/ML IJ SOLN: 6.2500 mg | INTRAMUSCULAR | Status: DC | PRN

## 2019-11-24 MED ORDER — KETOROLAC TROMETHAMINE 15 MG/ML IJ SOLN: 15.0000 mg | Freq: Once | INTRAMUSCULAR | Status: DC

## 2019-11-24 MED ORDER — FENTANYL CITRATE (PF) 100 MCG/2ML IJ SOLN: 25.0000 ug | INTRAMUSCULAR | Status: DC | PRN

## 2019-11-24 MED ORDER — PHENYLEPHRINE HCL (PRESSORS) 10 MG/ML IV SOLN
INTRAVENOUS | Status: DC | PRN
  Administered 2019-11-24 (×4): 80 ug via INTRAVENOUS

## 2019-11-24 MED ORDER — ROPIVACAINE HCL 5 MG/ML IJ SOLN
INTRAMUSCULAR | Status: DC | PRN
  Administered 2019-11-24 (×6): 5 mL via PERINEURAL

## 2019-11-24 MED ORDER — CEFAZOLIN SODIUM-DEXTROSE 2-4 GM/100ML-% IV SOLN
2.0000 g | INTRAVENOUS | Status: AC
  Administered 2019-11-24: 2 g via INTRAVENOUS

## 2019-11-24 MED ORDER — TECHNETIUM TC 99M TILMANOCEPT KIT
1.0000 | PACK | Freq: Once | INTRAVENOUS | Status: AC | PRN
  Administered 2019-11-24: 1 via INTRADERMAL

## 2019-11-24 MED ORDER — GABAPENTIN 300 MG PO CAPS
ORAL_CAPSULE | ORAL | Status: AC
  Filled 2019-11-24: qty 1

## 2019-11-24 MED ORDER — LIDOCAINE HCL (CARDIAC) PF 100 MG/5ML IV SOSY
PREFILLED_SYRINGE | INTRAVENOUS | Status: DC | PRN
  Administered 2019-11-24: 100 mg via INTRATRACHEAL

## 2019-11-24 MED ORDER — CLONIDINE HCL (ANALGESIA) 100 MCG/ML EP SOLN
EPIDURAL | Status: DC | PRN
  Administered 2019-11-24: 100 ug

## 2019-11-24 MED ORDER — ONDANSETRON HCL 4 MG/2ML IJ SOLN
INTRAMUSCULAR | Status: DC | PRN
  Administered 2019-11-24: 4 mg via INTRAVENOUS

## 2019-11-24 MED ORDER — OXYCODONE HCL 5 MG/5ML PO SOLN: 5.0000 mg | Freq: Once | ORAL | Status: DC | PRN

## 2019-11-24 MED ORDER — ACETAMINOPHEN 500 MG PO TABS
1000.0000 mg | ORAL_TABLET | ORAL | Status: AC
  Administered 2019-11-24: 1000 mg via ORAL

## 2019-11-24 MED ORDER — GABAPENTIN 300 MG PO CAPS
300.0000 mg | ORAL_CAPSULE | ORAL | Status: AC
  Administered 2019-11-24: 300 mg via ORAL

## 2019-11-24 MED ORDER — BUPIVACAINE-EPINEPHRINE 0.25% -1:200000 IJ SOLN
INTRAMUSCULAR | Status: DC | PRN
  Administered 2019-11-24: 15 mL

## 2019-11-24 MED ORDER — EPHEDRINE 5 MG/ML INJ
INTRAVENOUS | Status: AC
  Filled 2019-11-24: qty 10

## 2019-11-24 MED ORDER — ACETAMINOPHEN 325 MG PO TABS: 325.0000 mg | ORAL_TABLET | ORAL | Status: DC | PRN

## 2019-11-24 MED ORDER — ACETAMINOPHEN 160 MG/5ML PO SOLN: 325.0000 mg | ORAL | Status: DC | PRN

## 2019-11-24 MED ORDER — PROPOFOL 500 MG/50ML IV EMUL
INTRAVENOUS | Status: DC | PRN
  Administered 2019-11-24: 25 ug/kg/min via INTRAVENOUS

## 2019-11-24 MED ORDER — ONDANSETRON HCL 4 MG/2ML IJ SOLN: 4.0000 mg | Freq: Once | INTRAMUSCULAR | Status: DC | PRN

## 2019-11-24 MED ORDER — OXYCODONE HCL 5 MG PO TABS: 5.0000 mg | ORAL_TABLET | Freq: Four times a day (QID) | ORAL | 0 refills | Status: DC | PRN

## 2019-11-24 MED ORDER — CHLORHEXIDINE GLUCONATE CLOTH 2 % EX PADS: 6.0000 | MEDICATED_PAD | Freq: Once | CUTANEOUS | Status: DC

## 2019-11-24 MED ORDER — ACETAMINOPHEN 500 MG PO TABS
ORAL_TABLET | ORAL | Status: AC
  Filled 2019-11-24: qty 2

## 2019-11-24 MED ORDER — PROPOFOL 10 MG/ML IV BOLUS
INTRAVENOUS | Status: DC | PRN
  Administered 2019-11-24: 200 mg via INTRAVENOUS

## 2019-11-24 MED ORDER — PHENYLEPHRINE 40 MCG/ML (10ML) SYRINGE FOR IV PUSH (FOR BLOOD PRESSURE SUPPORT)
PREFILLED_SYRINGE | INTRAVENOUS | Status: AC
  Filled 2019-11-24: qty 10

## 2019-11-24 MED ORDER — MIDAZOLAM HCL 2 MG/2ML IJ SOLN
2.0000 mg | Freq: Once | INTRAMUSCULAR | Status: AC
  Administered 2019-11-24: 2 mg via INTRAVENOUS

## 2019-11-24 MED ORDER — DEXAMETHASONE SODIUM PHOSPHATE 10 MG/ML IJ SOLN
INTRAMUSCULAR | Status: AC
  Filled 2019-11-24: qty 1

## 2019-11-24 MED ORDER — ONDANSETRON HCL 4 MG/2ML IJ SOLN
INTRAMUSCULAR | Status: AC
  Filled 2019-11-24: qty 2

## 2019-11-24 MED ORDER — SODIUM CHLORIDE (PF) 0.9 % IJ SOLN: INTRAVENOUS | Status: DC | PRN

## 2019-11-24 MED ORDER — OXYCODONE HCL 5 MG PO TABS: 5.0000 mg | ORAL_TABLET | Freq: Once | ORAL | Status: DC | PRN

## 2019-11-24 SURGICAL SUPPLY — 64 items
APL PRP STRL LF DISP 70% ISPRP (MISCELLANEOUS) ×1
APL SKNCLS STERI-STRIP NONHPOA (GAUZE/BANDAGES/DRESSINGS) ×1
APPLIER CLIP 9.375 MED OPEN (MISCELLANEOUS)
APR CLP MED 9.3 20 MLT OPN (MISCELLANEOUS)
BENZOIN TINCTURE PRP APPL 2/3 (GAUZE/BANDAGES/DRESSINGS) ×3 IMPLANT
BINDER BREAST 3XL (GAUZE/BANDAGES/DRESSINGS) IMPLANT
BINDER BREAST LRG (GAUZE/BANDAGES/DRESSINGS) IMPLANT
BINDER BREAST MEDIUM (GAUZE/BANDAGES/DRESSINGS) IMPLANT
BINDER BREAST XLRG (GAUZE/BANDAGES/DRESSINGS) IMPLANT
BINDER BREAST XXLRG (GAUZE/BANDAGES/DRESSINGS) IMPLANT
BLADE CLIPPER SURG (BLADE) IMPLANT
BLADE HEX COATED 2.75 (ELECTRODE) ×3 IMPLANT
BLADE SURG 10 STRL SS (BLADE) IMPLANT
BLADE SURG 15 STRL LF DISP TIS (BLADE) ×1 IMPLANT
BLADE SURG 15 STRL SS (BLADE) ×3
CANISTER SUC SOCK COL 7IN (MISCELLANEOUS) ×3 IMPLANT
CANISTER SUCT 1200ML W/VALVE (MISCELLANEOUS) IMPLANT
CHLORAPREP W/TINT 26 (MISCELLANEOUS) ×3 IMPLANT
CLIP APPLIE 9.375 MED OPEN (MISCELLANEOUS) IMPLANT
CLOSURE WOUND 1/2 X4 (GAUZE/BANDAGES/DRESSINGS) ×1
COVER BACK TABLE 60X90IN (DRAPES) ×3 IMPLANT
COVER MAYO STAND STRL (DRAPES) ×3 IMPLANT
COVER PROBE W GEL 5X96 (DRAPES) ×3 IMPLANT
COVER WAND RF STERILE (DRAPES) IMPLANT
DECANTER SPIKE VIAL GLASS SM (MISCELLANEOUS) IMPLANT
DRAPE LAPAROSCOPIC ABDOMINAL (DRAPES) ×3 IMPLANT
DRAPE UTILITY XL STRL (DRAPES) ×3 IMPLANT
DRSG TEGADERM 4X4.75 (GAUZE/BANDAGES/DRESSINGS) ×6 IMPLANT
ELECT BLADE 4.0 EZ CLEAN MEGAD (MISCELLANEOUS) ×3
ELECT REM PT RETURN 9FT ADLT (ELECTROSURGICAL) ×3
ELECTRODE BLDE 4.0 EZ CLN MEGD (MISCELLANEOUS) ×1 IMPLANT
ELECTRODE REM PT RTRN 9FT ADLT (ELECTROSURGICAL) ×1 IMPLANT
GAUZE SPONGE 4X4 12PLY STRL LF (GAUZE/BANDAGES/DRESSINGS) IMPLANT
GLOVE BIO SURGEON STRL SZ 6.5 (GLOVE) ×2 IMPLANT
GLOVE BIO SURGEON STRL SZ7 (GLOVE) ×9 IMPLANT
GLOVE BIO SURGEONS STRL SZ 6.5 (GLOVE) ×1
GLOVE BIOGEL PI IND STRL 7.5 (GLOVE) ×1 IMPLANT
GLOVE BIOGEL PI INDICATOR 7.5 (GLOVE) ×2
GOWN STRL REUS W/ TWL LRG LVL3 (GOWN DISPOSABLE) ×3 IMPLANT
GOWN STRL REUS W/TWL LRG LVL3 (GOWN DISPOSABLE) ×9
ILLUMINATOR WAVEGUIDE N/F (MISCELLANEOUS) IMPLANT
KIT MARKER MARGIN INK (KITS) ×3 IMPLANT
LIGHT WAVEGUIDE WIDE FLAT (MISCELLANEOUS) IMPLANT
NDL SAFETY ECLIPSE 18X1.5 (NEEDLE) ×1 IMPLANT
NEEDLE HYPO 18GX1.5 SHARP (NEEDLE) ×3
NEEDLE HYPO 25X1 1.5 SAFETY (NEEDLE) ×6 IMPLANT
NS IRRIG 1000ML POUR BTL (IV SOLUTION) ×3 IMPLANT
PACK BASIN DAY SURGERY FS (CUSTOM PROCEDURE TRAY) ×3 IMPLANT
PENCIL SMOKE EVACUATOR (MISCELLANEOUS) ×3 IMPLANT
SLEEVE SCD COMPRESS KNEE MED (MISCELLANEOUS) ×3 IMPLANT
SPONGE LAP 18X18 RF (DISPOSABLE) IMPLANT
SPONGE LAP 4X18 RFD (DISPOSABLE) ×3 IMPLANT
STRIP CLOSURE SKIN 1/2X4 (GAUZE/BANDAGES/DRESSINGS) ×2 IMPLANT
SUT MON AB 4-0 PC3 18 (SUTURE) ×3 IMPLANT
SUT SILK 2 0 SH (SUTURE) IMPLANT
SUT VIC AB 3-0 SH 27 (SUTURE) ×3
SUT VIC AB 3-0 SH 27X BRD (SUTURE) ×1 IMPLANT
SYR BULB EAR ULCER 3OZ GRN STR (SYRINGE) ×3 IMPLANT
SYR CONTROL 10ML LL (SYRINGE) ×6 IMPLANT
TOWEL GREEN STERILE FF (TOWEL DISPOSABLE) ×3 IMPLANT
TRAY FAXITRON CT DISP (TRAY / TRAY PROCEDURE) ×3 IMPLANT
TUBE CONNECTING 20'X1/4 (TUBING) ×1
TUBE CONNECTING 20X1/4 (TUBING) ×2 IMPLANT
YANKAUER SUCT BULB TIP NO VENT (SUCTIONS) IMPLANT

## 2019-11-24 NOTE — Anesthesia Preprocedure Evaluation (Addendum)
Anesthesia Evaluation  Patient identified by MRN, date of birth, ID band Patient awake    Reviewed: Allergy & Precautions, NPO status , Patient's Chart, lab work & pertinent test results  Airway Mallampati: II       Dental no notable dental hx. (+) Poor Dentition, Missing   Pulmonary Current Smoker and Patient abstained from smoking.,    Pulmonary exam normal        Cardiovascular hypertension, Pt. on medications Normal cardiovascular exam     Neuro/Psych  Headaches, PSYCHIATRIC DISORDERS Anxiety Depression CVA, No Residual Symptoms    GI/Hepatic negative GI ROS, Neg liver ROS,   Endo/Other  diabetes, Type 2, Oral Hypoglycemic AgentsMorbid obesity  Renal/GU negative Renal ROS     Musculoskeletal   Abdominal (+) + obese,   Peds  Hematology negative hematology ROS (+)   Anesthesia Other Findings   Reproductive/Obstetrics                            Anesthesia Physical Anesthesia Plan  ASA: III  Anesthesia Plan: General   Post-op Pain Management: GA combined w/ Regional for post-op pain   Induction: Intravenous  PONV Risk Score and Plan: 2 and Ondansetron, Midazolam and Treatment may vary due to age or medical condition  Airway Management Planned: LMA  Additional Equipment: None  Intra-op Plan:   Post-operative Plan: Extubation in OR  Informed Consent: I have reviewed the patients History and Physical, chart, labs and discussed the procedure including the risks, benefits and alternatives for the proposed anesthesia with the patient or authorized representative who has indicated his/her understanding and acceptance.     Dental advisory given  Plan Discussed with: CRNA  Anesthesia Plan Comments:         Anesthesia Quick Evaluation

## 2019-11-24 NOTE — Anesthesia Procedure Notes (Signed)
Procedure Name: LMA Insertion Date/Time: 11/24/2019 1:09 PM Performed by: Glory Buff, CRNA Pre-anesthesia Checklist: Patient identified, Emergency Drugs available, Suction available and Patient being monitored Patient Re-evaluated:Patient Re-evaluated prior to induction Oxygen Delivery Method: Circle system utilized Preoxygenation: Pre-oxygenation with 100% oxygen Induction Type: IV induction Ventilation: Mask ventilation without difficulty LMA: LMA inserted LMA Size: 4.0 Number of attempts: 1 Placement Confirmation: positive ETCO2 Tube secured with: Tape Dental Injury: Teeth and Oropharynx as per pre-operative assessment

## 2019-11-24 NOTE — H&P (View-Only) (Signed)
History of Present Illness  The patient is a 59 year old female who presents with breast cancer. Referred by Dr. Elvia Collum for right invasive ductal carcinoma   This is a 59 year old female who presents after recent diagnosis of right breast cancer. She had a routine screening mammogram on 09/18/19. Her previous screening mammogram was in 2019. She did not have a mammogram in 2020 due to Covid. The screening mammogram showed possible right breast mass. She underwent diagnostic mammogram and ultrasound on 9/17 that showed mass in the right breast at 12:00 4 cm from the nipple measuring 1.5 x 1.1 x 0.8 cm. At 9:00 4 cm from the nipple there is a second mass measuring 0.7 x 0.2 x 0.5 cm. 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 The second biopsy was not performed and has not yet been scheduled. Ultrasound showed no enlarged lymph nodes in the right axilla.  The patient denies any previous breast problems and no family history of breast cancer.  She does smoke 2 packs a day.  She wants to have all of her oncology care performed here in [place].   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 Cephalexin (500MG Capsule, Oral) Active. Atorvastatin Calcium (20MG Tablet, Oral) Active. Escitalopram Oxalate (20MG Tablet, Oral) Active. Verapamil HCl ER (240MG Tablet ER, Oral) Active. Ondansetron (4MG Tablet Disint, Oral) Active. metFORMIN HCl (1000MG Tablet, Oral) Active. clonazePAM  (0.5MG Tablet, Oral) Active. Aspirin (81MG Tablet DR, Oral) Active. Medications Reconciled  Social History  Alcohol use Occasional alcohol use. Caffeine use Carbonated beverages. No drug use Tobacco use Current every day smoker.  Family History  Diabetes Mellitus Father. Seizure disorder Son.  Pregnancy / Birth History  Age at menarche 40 years. Age of menopause <47 Gravida 2 Irregular periods Maternal age <15 Para 2  Other Problems Cerebrovascular Accident Depression Diabetes Mellitus High blood pressure Hypercholesterolemia     Review of Systems  General Not Present- Appetite Loss, Chills, Fatigue, Fever, Night Sweats, Weight Gain and Weight Loss. Skin Not Present- Change in Wart/Mole, Dryness, Hives, Jaundice, New Lesions, Non-Healing Wounds, Rash and Ulcer. HEENT Not Present- Earache, Hearing Loss, Hoarseness, Nose Bleed, Oral Ulcers, Ringing in the Ears, Seasonal Allergies, Sinus Pain, Sore Throat, Visual Disturbances, Wears glasses/contact lenses and Yellow Eyes. Respiratory Not Present- Bloody sputum, Chronic Cough, Difficulty Breathing, Snoring and Wheezing. Breast Not Present- Breast Mass, Breast Pain, Nipple Discharge and Skin Changes. Cardiovascular Not Present- Chest Pain, Difficulty Breathing Lying Down, Leg Cramps, Palpitations, Rapid Heart Rate, Shortness of Breath and Swelling of Extremities. Gastrointestinal Not Present- Abdominal Pain, Bloating, Bloody Stool, Change in Bowel Habits, Chronic diarrhea, Constipation, Difficulty Swallowing, Excessive gas, Gets full quickly at meals, Hemorrhoids, Indigestion, Nausea, Rectal Pain and Vomiting. Female Genitourinary Not Present- Frequency, Nocturia, Painful Urination, Pelvic Pain and Urgency. Musculoskeletal Not Present- Back Pain, Joint Pain, Joint Stiffness, Muscle Pain, Muscle Weakness and Swelling of Extremities. Neurological Not Present- Decreased Memory, Fainting, Headaches,  Numbness, Seizures, Tingling, Tremor, Trouble walking and Weakness. Psychiatric Not Present- Anxiety, Bipolar, Change in Sleep Pattern, Depression, Fearful and Frequent crying. Endocrine Not Present- Cold Intolerance, Excessive  Hunger, Hair Changes, Heat Intolerance, Hot flashes and New Diabetes. Hematology Not Present- Blood Thinners, Easy Bruising, Excessive bleeding, Gland problems, HIV and Persistent Infections.  Vitals  Weight: 266.25 lb Height: 66in Body Surface Area: 2.26 m Body Mass Index: 42.97 kg/m  Temp.: 98.88F  Pulse: 111 (Regular)  P.OX: 97% (Room air) BP: 126/86(Sitting, Left Arm, Standard)        Physical Exam   The physical exam findings are as follows: Note:Constitutional: WDWN in NAD, conversant, no obvious deformities; resting comfortably Eyes: Pupils equal, round; sclera anicteric; moist conjunctiva; no lid lag HENT: Oral mucosa moist; good dentition Neck: No masses palpated, trachea midline; no thyromegaly Lungs: CTA bilaterally; normal respiratory effort Breasts: Symmetric except for significant bruising in the right breast that seems to be resolving Left breast shows no palpable masses or lymphadenopathy. No nipple changes or discharge Right breast shows a firm palpable 3 cm mass at 12:00 approximately 4 cm from the nipple. The patient states that this got larger after her biopsy and is likely postbiopsy hematoma. No palpable masses in the remainder of the breast. No axillary lymphadenopathy. 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 The surgical procedure has been discussed with the patient. Potential risks, benefits, alternative treatments, and expected outcomes have been explained. All of the patient's questions at this time have been answered. The likelihood of reaching the patient's treatment goal is good. The patient understand the proposed surgical procedure and wishes to proceed. Note:We will try to expedite the biopsy of the mass at 9:00.  We discussed her options for surgical treatment including mastectomy versus breast conserving therapy. After thorough discussion, we have opted for breast conserving therapy. We will plan right radioactive seed localized lumpectomy 2 to include the mass at 9:00 which showed ALH/ fibroadenoma.  [name]. [name], MD, [facility]  General/ Trauma Surgery   11/24/2019 12:46 PM

## 2019-11-24 NOTE — Transfer of Care (Signed)
Immediate Anesthesia Transfer of Care Note  Patient: Shannon Price  Procedure(s) Performed: RIGHT BREAST LUMPECTOMY X 2 WITH RADIOACTIVE SEED AND RIGHT AXILLARY SENTINEL LYMPH NODE BIOPSY, BLUE DYE INJECTION (Right Breast)  Patient Location: PACU  Anesthesia Type:General  Level of Consciousness: drowsy, patient cooperative and responds to stimulation  Airway & Oxygen Therapy: Patient Spontanous Breathing and Patient connected to face mask oxygen  Post-op Assessment: Report given to RN and Post -op Vital signs reviewed and stable  Post vital signs: Reviewed and stable  Last Vitals:  Vitals Value Taken Time  BP    Temp    Pulse 108 11/24/19 1451  Resp    SpO2 99 % 11/24/19 1451  Vitals shown include unvalidated device data.  Last Pain:  Vitals:   11/24/19 1121  TempSrc: Oral  PainSc: 0-No pain         Complications: No complications documented.

## 2019-11-24 NOTE — Progress Notes (Signed)
Assisted Dr. Hatchett with right, ultrasound guided, pectoralis block. Side rails up, monitors on throughout procedure. See vital signs in flow sheet. Tolerated Procedure well. 

## 2019-11-24 NOTE — Anesthesia Postprocedure Evaluation (Signed)
Anesthesia Post Note  Patient: Shannon Price  Procedure(s) Performed: RIGHT BREAST LUMPECTOMY X 2 WITH RADIOACTIVE SEED AND RIGHT AXILLARY SENTINEL LYMPH NODE BIOPSY, BLUE DYE INJECTION (Right Breast)     Patient location during evaluation: Phase II Anesthesia Type: General Level of consciousness: awake Pain management: pain level controlled Vital Signs Assessment: post-procedure vital signs reviewed and stable Respiratory status: spontaneous breathing Cardiovascular status: stable Postop Assessment: no apparent nausea or vomiting Anesthetic complications: no   No complications documented.  Last Vitals:  Vitals:   11/24/19 1502 11/24/19 1515  BP:  120/66  Pulse: (!) 102 100  Resp: 18 19  Temp:    SpO2: 100% 96%    Last Pain:  Vitals:   11/24/19 1451  TempSrc:   PainSc: [name]

## 2019-11-24 NOTE — Discharge Instructions (Signed)
No Tylenol until 5:30pm today if needed.   [place] Office Phone Number [phone]  BREAST BIOPSY/ PARTIAL MASTECTOMY: POST OP INSTRUCTIONS  Always review your discharge instruction sheet given to you by the facility where your surgery was performed.  IF YOU HAVE DISABILITY OR FAMILY LEAVE FORMS, YOU MUST BRING THEM TO THE OFFICE FOR PROCESSING.  DO NOT GIVE THEM TO YOUR DOCTOR.  1. A prescription for pain medication may be given to you upon discharge.  Take your pain medication as prescribed, if needed.  If narcotic pain medicine is not needed, then you may take acetaminophen (Tylenol) or ibuprofen (Advil) as needed. 2. Take your usually prescribed medications unless otherwise directed 3. If you need a refill on your pain medication, please contact your pharmacy.  They will contact our office to request authorization.  Prescriptions will not be filled after 5pm or on week-ends. 4. You should eat very light the first 24 hours after surgery, such as soup, crackers, pudding, etc.  Resume your normal diet the day after surgery. 5. Most patients will experience some swelling and bruising in the breast.  Ice packs and a good support bra will help.  Swelling and bruising can take several days to resolve.  6. It is common to experience some constipation if taking pain medication after surgery.  Increasing fluid intake and taking a stool softener will usually help or prevent this problem from occurring.  A mild laxative (Milk of Magnesia or Miralax) should be taken according to package directions if there are no bowel movements after 48 hours. 7. Unless discharge instructions indicate otherwise, you may remove your bandages 24-48 hours after surgery, and you may shower at that time.  You may have steri-strips (small skin tapes) in place directly over the incision.  These strips should be left on the skin for 7-10 days.  If your surgeon used skin glue on the incision, you may shower in  24 hours.  The glue will flake off over the next 2-3 weeks.  Any sutures or staples will be removed at the office during your follow-up visit. 8. ACTIVITIES:  You may resume regular daily activities (gradually increasing) beginning the next day.  Wearing a good support bra or sports bra minimizes pain and swelling.  You may have sexual intercourse when it is comfortable. a. You may drive when you no longer are taking prescription pain medication, you can comfortably wear a seatbelt, and you can safely maneuver your car and apply brakes. b. RETURN TO WORK:  ______________________________________________________________________________________ 9. You should see your doctor in the office for a follow-up appointment approximately two weeks after your surgery.  Your doctor's nurse will typically make your follow-up appointment when she calls you with your pathology report.  Expect your pathology report 2-3 business days after your surgery.  You may call to check if you do not hear from [place] after three days. 10. OTHER INSTRUCTIONS: _______________________________________________________________________________________________ _____________________________________________________________________________________________________________________________________ _____________________________________________________________________________________________________________________________________ _____________________________________________________________________________________________________________________________________  WHEN TO CALL YOUR DOCTOR: 1. Fever over 101.0 2. Nausea and/or vomiting. 3. Extreme swelling or bruising. 4. Continued bleeding from incision. 5. Increased pain, redness, or drainage from the incision.  The clinic staff is available to answer your questions during regular business hours.  Please don't hesitate to call and ask to speak to one of the nurses for clinical concerns.  If you have  a medical emergency, go to the nearest emergency room or call 911.  A surgeon from [facility] is always on call at the  hospital.  For further questions, please visit [email or web address]    Post Anesthesia Home Care Instructions  Activity: Get plenty of rest for the remainder of the day. A responsible individual must stay with you for 24 hours following the procedure.  For the next 24 hours, DO NOT: -Drive a car -Paediatric nurse -Drink alcoholic beverages -Take any medication unless instructed by your physician -Make any legal decisions or sign important papers.  Meals: Start with liquid foods such as gelatin or soup. Progress to regular foods as tolerated. Avoid greasy, spicy, heavy foods. If nausea and/or vomiting occur, drink only clear liquids until the nausea and/or vomiting subsides. Call your physician if vomiting continues.  Special Instructions/Symptoms: Your throat may feel dry or sore from the anesthesia or the breathing tube placed in your throat during surgery. If this causes discomfort, gargle with warm salt water. The discomfort should disappear within 24 hours.  If you had a scopolamine patch placed behind your ear for the management of post- operative nausea and/or vomiting:  1. The medication in the patch is effective for 72 hours, after which it should be removed.  Wrap patch in a tissue and discard in the trash. Wash hands thoroughly with soap and water. 2. You may remove the patch earlier than 72 hours if you experience unpleasant side effects which may include dry mouth, dizziness or visual disturbances. 3. Avoid touching the patch. Wash your hands with soap and water after contact with the patch.

## 2019-11-24 NOTE — Anesthesia Procedure Notes (Addendum)
Anesthesia Regional Block: Pectoralis block   Pre-Anesthetic Checklist: ,, timeout performed, Correct Patient, Correct Site, Correct Laterality, Correct Procedure, Correct Position, site marked, Risks and benefits discussed,  Surgical consent,  Pre-op evaluation,  At surgeon's request and post-op pain management  Laterality: N/A and Right  Prep: chloraprep       Needles:  Injection technique: Single-shot  Needle Type: Echogenic Stimulator Needle     Needle Length: 10cm  Needle Gauge: 21   Needle insertion depth: 2.5 cm   Additional Needles:   Procedures:,,,, ultrasound used (permanent image in chart),,,,  Narrative:  Start time: 11/24/2019 11:50 AM End time: 11/24/2019 12:00 PM Injection made incrementally with aspirations every 5 mL.  Performed by: Personally  Anesthesiologist: Lyn Hollingshead, MD

## 2019-11-24 NOTE — Op Note (Addendum)
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  I have reviewed and agree with the operative note as documented by the resident.   Anesthesia: GA combined with regional for post-op pain  Clinical History and Indications: This is a 59 year old female who presents after recent diagnosis of right breast cancer.  She had a routine screening mammogram on 09/18/19.  Her previous screening mammogram was in 2019.  She did not have a mammogram in 2020 due to Covid.  The screening mammogram showed possible right breast mass.  She underwent diagnostic mammogram and ultrasound on 9/17 that showed mass in the right breast at 12:00 4 cm from the nipple measuring 1.5 x 1.1 x 0.8 cm.  At 9:00 4 cm from the nipple there is a second mass measuring 0.7 x 0.2 x 0.5 cm.  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  The second biopsy was performed and revealed ALH/ fibroadenoma.  Ultrasound showed no enlarged lymph nodes in the right axilla.  This patient presents for a seed localized excision of two right breast lesions and a sentinel lymph node biopsy.  Description of procedure: Presence of the seed had been confirmed in the preop holding area.  The patient was brought to the operating room and placed in a supine position on the operating room table. After an adequate level of general anesthesia was obtained, 5 ml of methylene blue was instill under right right nipple  areolar complex and her right breast was prepped with ChloraPrep and draped in sterile fashion. A timeout was taken to ensure the proper patient and proper procedure. We infiltrated the area under the planned incision with 0.25% Marcaine with epinephrine.  A radial incision was made in the right lateral breast between the two seed localization locations. We dissected down into the breast tissue with cautery. Using the neoprobe for localization, we raised 4 margins around the superior mass. We dissected down until we were deep to the area of greatest activity. The specimen was removed and oriented with a paint kit.  The tumor is fairly superficial and the anterior margin is the posterior dermis. Specimen mammogram confirmed that the biopsy clip and the radioactive seed are within the center of the specimen. This was sent for pathologic examination. We irrigated the wound thoroughly and inspected for hemostasis. The lateral lesion with the seed localization was removed in the same fashion as described above.    A curvilinear incision was then made under the right axilla at the region of highest probe activity. Blunt and electrocautery dissection was used to dissect three separate sentinel lymph nodes using the probe as a guide. Lymphatic channels and arterioles were clipped when appropriate. After removal of the three lymph nodes, the background probe activity was sufficiently low, and the wound was irrigated and inspected for hemostasis.   After achieving hemostasis, both incisions were closed with a deep layer of 3-0 Vicryl and a subcuticular layer of 4-0 Monocryl. Steri-Strips and clean dressings were applied.   The patient tolerated the procedure well.  The patient was then extubated and brought to the recovery room in stable condition. All sponge, instrument, and needle counts are correct.  Complications: None   EBL: 20 ml   Shannon Price. Shannon Dover, MD, [facility]  General/ Trauma  Surgery   11/24/2019 3:09 PM

## 2019-11-24 NOTE — Progress Notes (Signed)
Nuc med injections completed. Patient tolerated well.   

## 2019-11-24 NOTE — H&P (Signed)
History of Present Illness  The patient is a 59 year old female who presents with breast cancer. Referred by Dr. Malena Taylor for right invasive ductal carcinoma   This is a 59-year-old female who presents after recent diagnosis of right breast cancer. She had a routine screening mammogram on 09/18/19. Her previous screening mammogram was in 2019. She did not have a mammogram in 2020 due to Covid. The screening mammogram showed possible right breast mass. She underwent diagnostic mammogram and ultrasound on 9/17 that showed mass in the right breast at 12:00 4 cm from the nipple measuring 1.5 x 1.1 x 0.8 cm. At 9:00 4 cm from the nipple there is a second mass measuring 0.7 x 0.2 x 0.5 cm. 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 The second biopsy was not performed and has not yet been scheduled. Ultrasound showed no enlarged lymph nodes in the right axilla.  The patient denies any previous breast problems and no family history of breast cancer.  She does smoke 2 packs a day.  She wants to have all of her oncology care performed here in [place].   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 Cephalexin (500MG Capsule, Oral) Active. Atorvastatin Calcium (20MG Tablet, Oral) Active. Escitalopram Oxalate (20MG Tablet, Oral) Active. Verapamil HCl ER (240MG Tablet ER, Oral) Active. Ondansetron (4MG Tablet Disint, Oral) Active. metFORMIN HCl (1000MG Tablet, Oral) Active. clonazePAM  (0.5MG Tablet, Oral) Active. Aspirin (81MG Tablet DR, Oral) Active. Medications Reconciled  Social History  Alcohol use Occasional alcohol use. Caffeine use Carbonated beverages. No drug use Tobacco use Current every day smoker.  Family History  Diabetes Mellitus Father. Seizure disorder Son.  Pregnancy / Birth History  Age at menarche 13 years. Age of menopause <47 Gravida 2 Irregular periods Maternal age <15 Para 2  Other Problems Cerebrovascular Accident Depression Diabetes Mellitus High blood pressure Hypercholesterolemia     Review of Systems  General Not Present- Appetite Loss, Chills, Fatigue, Fever, Night Sweats, Weight Gain and Weight Loss. Skin Not Present- Change in Wart/Mole, Dryness, Hives, Jaundice, New Lesions, Non-Healing Wounds, Rash and Ulcer. HEENT Not Present- Earache, Hearing Loss, Hoarseness, Nose Bleed, Oral Ulcers, Ringing in the Ears, Seasonal Allergies, Sinus Pain, Sore Throat, Visual Disturbances, Wears glasses/contact lenses and Yellow Eyes. Respiratory Not Present- Bloody sputum, Chronic Cough, Difficulty Breathing, Snoring and Wheezing. Breast Not Present- Breast Mass, Breast Pain, Nipple Discharge and Skin Changes. Cardiovascular Not Present- Chest Pain, Difficulty Breathing Lying Down, Leg Cramps, Palpitations, Rapid Heart Rate, Shortness of Breath and Swelling of Extremities. Gastrointestinal Not Present- Abdominal Pain, Bloating, Bloody Stool, Change in Bowel Habits, Chronic diarrhea, Constipation, Difficulty Swallowing, Excessive gas, Gets full quickly at meals, Hemorrhoids, Indigestion, Nausea, Rectal Pain and Vomiting. Female Genitourinary Not Present- Frequency, Nocturia, Painful Urination, Pelvic Pain and Urgency. Musculoskeletal Not Present- Back Pain, Joint Pain, Joint Stiffness, Muscle Pain, Muscle Weakness and Swelling of Extremities. Neurological Not Present- Decreased Memory, Fainting, Headaches,  Numbness, Seizures, Tingling, Tremor, Trouble walking and Weakness. Psychiatric Not Present- Anxiety, Bipolar, Change in Sleep Pattern, Depression, Fearful and Frequent crying. Endocrine Not Present- Cold Intolerance, Excessive   Hunger, Hair Changes, Heat Intolerance, Hot flashes and New Diabetes. Hematology Not Present- Blood Thinners, Easy Bruising, Excessive bleeding, Gland problems, HIV and Persistent Infections.  Vitals  Weight: 266.25 lb Height: 66in Body Surface Area: 2.26 m Body Mass Index: 42.97 kg/m  Temp.: 98.3F  Pulse: 111 (Regular)  P.OX: 97% (Room air) BP: 126/86(Sitting, Left Arm, Standard)        Physical Exam   The physical exam findings are as follows: Note:Constitutional: WDWN in NAD, conversant, no obvious deformities; resting comfortably Eyes: Pupils equal, round; sclera anicteric; moist conjunctiva; no lid lag HENT: Oral mucosa moist; good dentition Neck: No masses palpated, trachea midline; no thyromegaly Lungs: CTA bilaterally; normal respiratory effort Breasts: Symmetric except for significant bruising in the right breast that seems to be resolving Left breast shows no palpable masses or lymphadenopathy. No nipple changes or discharge Right breast shows a firm palpable 3 cm mass at 12:00 approximately 4 cm from the nipple. The patient states that this got larger after her biopsy and is likely postbiopsy hematoma. No palpable masses in the remainder of the breast. No axillary lymphadenopathy. 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 The surgical procedure has been discussed with the patient. Potential risks, benefits, alternative treatments, and expected outcomes have been explained. All of the patient's questions at this time have been answered. The likelihood of reaching the patient's treatment goal is good. The patient understand the proposed surgical procedure and wishes to proceed. Note:We will try to expedite the biopsy of the mass at 9:00.  We discussed her options for surgical treatment including mastectomy versus breast conserving therapy. After thorough discussion, we have opted for breast conserving therapy. We will plan right radioactive seed localized lumpectomy 2 to include the mass at 9:00 which showed ALH/ fibroadenoma.  Sabrinia Prien K. Bonnie Roig, MD, FACS [facility]  General/ Trauma Surgery   11/24/2019 12:46 PM   

## 2019-11-25 ENCOUNTER — Encounter (HOSPITAL_BASED_OUTPATIENT_CLINIC_OR_DEPARTMENT_OTHER): Payer: Self-pay | Admitting: Surgery

## 2019-11-25 ENCOUNTER — Other Ambulatory Visit: Payer: Self-pay | Admitting: Family Medicine

## 2019-11-25 NOTE — Addendum Note (Signed)
Addendum  created 11/25/19 1027 by Glory Buff, CRNA   Charge Capture section accepted

## 2019-11-26 ENCOUNTER — Ambulatory Visit: Payer: Self-pay | Admitting: Surgery

## 2019-11-26 LAB — SURGICAL PATHOLOGY

## 2019-11-27 ENCOUNTER — Encounter: Payer: Self-pay | Admitting: *Deleted

## 2019-11-27 ENCOUNTER — Telehealth: Payer: Self-pay | Admitting: *Deleted

## 2019-11-27 NOTE — Telephone Encounter (Signed)
Received oncotype testing order. Requisition faxed to pathology and [facility]

## 2019-11-30 NOTE — Progress Notes (Signed)
Patient Care Team: Erven Colla, DO as PCP - General (Family Medicine) Danie Binder, MD (Inactive) as Consulting Physician (Gastroenterology) Mauro Kaufmann, RN as Oncology Nurse Navigator Rockwell Germany, RN as Oncology Nurse Navigator  DIAGNOSIS:    ICD-10-CM   1. Malignant neoplasm of upper-outer quadrant of right breast in female, estrogen receptor positive ([place])  C50.411    Z17.0     SUMMARY OF ONCOLOGIC HISTORY: Oncology History  Malignant neoplasm of upper-outer quadrant of right breast in female, estrogen receptor positive ([place])  10/01/2019 Initial Diagnosis   Screening mammogram showed possible right breast masses. Diagnostic mammogram and US showed a 1.5cm mass at the 12 o'clock position, a 0.7cm mass in the outer right breast, a shadowing benign calcification at the 1 o'clock position, and a cluster of cysts at the 9 o'clock position, with no right axillary adenopathy. Right breast biopsy of the 12:00 mass showed IDC, grade 1, HER-2 negative (1+), ER+ 90%, PR+ 95%, Ki67 15%. Biopsy of the outer right breast mass showed fibroadenoma and ALH.   10/21/2019 Cancer Staging   Staging form: Breast, AJCC 8th Edition - Clinical stage from 10/21/2019: Stage IA (cT1c, cN0, cM0, G1, ER+, PR+, HER2-) - Signed by Nicholas Lose, MD on 10/21/2019   11/24/2019 Surgery   Right lumpectomy (Tsuei): IDC, 0.9cm, grade 2, involved superior margin, 5 right axillary lymph nodes negative for carcinoma.     CHIEF COMPLIANT: Follow-up s/p right lumpectomy  INTERVAL HISTORY: Shannon Price is a 59 y.o. with above-mentioned history of right breast cancer. She underwent a right lumpectomy on 11/24/19 with Dr. [name] showed invasive ductal carcinoma, 0.9cm, grade 2, involved superior margin, 5 right axillary lymph nodes negative for carcinoma. She presents to the clinic today to review the pathology report and discuss further treatment.  Postoperative pain is very minimal and she is doing very well  with Tylenol.  ALLERGIES:  has No Known Allergies.  MEDICATIONS:  Current Outpatient Medications  Medication Sig Dispense Refill  . acetaminophen (TYLENOL) 500 MG tablet Take 500 mg by mouth every 6 (six) hours as needed for mild pain or moderate pain.    Marland Kitchen aspirin 325 MG tablet Take 1 tablet (325 mg total) by mouth daily. 30 tablet 5  . atorvastatin (LIPITOR) 20 MG tablet TAKE 1 TABLET EVERY DAY 90 tablet 0  . clonazePAM (KLONOPIN) 0.5 MG tablet Take one qhs prn sleep 30 tablet 5  . escitalopram (LEXAPRO) 20 MG tablet TAKE 1 TABLET EVERY DAY 90 tablet 0  . guaifenesin (ROBITUSSIN) 100 MG/5ML syrup Take 200 mg by mouth 4 (four) times daily as needed for cough.    Marland Kitchen lisinopril-hydrochlorothiazide (ZESTORETIC) 20-12.5 MG tablet TAKE 1 TABLET EVERY DAY 90 tablet 0  . metFORMIN (GLUCOPHAGE) 1000 MG tablet Take 1 tablet (1,000 mg total) by mouth 2 (two) times daily. 180 tablet 1  . ondansetron (ZOFRAN ODT) 4 MG disintegrating tablet Take 1 tablet (4 mg total) by mouth every 8 (eight) hours as needed for nausea or vomiting. 30 tablet 0  . oxyCODONE (OXY IR/ROXICODONE) 5 MG immediate release tablet Take 1 tablet (5 mg total) by mouth every 6 (six) hours as needed for severe pain. 15 tablet 0  . Phenyleph-CPM-DM-APAP (ALKA-SELTZER PLUS COLD & COUGH) 05-09-08-325 MG CAPS Take 1-2 capsules by mouth daily as needed (for cold relief).    . verapamil (CALAN-SR) 240 MG CR tablet TAKE 1 TABLET EVERY MORNING 90 tablet 0   No current facility-administered medications for this  visit.    PHYSICAL EXAMINATION: ECOG PERFORMANCE STATUS: 1 - Symptomatic but completely ambulatory  Vitals:   12/01/19 1506  BP: (!) 147/67  Pulse: 96  Resp: 18  Temp: 98.1 F (36.7 C)  SpO2: 100%   Filed Weights   12/01/19 1506  Weight: 267 lb 8 oz (121.3 kg)     LABORATORY DATA:  I have reviewed the data as listed CMP Latest Ref Rng & Units 10/27/2019 08/24/2019 07/21/2019  Glucose 70 - 99 mg/dL 145(H) - 146(H)  BUN 6  - 20 mg/dL 7 - 9  Creatinine 0.44 - 1.00 mg/dL 0.79 0.80 0.81  Sodium 135 - 145 mmol/L 134(L) - 135  Potassium 3.5 - 5.1 mmol/L 4.5 - 5.1  Chloride 98 - 111 mmol/L 98 - 97  CO2 22 - 32 mmol/L 26 - 24  Calcium 8.9 - 10.3 mg/dL 9.0 - 9.2  Total Protein 6.0 - 8.5 g/dL - - 6.5  Total Bilirubin 0.0 - 1.2 mg/dL - - 0.3  Alkaline Phos 48 - 121 IU/L - - 98  AST 0 - 40 IU/L - - 20  ALT 0 - 32 IU/L - - 25    Lab Results  Component Value Date   WBC 10.4 03/09/2018   HGB 13.9 03/09/2018   HCT 43.7 03/09/2018   MCV 96.0 03/09/2018   PLT 301 03/09/2018   NEUTROABS 8.9 (H) 03/09/2018    ASSESSMENT & PLAN:  Malignant neoplasm of upper-outer quadrant of right breast in female, estrogen receptor positive ([place]) 11/24/19: Right lumpectomy (Tsuei): IDC, 0.9cm, grade 2, involved superior margin, 5 right axillary lymph nodes negative for carcinoma. HER-2 negative (1+), ER+ 90%, PR+ 95%, Ki67 15%.  Pathology counseling: I discussed the final pathology report of the patient provided  a copy of this report. I discussed the margins as well as lymph node surgeries. We also discussed the final staging along with previously performed ER/PR and HER-2/neu testing.  Treatment Plan: 1. Resection of positive margin scheduled for December 15, 2019. 2. Oncotype DX testing to determine if chemotherapy would be of any benefit followed by 3. Adjuvant radiation therapy followed by 4. Adjuvant antiestrogen therapy  RTC based on oncotype results If she is low risk on Oncotype then we can see her after radiation is complete.  No orders of the defined types were placed in this encounter.  The patient has a good understanding of the overall plan. she agrees with it. she will call with any problems that may develop before the next visit here.  Total time spent: 30 mins including face to face time and time spent for planning, charting and coordination of care  Nicholas Lose, MD 12/01/2019  I, Cloyde Reams Dorshimer, am  acting as scribe for Dr. Nicholas Lose.  I have reviewed the above documentation for accuracy and completeness, and I agree with the above.

## 2019-12-01 ENCOUNTER — Other Ambulatory Visit: Payer: Self-pay

## 2019-12-01 ENCOUNTER — Inpatient Hospital Stay: Payer: Medicare HMO | Attending: Hematology and Oncology | Admitting: Hematology and Oncology

## 2019-12-01 DIAGNOSIS — Z79899 Other long term (current) drug therapy: Secondary | ICD-10-CM | POA: Diagnosis not present

## 2019-12-01 DIAGNOSIS — C50411 Malignant neoplasm of upper-outer quadrant of right female breast: Secondary | ICD-10-CM | POA: Insufficient documentation

## 2019-12-01 DIAGNOSIS — Z7984 Long term (current) use of oral hypoglycemic drugs: Secondary | ICD-10-CM | POA: Insufficient documentation

## 2019-12-01 DIAGNOSIS — Z170 Estrogen receptor positive status [ER+]: Secondary | ICD-10-CM | POA: Insufficient documentation

## 2019-12-01 NOTE — Assessment & Plan Note (Signed)
11/24/19: Right lumpectomy (Tsuei): IDC, 0.9cm, grade 2, involved superior margin, 5 right axillary lymph nodes negative for carcinoma. HER-2 negative (1+), ER+ 90%, PR+ 95%, Ki67 15%.  Pathology counseling: I discussed the final pathology report of the patient provided  a copy of this report. I discussed the margins as well as lymph node surgeries. We also discussed the final staging along with previously performed ER/PR and HER-2/neu testing.  Treatment Plan: 1. Resection of positive margin 2. Oncotype DX testing to determine if chemotherapy would be of any benefit followed by 3. Adjuvant radiation therapy followed by 4. Adjuvant antiestrogen therapy  RTC based on oncotype results    

## 2019-12-02 ENCOUNTER — Telehealth: Payer: Self-pay | Admitting: Hematology and Oncology

## 2019-12-02 NOTE — Telephone Encounter (Signed)
No 11/23 los, no changes made to pt schedule 

## 2019-12-08 DIAGNOSIS — Z170 Estrogen receptor positive status [ER+]: Secondary | ICD-10-CM | POA: Diagnosis not present

## 2019-12-08 DIAGNOSIS — C50411 Malignant neoplasm of upper-outer quadrant of right female breast: Secondary | ICD-10-CM | POA: Diagnosis not present

## 2019-12-10 ENCOUNTER — Encounter (HOSPITAL_COMMUNITY): Payer: Self-pay | Admitting: Hematology and Oncology

## 2019-12-11 ENCOUNTER — Encounter: Payer: Self-pay | Admitting: Hematology and Oncology

## 2019-12-11 ENCOUNTER — Encounter (HOSPITAL_COMMUNITY): Payer: Self-pay | Admitting: Surgery

## 2019-12-11 ENCOUNTER — Other Ambulatory Visit (HOSPITAL_COMMUNITY)
Admission: RE | Admit: 2019-12-11 | Discharge: 2019-12-11 | Disposition: A | Discharge status: Home / Self Care | Payer: Medicare HMO | Source: Ambulatory Visit | Attending: Surgery | Admitting: Surgery

## 2019-12-11 DIAGNOSIS — Z01812 Encounter for preprocedural laboratory examination: Secondary | ICD-10-CM | POA: Diagnosis not present

## 2019-12-11 DIAGNOSIS — Z20822 Contact with and (suspected) exposure to covid-19: Secondary | ICD-10-CM | POA: Diagnosis not present

## 2019-12-11 LAB — SARS CORONAVIRUS 2 (TAT 6-24 HRS): SARS Coronavirus 2: NEGATIVE

## 2019-12-11 NOTE — Progress Notes (Signed)
PCP:  Dr. Lovena Le at [place] Cardiologist:  Denies  EKG:  10/27/19 CXR:  09/15/19 ECHO:  07/22/13 Stress Test:  Denies Cardiac Cath:  Denies  Fasting Blood Sugar-  100-180 Checks Blood Sugar_1__ times a day  OSA/CPAP:  No  ASA: Last dose 11/09/19 Blood Thinner:  No  Covid test 12/11/19  Anesthesia Review:  No  Patient denies shortness of breath, fever, cough, and chest pain at PAT appointment.  Patient verbalized understanding of instructions provided today at the PAT appointment.  Patient asked to review instructions at home and day of surgery.

## 2019-12-14 ENCOUNTER — Encounter: Payer: Self-pay | Admitting: *Deleted

## 2019-12-14 ENCOUNTER — Telehealth: Payer: Self-pay | Admitting: *Deleted

## 2019-12-14 DIAGNOSIS — Z170 Estrogen receptor positive status [ER+]: Secondary | ICD-10-CM

## 2019-12-14 MED ORDER — DEXTROSE 5 % IV SOLN
3.0000 g | INTRAVENOUS | Status: DC
  Filled 2019-12-14: qty 3000

## 2019-12-14 NOTE — Telephone Encounter (Signed)
Received oncotype score of 14. Physician team notified. Called pt with results, discussed chemo not recommended. Received verbal understanding.  Informed pt that after re-excision, next step is xrt with Dr. Isidore Moos. Received verbal understanding. Encourage pt to call with needs.

## 2019-12-14 NOTE — Anesthesia Preprocedure Evaluation (Addendum)
Anesthesia Evaluation  Patient identified by MRN, date of birth, ID band Patient awake    Reviewed: Allergy & Precautions, NPO status , Patient's Chart, lab work & pertinent test results  Airway Mallampati: III  TM Distance: >3 FB Neck ROM: Full  Mouth opening: Limited Mouth Opening  Dental  (+) Edentulous Upper, Poor Dentition, Missing, Dental Advisory Given,    Pulmonary COPD (pt denies), Current Smoker and Patient abstained from smoking.,    Pulmonary exam normal breath sounds clear to auscultation       Cardiovascular hypertension, Pt. on medications Normal cardiovascular exam Rhythm:Regular Rate:Normal  HLD   Neuro/Psych  Headaches, PSYCHIATRIC DISORDERS Anxiety Depression CVA, No Residual Symptoms    GI/Hepatic negative GI ROS, Neg liver ROS,   Endo/Other  diabetes, Type 2, Oral Hypoglycemic AgentsMorbid obesity (BMI 44)  Renal/GU negative Renal ROS  negative genitourinary   Musculoskeletal  (+) Arthritis , S/p ACDF   Abdominal   Peds  Hematology negative hematology ROS (+)   Anesthesia Other Findings   Reproductive/Obstetrics                            Anesthesia Physical Anesthesia Plan  ASA: III  Anesthesia Plan: General   Post-op Pain Management:    Induction: Intravenous  PONV Risk Score and Plan: 2 and Ondansetron, Dexamethasone and Midazolam  Airway Management Planned: LMA  Additional Equipment:   Intra-op Plan:   Post-operative Plan: Extubation in OR  Informed Consent: I have reviewed the patients History and Physical, chart, labs and discussed the procedure including the risks, benefits and alternatives for the proposed anesthesia with the patient or authorized representative who has indicated his/her understanding and acceptance.     Dental advisory given  Plan Discussed with: CRNA  Anesthesia Plan Comments:         Anesthesia Quick Evaluation

## 2019-12-15 ENCOUNTER — Ambulatory Visit (HOSPITAL_COMMUNITY): Payer: Medicare HMO | Admitting: Certified Registered"

## 2019-12-15 ENCOUNTER — Encounter (HOSPITAL_COMMUNITY)
Admission: RE | Disposition: A | Discharge status: Home / Self Care | Payer: Self-pay | Source: Home / Self Care | Attending: Surgery

## 2019-12-15 ENCOUNTER — Ambulatory Visit (HOSPITAL_COMMUNITY)
Admission: RE | Admit: 2019-12-15 | Discharge: 2019-12-15 | Disposition: A | Discharge status: Home / Self Care | Payer: Medicare HMO | Attending: Surgery | Admitting: Surgery

## 2019-12-15 ENCOUNTER — Encounter (HOSPITAL_COMMUNITY): Payer: Self-pay | Admitting: Surgery

## 2019-12-15 ENCOUNTER — Other Ambulatory Visit: Payer: Self-pay

## 2019-12-15 DIAGNOSIS — D0511 Intraductal carcinoma in situ of right breast: Secondary | ICD-10-CM | POA: Diagnosis not present

## 2019-12-15 DIAGNOSIS — Z7982 Long term (current) use of aspirin: Secondary | ICD-10-CM | POA: Insufficient documentation

## 2019-12-15 DIAGNOSIS — C50911 Malignant neoplasm of unspecified site of right female breast: Secondary | ICD-10-CM | POA: Diagnosis not present

## 2019-12-15 DIAGNOSIS — Z170 Estrogen receptor positive status [ER+]: Secondary | ICD-10-CM | POA: Insufficient documentation

## 2019-12-15 DIAGNOSIS — Z79899 Other long term (current) drug therapy: Secondary | ICD-10-CM | POA: Insufficient documentation

## 2019-12-15 DIAGNOSIS — C50811 Malignant neoplasm of overlapping sites of right female breast: Secondary | ICD-10-CM | POA: Insufficient documentation

## 2019-12-15 DIAGNOSIS — G4700 Insomnia, unspecified: Secondary | ICD-10-CM | POA: Diagnosis not present

## 2019-12-15 DIAGNOSIS — F17210 Nicotine dependence, cigarettes, uncomplicated: Secondary | ICD-10-CM | POA: Diagnosis not present

## 2019-12-15 DIAGNOSIS — E785 Hyperlipidemia, unspecified: Secondary | ICD-10-CM | POA: Diagnosis not present

## 2019-12-15 DIAGNOSIS — Z7984 Long term (current) use of oral hypoglycemic drugs: Secondary | ICD-10-CM | POA: Diagnosis not present

## 2019-12-15 DIAGNOSIS — I10 Essential (primary) hypertension: Secondary | ICD-10-CM | POA: Diagnosis not present

## 2019-12-15 HISTORY — PX: RE-EXCISION OF BREAST CANCER,SUPERIOR MARGINS: SHX6047

## 2019-12-15 LAB — BASIC METABOLIC PANEL
Anion gap: 11 (ref 5–15)
BUN: 8 mg/dL (ref 6–20)
CO2: 24 mmol/L (ref 22–32)
Calcium: 9.4 mg/dL (ref 8.9–10.3)
Chloride: 97 mmol/L — ABNORMAL LOW (ref 98–111)
Creatinine, Ser: 0.84 mg/dL (ref 0.44–1.00)
GFR, Estimated: >60 mL/min (ref >60)
Glucose, Bld: 180 mg/dL — ABNORMAL HIGH (ref 70–99)
Potassium: 4.9 mmol/L (ref 3.5–5.1)
Sodium: 132 mmol/L — ABNORMAL LOW (ref 135–145)

## 2019-12-15 LAB — CBC
HCT: 39 % (ref 36.0–46.0)
Hemoglobin: 13.3 g/dL (ref 12.0–15.0)
MCH: 32.9 pg (ref 26.0–34.0)
MCHC: 34.1 g/dL (ref 30.0–36.0)
MCV: 96.5 fL (ref 80.0–100.0)
Platelets: 251 10*3/uL (ref 150–400)
RBC: 4.04 MIL/uL (ref 3.87–5.11)
RDW: 13 % (ref 11.5–15.5)
WBC: 6.3 10*3/uL (ref 4.0–10.5)
nRBC: 0 % (ref 0.0–0.2)

## 2019-12-15 LAB — GLUCOSE, CAPILLARY
Glucose-Capillary: 187 mg/dL — ABNORMAL HIGH (ref 70–99)
Glucose-Capillary: 149 mg/dL — ABNORMAL HIGH (ref 70–99)

## 2019-12-15 SURGERY — RE-EXCISION OF BREAST CANCER,SUPERIOR MARGINS
Anesthesia: General | Site: Breast | Laterality: Right

## 2019-12-15 MED ORDER — BUPIVACAINE HCL (PF) 0.25 % IJ SOLN
INTRAMUSCULAR | Status: AC
  Filled 2019-12-15: qty 30

## 2019-12-15 MED ORDER — PROPOFOL 10 MG/ML IV BOLUS
INTRAVENOUS | Status: DC | PRN
  Administered 2019-12-15: 200 mg via INTRAVENOUS

## 2019-12-15 MED ORDER — MIDAZOLAM HCL 2 MG/2ML IJ SOLN
INTRAMUSCULAR | Status: AC
  Filled 2019-12-15: qty 2

## 2019-12-15 MED ORDER — ACETAMINOPHEN 500 MG PO TABS
1000.0000 mg | ORAL_TABLET | Freq: Once | ORAL | Status: AC
  Administered 2019-12-15: 1000 mg via ORAL
  Filled 2019-12-15: qty 2

## 2019-12-15 MED ORDER — ORAL CARE MOUTH RINSE: 15.0000 mL | Freq: Once | OROMUCOSAL | Status: AC

## 2019-12-15 MED ORDER — ONDANSETRON HCL 4 MG/2ML IJ SOLN
INTRAMUSCULAR | Status: DC | PRN
  Administered 2019-12-15: 4 mg via INTRAVENOUS

## 2019-12-15 MED ORDER — MIDAZOLAM HCL 5 MG/5ML IJ SOLN
INTRAMUSCULAR | Status: DC | PRN
  Administered 2019-12-15: 2 mg via INTRAVENOUS

## 2019-12-15 MED ORDER — FENTANYL CITRATE (PF) 250 MCG/5ML IJ SOLN
INTRAMUSCULAR | Status: AC
  Filled 2019-12-15: qty 5

## 2019-12-15 MED ORDER — GABAPENTIN 300 MG PO CAPS
300.0000 mg | ORAL_CAPSULE | ORAL | Status: AC
  Administered 2019-12-15: 300 mg via ORAL
  Filled 2019-12-15: qty 1

## 2019-12-15 MED ORDER — LIDOCAINE HCL (CARDIAC) PF 100 MG/5ML IV SOSY
PREFILLED_SYRINGE | INTRAVENOUS | Status: DC | PRN
  Administered 2019-12-15: 60 mg via INTRAVENOUS

## 2019-12-15 MED ORDER — LACTATED RINGERS IV SOLN: INTRAVENOUS | Status: DC

## 2019-12-15 MED ORDER — CHLORHEXIDINE GLUCONATE 0.12 % MT SOLN
15.0000 mL | Freq: Once | OROMUCOSAL | Status: AC
  Administered 2019-12-15: 15 mL via OROMUCOSAL
  Filled 2019-12-15: qty 15

## 2019-12-15 MED ORDER — FENTANYL CITRATE (PF) 250 MCG/5ML IJ SOLN
INTRAMUSCULAR | Status: DC | PRN
  Administered 2019-12-15: 100 ug via INTRAVENOUS

## 2019-12-15 MED ORDER — DEXTROSE 5 % IV SOLN
INTRAVENOUS | Status: DC | PRN
  Administered 2019-12-15: 3 g via INTRAVENOUS

## 2019-12-15 MED ORDER — BUPIVACAINE-EPINEPHRINE 0.25% -1:200000 IJ SOLN
INTRAMUSCULAR | Status: DC | PRN
  Administered 2019-12-15: 10 mL

## 2019-12-15 MED ORDER — CHLORHEXIDINE GLUCONATE CLOTH 2 % EX PADS: 6.0000 | MEDICATED_PAD | Freq: Once | CUTANEOUS | Status: DC

## 2019-12-15 MED ORDER — FENTANYL CITRATE (PF) 100 MCG/2ML IJ SOLN: 25.0000 ug | INTRAMUSCULAR | Status: DC | PRN

## 2019-12-15 MED ORDER — ACETAMINOPHEN 500 MG PO TABS: 1000.0000 mg | ORAL_TABLET | ORAL | Status: DC

## 2019-12-15 MED ORDER — DEXAMETHASONE SODIUM PHOSPHATE 10 MG/ML IJ SOLN
INTRAMUSCULAR | Status: DC | PRN
  Administered 2019-12-15: 4 mg via INTRAVENOUS

## 2019-12-15 SURGICAL SUPPLY — 47 items
APPLIER CLIP 9.375 MED OPEN (MISCELLANEOUS)
BENZOIN TINCTURE PRP APPL 2/3 (GAUZE/BANDAGES/DRESSINGS) ×3 IMPLANT
BINDER BREAST LRG (GAUZE/BANDAGES/DRESSINGS) IMPLANT
BINDER BREAST XLRG (GAUZE/BANDAGES/DRESSINGS) IMPLANT
BLADE CLIPPER SURG (BLADE) IMPLANT
CHLORAPREP W/TINT 26 (MISCELLANEOUS) ×3 IMPLANT
CLIP APPLIE 9.375 MED OPEN (MISCELLANEOUS) IMPLANT
CLIP VESOCCLUDE SM WIDE 24/CT (CLIP) IMPLANT
CLOSURE WOUND 1/2 X4 (GAUZE/BANDAGES/DRESSINGS) ×1
CNTNR URN SCR LID CUP LEK RST (MISCELLANEOUS) ×1 IMPLANT
CONT SPEC 4OZ STRL OR WHT (MISCELLANEOUS) ×2
COVER SURGICAL LIGHT HANDLE (MISCELLANEOUS) ×3 IMPLANT
COVER WAND RF STERILE (DRAPES) ×3 IMPLANT
DECANTER SPIKE VIAL GLASS SM (MISCELLANEOUS) IMPLANT
DERMABOND ADVANCED (GAUZE/BANDAGES/DRESSINGS) ×2
DERMABOND ADVANCED .7 DNX12 (GAUZE/BANDAGES/DRESSINGS) ×1 IMPLANT
DRAPE LAPAROTOMY TRNSV 102X78 (DRAPES) ×3 IMPLANT
DRSG TEGADERM 4X4.75 (GAUZE/BANDAGES/DRESSINGS) ×6 IMPLANT
ELECT CAUTERY BLADE 6.4 (BLADE) IMPLANT
ELECT REM PT RETURN 9FT ADLT (ELECTROSURGICAL) ×3
ELECTRODE REM PT RTRN 9FT ADLT (ELECTROSURGICAL) ×1 IMPLANT
GAUZE SPONGE 4X4 12PLY STRL (GAUZE/BANDAGES/DRESSINGS) ×3 IMPLANT
GAUZE SPONGE 4X4 12PLY STRL LF (GAUZE/BANDAGES/DRESSINGS) ×3 IMPLANT
GLOVE BIO SURGEON STRL SZ7 (GLOVE) ×3 IMPLANT
GLOVE BIOGEL PI IND STRL 7.5 (GLOVE) ×1 IMPLANT
GLOVE BIOGEL PI INDICATOR 7.5 (GLOVE) ×2
GOWN STRL REUS W/ TWL LRG LVL3 (GOWN DISPOSABLE) ×2 IMPLANT
GOWN STRL REUS W/TWL LRG LVL3 (GOWN DISPOSABLE) ×6
KIT BASIN OR (CUSTOM PROCEDURE TRAY) ×3 IMPLANT
KIT MARKER MARGIN INK (KITS) ×3 IMPLANT
KIT TURNOVER KIT B (KITS) ×3 IMPLANT
LIGHT WAVEGUIDE WIDE FLAT (MISCELLANEOUS) ×3 IMPLANT
NEEDLE HYPO 25GX1X1/2 BEV (NEEDLE) ×3 IMPLANT
NS IRRIG 1000ML POUR BTL (IV SOLUTION) ×3 IMPLANT
PACK GENERAL/GYN (CUSTOM PROCEDURE TRAY) ×3 IMPLANT
PAD ARMBOARD 7.5X6 YLW CONV (MISCELLANEOUS) ×6 IMPLANT
PENCIL SMOKE EVACUATOR (MISCELLANEOUS) ×3 IMPLANT
SPECIMEN JAR MEDIUM (MISCELLANEOUS) ×6 IMPLANT
SPONGE LAP 4X18 RFD (DISPOSABLE) ×3 IMPLANT
STRIP CLOSURE SKIN 1/2X4 (GAUZE/BANDAGES/DRESSINGS) ×2 IMPLANT
SUT MNCRL AB 4-0 PS2 18 (SUTURE) ×3 IMPLANT
SUT VIC AB 3-0 SH 27 (SUTURE) ×3
SUT VIC AB 3-0 SH 27X BRD (SUTURE) ×1 IMPLANT
SUT VIC AB 3-0 SH 8-18 (SUTURE) ×3 IMPLANT
SYR CONTROL 10ML LL (SYRINGE) ×3 IMPLANT
TOWEL GREEN STERILE (TOWEL DISPOSABLE) ×3 IMPLANT
TOWEL GREEN STERILE FF (TOWEL DISPOSABLE) ×3 IMPLANT

## 2019-12-15 NOTE — Anesthesia Procedure Notes (Signed)
Procedure Name: LMA Insertion Date/Time: 12/15/2019 8:40 AM Performed by: Gaylene Brooks, CRNA Pre-anesthesia Checklist: Patient identified, Emergency Drugs available, Suction available and Patient being monitored Patient Re-evaluated:Patient Re-evaluated prior to induction Oxygen Delivery Method: Circle System Utilized Preoxygenation: Pre-oxygenation with 100% oxygen Induction Type: IV induction Ventilation: Mask ventilation without difficulty LMA: LMA inserted LMA Size: 4.0 Number of attempts: 1 Airway Equipment and Method: Bite block Placement Confirmation: positive ETCO2 Tube secured with: Tape Dental Injury: Teeth and Oropharynx as per pre-operative assessment

## 2019-12-15 NOTE — Transfer of Care (Signed)
Immediate Anesthesia Transfer of Care Note  Patient: Shannon Price  Procedure(s) Performed: RE-EXCISION OF ANTERIOR AND SUPERIOR MARGINS RIGHT BREAST LUMPECTOMY (Right Breast)  Patient Location: PACU  Anesthesia Type:General  Level of Consciousness: awake, alert  and oriented  Airway & Oxygen Therapy: Patient Spontanous Breathing and Patient connected to nasal cannula oxygen  Post-op Assessment: Report given to RN, Post -op Vital signs reviewed and stable and Patient moving all extremities X 4  Post vital signs: Reviewed and stable  Last Vitals:  Vitals Value Taken Time  BP 146/81 12/15/19 0938  Temp    Pulse 96 12/15/19 0940  Resp 21 12/15/19 0940  SpO2 99 % 12/15/19 0940  Vitals shown include unvalidated device data.  Last Pain:  Vitals:   12/15/19 0712  TempSrc:   PainSc: 0-No pain      Patients Stated Pain Goal: 6 (27/03/50 0938)  Complications: No complications documented.

## 2019-12-15 NOTE — Op Note (Signed)
Preop diagnosis: Invasive ductal carcinoma right breast Postop diagnosis: Same Procedure performed: Reexcision of anterior and superior margin right lumpectomy site Surgeon:Yoana Staib K Decklyn Hornik Anesthesia: General Indications: This is a 59 year old female with invasive ductal carcinoma of the right breast measuring 1.5 x 1.1 x 0.8 cm with a second adjacent mass.  On 11/24/2019 she underwent right radioactive seed localized lumpectomy.  The superior margin as well as the anterior margin were positive.  The anterior margin was the back of the skin.  She presents now for reexcision.  We plan to excise some of the overlying skin.  Description of procedure: The patient is brought to the operating room and placed in the supine position on the operating room table.  After an adequate level of general anesthesia was obtained, her right breast was prepped with ChloraPrep and draped in sterile fashion.  A timeout was taken to ensure the proper patient and proper procedure.  Using a skin marker, I outlined an elliptical incision using her previous incision as the lower edge of our ellipse.  I raised the skin incision after infiltrating with local anesthetic.  The entire anterior margin was excised in 1 piece and oriented with a paint kit.  This was sent for pathologic examination.  I then reexcised a 1 cm thickness of the superior margin which was also oriented.  We irrigated thoroughly and inspected for hemostasis.  I then closed our incision with multiple interrupted 3-0 Vicryl sutures.  4-0 Monocryl was used to close the skin.  Benzoin and Steri-Strips were applied.  Patient was then extubated and brought to recovery room in stable condition.  All sponge, instrument, and needle counts are correct.  [name]. [name], MD, [facility]  General/ Trauma Surgery   12/15/2019 10:01 AM

## 2019-12-15 NOTE — Interval H&P Note (Signed)
History and Physical Interval Note:  12/15/2019 6:55 AM   Previous lumpectomy revealed positive superior/anterior margins.  She presents for reexision.  [name]  has presented today for surgery, with the diagnosis of RIGHT INVASIVE DUCTAL CARCINOMA.  The various methods of treatment have been discussed with the patient and family. After consideration of risks, benefits and other options for treatment, the patient has consented to  Procedure(s) with comments: RE-EXCISION OF ANTERIOR AND SUPERIOR MARGINS RIGHT BREAST LUMPECTOMY (Right) - LMA as a surgical intervention.  The patient's history has been reviewed, patient examined, no change in status, stable for surgery.  I have reviewed the patient's chart and labs.  Questions were answered to the patient's satisfaction.     Shannon Price

## 2019-12-16 ENCOUNTER — Encounter (HOSPITAL_COMMUNITY): Payer: Self-pay | Admitting: Surgery

## 2019-12-16 NOTE — Anesthesia Postprocedure Evaluation (Signed)
Anesthesia Post Note  Patient: Shannon Price  Procedure(s) Performed: RE-EXCISION OF ANTERIOR AND SUPERIOR MARGINS RIGHT BREAST LUMPECTOMY (Right Breast)     Patient location during evaluation: PACU Anesthesia Type: General Level of consciousness: awake and alert Pain management: pain level controlled Vital Signs Assessment: post-procedure vital signs reviewed and stable Respiratory status: spontaneous breathing, nonlabored ventilation, respiratory function stable and patient connected to nasal cannula oxygen Cardiovascular status: blood pressure returned to baseline and stable Postop Assessment: no apparent nausea or vomiting Anesthetic complications: no   No complications documented.  Last Vitals:  Vitals:   12/15/19 0953 12/15/19 0959  BP: 132/76 (!) 145/78  Pulse: 85 85  Resp: 17 18  Temp:  36.5 C  SpO2: 97% 97%    Last Pain:  Vitals:   12/15/19 0712  TempSrc:   PainSc: 0-No pain                 Shannon Price

## 2019-12-17 LAB — SURGICAL PATHOLOGY

## 2019-12-21 ENCOUNTER — Encounter: Payer: Self-pay | Admitting: *Deleted

## 2020-01-12 NOTE — Progress Notes (Incomplete)
Location of Breast Cancer: Malignant neoplasm of upper-outer quadrant of RIGHT breast, estrogen receptor positive   Histology per Pathology Report:  12/15/2019 FINAL MICROSCOPIC DIAGNOSIS:  A. BREAST, RIGHT ANTERIOR MARGIN, RE-EXCISION:  - Prior procedure site changes. No carcinoma identified.  B. BREAST, RIGHT SUPERIOR MARGIN, RE-EXCISION:  - Prior procedure site changes. No carcinoma identified.   11/24/2019 FINAL MICROSCOPIC DIAGNOSIS:  A. BREAST, RIGHT, LUMPECTOMY:  - Invasive ductal carcinoma with extracellular mucin, 0.9 cm, Nottingham grade 2 of 3.  - Invasive carcinoma broadly involves the superior margin.  - Biopsy site.  - See oncology table.  B. BREAST, RIGHT LATERAL INFERIOR, LUMPECTOMY:  - Benign breast tissue.  - Biopsy site.  C. SENTINEL LYMPH NODE, RIGHT AXILLARY #1, BIOPSY:  - One lymph node, negative for carcinoma (0/1).  D. SENTINEL LYMPH NODE, RIGHT AXILLARY #1, BIOPSY:  - One lymph node, negative for carcinoma (0/1).  E. SENTINEL LYMPH NODE, RIGHT AXILLARY #2, BIOPSY:  - One lymph node, negative for carcinoma (0/1).  F. SENTINEL LYMPH NODE, RIGHT AXILLARY #3, BIOPSY:  - One lymph node, negative for carcinoma (0/1).  G. SENTINEL LYMPH NODE, RIGHT AXILLARY #3, BIOPSY:  - One lymph node, negative forcarcinoma (0/1).   Receptor Status: ER(90%), PR (95%), Her2-neu (Negative), Ki-67(15%)  Did patient present with symptoms (if so, please note symptoms) or was this found on screening mammography?:  Screening mammogram showed possible right breast masses. Diagnostic mammogram and US showed a 1.5cm mass at the 12 o'clock position, a 0.7cm mass in the outer right breast, a shadowing benign calcification at the 1 o'clock position, and a cluster of cysts at the 9 o'clock position, with no right axillary adenopathy.  Past/Anticipated interventions by surgeon, if any: 12/15/2019 Dr. Donnie Mesa Reexcision of anterior and superior margin right lumpectomy  site  11/24/2019 Dr. Donnie Mesa Right breast lumpectomy x2, Sentinel lymph node biopsy, blue dye injection  Past/Anticipated interventions by medical oncology, if any:  Under care of Dr. Nicholas Lose 12/01/2019 --Treatment Plan: 1. Resection of positive margin scheduled for December 15, 2019. 2. Oncotype DX testing to determine if chemotherapy would be of any benefit followed by 3. Adjuvant radiation therapy followed by 4. Adjuvant antiestrogen therapy --RTC based on oncotype results. If she is low risk on Oncotype then we can see her after radiation is complete.  Lymphedema issues, if any:  ***    Pain issues, if any:  ***   SAFETY ISSUES:  Prior radiation? No  Pacemaker/ICD? No  Possible current pregnancy? No--postmenopausal  Is the patient on methotrexate? No  Current Complaints / other details:  ***

## 2020-01-13 ENCOUNTER — Ambulatory Visit: Payer: Medicare HMO

## 2020-01-13 ENCOUNTER — Ambulatory Visit: Payer: Medicare HMO | Admitting: Radiation Oncology

## 2020-01-13 ENCOUNTER — Ambulatory Visit
Admission: RE | Admit: 2020-01-13 | Discharge: 2020-01-13 | Disposition: A | Discharge status: Home / Self Care | Payer: Medicare HMO | Source: Ambulatory Visit | Attending: Radiation Oncology | Admitting: Radiation Oncology

## 2020-01-13 NOTE — Progress Notes (Incomplete)
Radiation Oncology         [phone] ________________________________  Name: Shannon Price MRN: [id number]  Date: 01/13/2020  DOB: 06/23/1960  Follow-Up Visit Note  Outpatient  CC: Taylor, Malena M, DO  Gudena, Vinay, MD  Diagnosis:   No diagnosis found. ***  CHIEF COMPLAINT: Here to discuss management of right breast cancer  Narrative:  The patient returns today for follow-up. She was seen in consultation on 10/21/2019, during which time it was recommended that she proceed with breast conserving surgery followed by adjuvant radiation therapy.   She has not undergone any significant imaging studies since consultation.  Oncotype DX was obtained on the final surgical sample and the recurrence score of 14 predicted a risk of recurrence outside the breast over the next 9 years of 4%, if the patient's only systemic therapy is an antiestrogen for 5 years.  It also predicted no significant benefit from chemotherapy.  Breast/nodal surgery on the date of 11/24/2019 revealed: tumor size of 0.9 cm; histology of invasive ductal carcinoma with extracellular mucin; margin status to invasive disease - broad involvement of the superior margin; nodal status of negative (0/5); ER status: 90% strong; PR status: 95% strong; Her2 status: negative; Grade: 2.  Re-excision of the anterior and superior margins of the right lumpectomy site was performed on the date of 12/15/2019. Both margins showed prior procedure site changes without carcinoma.  Symptomatically, the patient reports: ***        ALLERGIES:  has No Known Allergies.  Meds: Current Outpatient Medications  Medication Sig Dispense Refill  . acetaminophen (TYLENOL) 500 MG tablet Take 1,000 mg by mouth every 6 (six) hours as needed for mild pain or moderate pain.     . aspirin 325 MG tablet Take 1 tablet (325 mg total) by mouth daily. 30 tablet 5  . atorvastatin (LIPITOR) 20 MG tablet TAKE 1 TABLET EVERY DAY (Patient taking differently: Take 20  mg by mouth daily. ) 90 tablet 0  . clonazePAM (KLONOPIN) 0.5 MG tablet Take one qhs prn sleep (Patient taking differently: Take 0.5 mg by mouth at bedtime as needed (sleep). ) 30 tablet 5  . escitalopram (LEXAPRO) 20 MG tablet TAKE 1 TABLET EVERY DAY (Patient taking differently: Take 20 mg by mouth daily. ) 90 tablet 0  . guaifenesin (ROBITUSSIN) 100 MG/5ML syrup Take 200 mg by mouth 4 (four) times daily as needed for cough (cough/allergies/cold symptoms).     . lisinopril-hydrochlorothiazide (ZESTORETIC) 20-12.5 MG tablet TAKE 1 TABLET EVERY DAY (Patient taking differently: Take 1 tablet by mouth daily. ) 90 tablet 0  . metFORMIN (GLUCOPHAGE) 1000 MG tablet Take 1 tablet (1,000 mg total) by mouth 2 (two) times daily. 180 tablet 1  . ondansetron (ZOFRAN ODT) 4 MG disintegrating tablet Take 1 tablet (4 mg total) by mouth every 8 (eight) hours as needed for nausea or vomiting. 30 tablet 0  . oxyCODONE (OXY IR/ROXICODONE) 5 MG immediate release tablet Take 1 tablet (5 mg total) by mouth every 6 (six) hours as needed for severe pain. (Patient not taking: Reported on 12/08/2019) 15 tablet 0  . Phenyleph-CPM-DM-APAP (ALKA-SELTZER PLUS COLD & COUGH) 05-09-08-325 MG CAPS Take 1-2 capsules by mouth daily as needed (allergies/cold symptoms).     . tetrahydrozoline 0.05 % ophthalmic solution Place 1-2 drops into both eyes 3 (three) times daily as needed (irritated/dry eyes.).    . verapamil (CALAN-SR) 240 MG CR tablet TAKE 1 TABLET EVERY MORNING (Patient taking differently: Take 240 mg by mouth   daily. ) 90 tablet 0   No current facility-administered medications for this encounter.    Physical Findings:  vitals were not taken for this visit. .     General: Alert and oriented, in no acute distress HEENT: Head is normocephalic. Extraocular movements are intact. Oropharynx is clear. Neck: Neck is supple, no palpable cervical or supraclavicular lymphadenopathy. Heart: Regular in rate and rhythm with no murmurs,  rubs, or gallops. Chest: Clear to auscultation bilaterally, with no rhonchi, wheezes, or rales. Abdomen: Soft, nontender, nondistended, with no rigidity or guarding. Extremities: No cyanosis or edema. Lymphatics: see Neck Exam Musculoskeletal: symmetric strength and muscle tone throughout. Neurologic: No obvious focalities. Speech is fluent.  Psychiatric: Judgment and insight are intact. Affect is appropriate. Breast exam reveals ***  Lab Findings: Lab Results  Component Value Date   WBC 6.3 12/15/2019   HGB 13.3 12/15/2019   HCT 39.0 12/15/2019   MCV 96.5 12/15/2019   PLT 251 12/15/2019    _0 @  Radiographic Findings: No results found.  Impression/Plan: Right breast cancer  We discussed adjuvant radiotherapy today.  I recommend *** in order to ***.  I reviewed the logistics, benefits, risks, and potential side effects of this treatment in detail. Risks may include but not necessary be limited to acute and late injury tissue in the radiation fields such as skin irritation (change in color/pigmentation, itching, dryness, pain, peeling). She may experience fatigue. We also discussed possible risk of long term cosmetic changes or scar tissue. There is also a smaller risk for lung toxicity, ***cardiac toxicity, ***brachial plexopathy, ***lymphedema, ***musculoskeletal changes, ***rib fragility or ***induction of a second malignancy, ***late chronic non-healing soft tissue wound.    The patient asked good questions which I answered to her satisfaction. She is enthusiastic about proceeding with treatment. A consent form has been *** signed and placed in her chart.  A total of *** medically necessary complex treatment devices will be fabricated and supervised by me: *** fields with MLCs for custom blocks to protect heart, and lungs;  and, a Vac-lok. MORE COMPLEX DEVICES MAY BE MADE IN DOSIMETRY FOR FIELD IN FIELD BEAMS FOR DOSE HOMOGENEITY.  I have requested : 3D Simulation which  is medically necessary to give adequate dose to at risk tissues while sparing lungs and heart.  I have requested a DVH of the following structures: lungs, heart, *** lumpectomy cavity.    The patient will receive *** Gy in *** fractions to the *** with *** fields.  This will be *** followed by a boost.  On date of service, in total, I spent *** minutes on this encounter. Patient was seen in person.  _____________________________________   Eppie Gibson, MD  This document serves as a record of services personally performed by Eppie Gibson, MD. It was created on his behalf by Clerance Lav, a trained medical scribe. The creation of this record is based on the scribe's personal observations and the provider's statements to them. This document has been checked and approved by the attending provider.

## 2020-01-18 ENCOUNTER — Encounter: Payer: Self-pay | Admitting: *Deleted

## 2020-01-21 NOTE — Progress Notes (Signed)
Location of Breast Cancer: Malignant neoplasm of upper-outer quadrant of RIGHT breast, estrogen receptor positive   Histology per Pathology Report:  12/15/2019 FINAL MICROSCOPIC DIAGNOSIS:  A. BREAST, RIGHT ANTERIOR MARGIN, RE-EXCISION:  - Prior procedure site changes. No carcinoma identified.  B. BREAST, RIGHT SUPERIOR MARGIN, RE-EXCISION:  - Prior procedure site changes. No carcinoma identified.   11/24/2019 FINAL MICROSCOPIC DIAGNOSIS:  A. BREAST, RIGHT, LUMPECTOMY:  - Invasive ductal carcinoma with extracellular mucin, 0.9 cm, Nottingham grade 2 of 3.  - Invasive carcinoma broadly involves the superior margin.  - Biopsy site.  - See oncology table.  B. BREAST, RIGHT LATERAL INFERIOR, LUMPECTOMY:  - Benign breast tissue.  - Biopsy site.  C. SENTINEL LYMPH NODE, RIGHT AXILLARY #1, BIOPSY:  - One lymph node, negative for carcinoma (0/1).  D. SENTINEL LYMPH NODE, RIGHT AXILLARY #1, BIOPSY:  - One lymph node, negative for carcinoma (0/1).  E. SENTINEL LYMPH NODE, RIGHT AXILLARY #2, BIOPSY:  - One lymph node, negative for carcinoma (0/1).  F. SENTINEL LYMPH NODE, RIGHT AXILLARY #3, BIOPSY:  - One lymph node, negative for carcinoma (0/1).  G. SENTINEL LYMPH NODE, RIGHT AXILLARY #3, BIOPSY:  - One lymph node, negative forcarcinoma (0/1).   Receptor Status: ER(90%), PR (95%), Her2-neu (Negative), Ki-67(15%)  Did patient present with symptoms (if so, please note symptoms) or was this found on screening mammography?:  Screening mammogram showed possible right breast masses. Diagnostic mammogram and US showed a 1.5cm mass at the 12 o'clock position, a 0.7cm mass in the outer right breast, a shadowing benign calcification at the 1 o'clock position, and a cluster of cysts at the 9 o'clock position, with no right axillary adenopathy.  Past/Anticipated interventions by surgeon, if any: 12/15/2019 Dr. Donnie Mesa Reexcision of anterior and superior margin right lumpectomy  site  11/24/2019 Dr. Donnie Mesa Right breast lumpectomy x2, Sentinel lymph node biopsy, blue dye injection  Past/Anticipated interventions by medical oncology, if any:  Under care of Dr. Nicholas Lose 12/01/2019 --Treatment Plan: 1. Resection of positive margin scheduled for December 15, 2019. 2. Oncotype DX testing to determine if chemotherapy would be of any benefit followed by 3. Adjuvant radiation therapy followed by 4. Adjuvant antiestrogen therapy --RTC based on oncotype results. If she is low risk on Oncotype then we can see her after radiation is complete.  Lymphedema issues, if any: No  Pain issues, if any: Occasional pains.  Under arm is still numb feeling.  SAFETY ISSUES:  Prior radiation?No  Pacemaker/ICD?No  Possible current pregnancy?No--postmenopausal  Is the patient on methotrexate?No  Current Complaints / other details:

## 2020-01-22 ENCOUNTER — Encounter: Payer: Self-pay | Admitting: Radiation Oncology

## 2020-01-22 ENCOUNTER — Inpatient Hospital Stay: Payer: Medicare HMO | Attending: Hematology and Oncology

## 2020-01-22 ENCOUNTER — Ambulatory Visit
Admission: RE | Admit: 2020-01-22 | Discharge: 2020-01-22 | Disposition: A | Discharge status: Home / Self Care | Payer: Medicare HMO | Source: Ambulatory Visit | Attending: Radiation Oncology | Admitting: Radiation Oncology

## 2020-01-22 ENCOUNTER — Other Ambulatory Visit: Payer: Self-pay

## 2020-01-22 VITALS — BP 158/89 | HR 96 | Temp 97.8°F | Resp 20 | Ht 65.0 in | Wt 266.5 lb

## 2020-01-22 DIAGNOSIS — C50411 Malignant neoplasm of upper-outer quadrant of right female breast: Secondary | ICD-10-CM | POA: Insufficient documentation

## 2020-01-22 DIAGNOSIS — Z7984 Long term (current) use of oral hypoglycemic drugs: Secondary | ICD-10-CM | POA: Insufficient documentation

## 2020-01-22 DIAGNOSIS — Z170 Estrogen receptor positive status [ER+]: Secondary | ICD-10-CM

## 2020-01-22 DIAGNOSIS — Z7982 Long term (current) use of aspirin: Secondary | ICD-10-CM | POA: Insufficient documentation

## 2020-01-22 DIAGNOSIS — F17210 Nicotine dependence, cigarettes, uncomplicated: Secondary | ICD-10-CM | POA: Diagnosis not present

## 2020-01-22 DIAGNOSIS — Z23 Encounter for immunization: Secondary | ICD-10-CM | POA: Insufficient documentation

## 2020-01-22 DIAGNOSIS — Z510 Encounter for antineoplastic radiation therapy: Secondary | ICD-10-CM | POA: Diagnosis not present

## 2020-01-22 NOTE — Progress Notes (Signed)
   Covid-19 Vaccination Clinic  Name:  Shannon Price    MRN: [id number] DOB: 1960/06/30  01/22/2020  Shannon Price was observed post Covid-19 immunization for 15 minutes without incident. She was provided with Vaccine Information Sheet and instruction to access the V-Safe system.   Shannon Price was instructed to call 911 with any severe reactions post vaccine: Marland Kitchen Difficulty breathing  . Swelling of face and throat  . A fast heartbeat  . A bad rash all over body  . Dizziness and weakness   Immunizations Administered    Name Date Dose VIS Date Route   Pfizer COVID-19 Vaccine 01/22/2020 10:45 AM 0.3 mL 10/28/2019 Intramuscular   Manufacturer: [place]   Lot: [id number]   NDC: [address]-8

## 2020-01-22 NOTE — Progress Notes (Signed)
Radiation Oncology         [phone] ________________________________  Name: Shannon Price MRN: [id number]  Date: 01/22/2020  DOB: 03/13/1960  Follow-Up Visit Note  Outpatient  CC: Erven Colla, DO  Nicholas Lose, MD  Diagnosis:      ICD-10-CM   1. Malignant neoplasm of upper-outer quadrant of right breast in female, estrogen receptor positive ([place])  C50.411    Z17.0     Cancer Staging Malignant neoplasm of upper-outer quadrant of right breast in female, estrogen receptor positive ([place]) Staging form: Breast, AJCC 8th Edition - Clinical stage from 10/21/2019: Stage IA (cT1c, cN0, cM0, G1, ER+, PR+, HER2-) - Signed by Nicholas Lose, MD on 10/21/2019 pT1b, pN0  CHIEF COMPLAINT: Here to discuss management of right breast cancer  Narrative:  The patient returns today for follow-up. She was seen in consultation on 10/21/2019, during which time it was recommended that she proceed with breast conserving surgery followed by adjuvant radiation therapy.   She has not undergone any significant imaging studies since consultation.  Oncotype DX was obtained on the final surgical sample and the recurrence score of 14 predicted a risk of recurrence outside the breast over the next 9 years of 4%, if the patient's only systemic therapy is an antiestrogen for 5 years.  It also predicted no significant benefit from chemotherapy.  Breast/nodal surgery on the date of 11/24/2019 revealed: tumor size of 0.9 cm; histology of invasive ductal carcinoma with extracellular mucin; margin status to invasive disease - broad involvement of the superior margin; nodal status of negative (0/5); ER status: 90% strong; PR status: 95% strong; Her2 status: negative; Grade: 2.  Re-excision of the anterior and superior margins of the right lumpectomy site was performed on the date of 12/15/2019. Both margins showed prior procedure site changes without carcinoma.  Symptomatically, the patient reports:  Lymphedema  issues, if any: No  Pain issues, if any: Occasional pains.  Under arm is still numb feeling.  SAFETY ISSUES:  Prior radiation?No  Pacemaker/ICD?No  Possible current pregnancy?No--postmenopausal  Is the patient on methotrexate?No  Current Complaints / other details:  due for COVID 19 booster        ALLERGIES:  has No Known Allergies.  Meds: Current Outpatient Medications  Medication Sig Dispense Refill  . acetaminophen (TYLENOL) 500 MG tablet Take 1,000 mg by mouth every 6 (six) hours as needed for mild pain or moderate pain.     Marland Kitchen aspirin 325 MG tablet Take 1 tablet (325 mg total) by mouth daily. 30 tablet 5  . atorvastatin (LIPITOR) 20 MG tablet TAKE 1 TABLET EVERY DAY (Patient taking differently: Take 20 mg by mouth daily.) 90 tablet 0  . clonazePAM (KLONOPIN) 0.5 MG tablet Take one qhs prn sleep (Patient taking differently: Take 0.5 mg by mouth at bedtime as needed (sleep).) 30 tablet 5  . escitalopram (LEXAPRO) 20 MG tablet TAKE 1 TABLET EVERY DAY (Patient taking differently: Take 20 mg by mouth daily.) 90 tablet 0  . guaifenesin (ROBITUSSIN) 100 MG/5ML syrup Take 200 mg by mouth 4 (four) times daily as needed for cough (cough/allergies/cold symptoms).     Marland Kitchen lisinopril-hydrochlorothiazide (ZESTORETIC) 20-12.5 MG tablet TAKE 1 TABLET EVERY DAY (Patient taking differently: Take 1 tablet by mouth daily.) 90 tablet 0  . metFORMIN (GLUCOPHAGE) 1000 MG tablet Take 1 tablet (1,000 mg total) by mouth 2 (two) times daily. 180 tablet 1  . ondansetron (ZOFRAN ODT) 4 MG disintegrating tablet Take 1 tablet (4 mg total) by  mouth every 8 (eight) hours as needed for nausea or vomiting. 30 tablet 0  . Phenyleph-CPM-DM-APAP 05-09-08-325 MG CAPS Take 1-2 capsules by mouth daily as needed (allergies/cold symptoms).     Marland Kitchen tetrahydrozoline 0.05 % ophthalmic solution Place 1-2 drops into both eyes 3 (three) times daily as needed (irritated/dry eyes.).    Marland Kitchen verapamil (CALAN-SR) 240 MG CR tablet TAKE  1 TABLET EVERY MORNING (Patient taking differently: Take 240 mg by mouth daily.) 90 tablet 0  . oxyCODONE (OXY IR/ROXICODONE) 5 MG immediate release tablet Take 1 tablet (5 mg total) by mouth every 6 (six) hours as needed for severe pain. (Patient not taking: No sig reported) 15 tablet 0   No current facility-administered medications for this encounter.    Physical Findings:  height is _0  (1.651 m) and weight is 266 lb 8 oz (120.9 kg). Her temporal temperature is 97.8 F (36.6 C). Her blood pressure is 158/89 (abnormal) and her pulse is 96. Her respiration is 20 and oxygen saturation is 99%. .     General: Alert and oriented, in no acute distress HEENT: Head is normocephalic. Neurologic: No obvious focalities. Speech is fluent.  Psychiatric: Judgment and insight are intact. Affect is appropriate. Breast exam reveals satisfactory healing at right upper outer breast lumpectomy scar Ext: Good range of motion in her shoulders  Lab Findings: Lab Results  Component Value Date   WBC 6.3 12/15/2019   HGB 13.3 12/15/2019   HCT 39.0 12/15/2019   MCV 96.5 12/15/2019   PLT 251 12/15/2019      Radiographic Findings: No results found.  Impression/Plan: Right breast cancer  We discussed adjuvant radiotherapy today.  I recommend 4 weeks of adjuvant radiation to the right breast in order to reduce risk of local regional recurrence by two thirds.  I reviewed the logistics, benefits, risks, and potential side effects of this treatment in detail. Risks may include but not necessary be limited to acute and late injury tissue in the radiation fields such as skin irritation (change in color/pigmentation, itching, dryness, pain, peeling). She may experience fatigue. We also discussed possible risk of long term cosmetic changes or scar tissue. There is also a smaller risk for lung toxicity, lymphedema, musculoskeletal changes, rib fragility or induction of a second malignancy, late chronic non-healing  soft tissue wound.    The patient asked good questions which I answered to her satisfaction. She is enthusiastic about proceeding with treatment. A consent form has been signed and placed in her chart.  CT simulation will take place later this morning.  We will start her treatment next week.  We discussed measures to reduce the risk of infection during the COVID-19 pandemic.  She is due for her booster and I highly recommended it.  She is agreeable to receiving it today.  We will arrange that at the cancer center after her treatment planning session.   On date of service, in total, I spent 30 minutes on this encounter. Patient was seen in person.  _____________________________________   Eppie Gibson, MD  This document serves as a record of services personally performed by Eppie Gibson, MD. It was created on his behalf by Clerance Lav, a trained medical scribe. The creation of this record is based on the scribe's personal observations and the provider's statements to them. This document has been checked and approved by the attending provider.

## 2020-01-27 DIAGNOSIS — C50411 Malignant neoplasm of upper-outer quadrant of right female breast: Secondary | ICD-10-CM | POA: Diagnosis not present

## 2020-01-27 DIAGNOSIS — Z510 Encounter for antineoplastic radiation therapy: Secondary | ICD-10-CM | POA: Diagnosis not present

## 2020-01-27 DIAGNOSIS — Z170 Estrogen receptor positive status [ER+]: Secondary | ICD-10-CM | POA: Diagnosis not present

## 2020-01-28 ENCOUNTER — Encounter: Payer: Self-pay | Admitting: *Deleted

## 2020-01-28 ENCOUNTER — Ambulatory Visit
Admission: RE | Admit: 2020-01-28 | Discharge: 2020-01-28 | Disposition: A | Discharge status: Home / Self Care | Payer: Medicare HMO | Source: Ambulatory Visit | Attending: Radiation Oncology | Admitting: Radiation Oncology

## 2020-01-28 DIAGNOSIS — C50411 Malignant neoplasm of upper-outer quadrant of right female breast: Secondary | ICD-10-CM | POA: Diagnosis not present

## 2020-01-28 DIAGNOSIS — Z170 Estrogen receptor positive status [ER+]: Secondary | ICD-10-CM | POA: Diagnosis not present

## 2020-01-28 DIAGNOSIS — Z510 Encounter for antineoplastic radiation therapy: Secondary | ICD-10-CM | POA: Diagnosis not present

## 2020-01-29 ENCOUNTER — Ambulatory Visit
Admission: RE | Admit: 2020-01-29 | Discharge: 2020-01-29 | Disposition: A | Discharge status: Home / Self Care | Payer: Medicare HMO | Source: Ambulatory Visit | Attending: Radiation Oncology | Admitting: Radiation Oncology

## 2020-01-29 ENCOUNTER — Telehealth: Payer: Self-pay | Admitting: Hematology and Oncology

## 2020-01-29 DIAGNOSIS — Z510 Encounter for antineoplastic radiation therapy: Secondary | ICD-10-CM | POA: Diagnosis not present

## 2020-01-29 DIAGNOSIS — Z170 Estrogen receptor positive status [ER+]: Secondary | ICD-10-CM | POA: Diagnosis not present

## 2020-01-29 DIAGNOSIS — C50411 Malignant neoplasm of upper-outer quadrant of right female breast: Secondary | ICD-10-CM | POA: Diagnosis not present

## 2020-01-29 NOTE — Telephone Encounter (Signed)
Scheduled appt per 1/20 sch msg -  Mailed letter with appt date and time

## 2020-02-01 ENCOUNTER — Ambulatory Visit: Payer: Medicare HMO

## 2020-02-02 ENCOUNTER — Ambulatory Visit
Admission: RE | Admit: 2020-02-02 | Discharge: 2020-02-02 | Disposition: A | Discharge status: Home / Self Care | Payer: Medicare HMO | Source: Ambulatory Visit | Attending: Radiation Oncology | Admitting: Radiation Oncology

## 2020-02-02 DIAGNOSIS — C50411 Malignant neoplasm of upper-outer quadrant of right female breast: Secondary | ICD-10-CM | POA: Diagnosis not present

## 2020-02-02 DIAGNOSIS — Z510 Encounter for antineoplastic radiation therapy: Secondary | ICD-10-CM | POA: Diagnosis not present

## 2020-02-02 DIAGNOSIS — Z170 Estrogen receptor positive status [ER+]: Secondary | ICD-10-CM

## 2020-02-02 MED ORDER — ALRA NON-METALLIC DEODORANT (RAD-ONC)
1.0000 "application " | Freq: Once | TOPICAL | Status: AC
  Administered 2020-02-02: 1 via TOPICAL

## 2020-02-02 MED ORDER — RADIAPLEXRX EX GEL: Freq: Once | CUTANEOUS | Status: AC

## 2020-02-02 NOTE — Progress Notes (Signed)

## 2020-02-03 ENCOUNTER — Ambulatory Visit
Admission: RE | Admit: 2020-02-03 | Discharge: 2020-02-03 | Disposition: A | Discharge status: Home / Self Care | Payer: Medicare HMO | Source: Ambulatory Visit | Attending: Radiation Oncology | Admitting: Radiation Oncology

## 2020-02-03 ENCOUNTER — Other Ambulatory Visit: Payer: Self-pay

## 2020-02-03 DIAGNOSIS — Z510 Encounter for antineoplastic radiation therapy: Secondary | ICD-10-CM | POA: Diagnosis not present

## 2020-02-03 DIAGNOSIS — Z170 Estrogen receptor positive status [ER+]: Secondary | ICD-10-CM | POA: Diagnosis not present

## 2020-02-03 DIAGNOSIS — C50411 Malignant neoplasm of upper-outer quadrant of right female breast: Secondary | ICD-10-CM | POA: Diagnosis not present

## 2020-02-04 ENCOUNTER — Ambulatory Visit
Admission: RE | Admit: 2020-02-04 | Discharge: 2020-02-04 | Disposition: A | Discharge status: Home / Self Care | Payer: Medicare HMO | Source: Ambulatory Visit | Attending: Radiation Oncology | Admitting: Radiation Oncology

## 2020-02-04 ENCOUNTER — Other Ambulatory Visit: Payer: Self-pay

## 2020-02-04 DIAGNOSIS — Z170 Estrogen receptor positive status [ER+]: Secondary | ICD-10-CM | POA: Diagnosis not present

## 2020-02-04 DIAGNOSIS — C50411 Malignant neoplasm of upper-outer quadrant of right female breast: Secondary | ICD-10-CM | POA: Diagnosis not present

## 2020-02-04 DIAGNOSIS — Z510 Encounter for antineoplastic radiation therapy: Secondary | ICD-10-CM | POA: Diagnosis not present

## 2020-02-05 ENCOUNTER — Ambulatory Visit
Admission: RE | Admit: 2020-02-05 | Discharge: 2020-02-05 | Disposition: A | Discharge status: Home / Self Care | Payer: Medicare HMO | Source: Ambulatory Visit | Attending: Radiation Oncology | Admitting: Radiation Oncology

## 2020-02-05 DIAGNOSIS — Z170 Estrogen receptor positive status [ER+]: Secondary | ICD-10-CM | POA: Diagnosis not present

## 2020-02-05 DIAGNOSIS — Z510 Encounter for antineoplastic radiation therapy: Secondary | ICD-10-CM | POA: Diagnosis not present

## 2020-02-05 DIAGNOSIS — C50411 Malignant neoplasm of upper-outer quadrant of right female breast: Secondary | ICD-10-CM | POA: Diagnosis not present

## 2020-02-08 ENCOUNTER — Ambulatory Visit
Admission: RE | Admit: 2020-02-08 | Discharge: 2020-02-08 | Disposition: A | Discharge status: Home / Self Care | Payer: Medicare HMO | Source: Ambulatory Visit | Attending: Radiation Oncology | Admitting: Radiation Oncology

## 2020-02-08 DIAGNOSIS — Z510 Encounter for antineoplastic radiation therapy: Secondary | ICD-10-CM | POA: Diagnosis not present

## 2020-02-08 DIAGNOSIS — C50411 Malignant neoplasm of upper-outer quadrant of right female breast: Secondary | ICD-10-CM | POA: Diagnosis not present

## 2020-02-08 DIAGNOSIS — Z170 Estrogen receptor positive status [ER+]: Secondary | ICD-10-CM | POA: Diagnosis not present

## 2020-02-09 ENCOUNTER — Ambulatory Visit
Admission: RE | Admit: 2020-02-09 | Discharge: 2020-02-09 | Disposition: A | Discharge status: Home / Self Care | Payer: Medicare HMO | Source: Ambulatory Visit | Attending: Radiation Oncology | Admitting: Radiation Oncology

## 2020-02-09 DIAGNOSIS — Z170 Estrogen receptor positive status [ER+]: Secondary | ICD-10-CM | POA: Diagnosis not present

## 2020-02-09 DIAGNOSIS — C50411 Malignant neoplasm of upper-outer quadrant of right female breast: Secondary | ICD-10-CM | POA: Diagnosis not present

## 2020-02-10 ENCOUNTER — Other Ambulatory Visit: Payer: Self-pay | Admitting: Family Medicine

## 2020-02-10 ENCOUNTER — Encounter: Payer: Self-pay | Admitting: *Deleted

## 2020-02-10 ENCOUNTER — Ambulatory Visit
Admission: RE | Admit: 2020-02-10 | Discharge: 2020-02-10 | Disposition: A | Discharge status: Home / Self Care | Payer: Medicare HMO | Source: Ambulatory Visit | Attending: Radiation Oncology | Admitting: Radiation Oncology

## 2020-02-10 DIAGNOSIS — C50411 Malignant neoplasm of upper-outer quadrant of right female breast: Secondary | ICD-10-CM | POA: Diagnosis not present

## 2020-02-10 DIAGNOSIS — Z170 Estrogen receptor positive status [ER+]: Secondary | ICD-10-CM | POA: Diagnosis not present

## 2020-02-11 ENCOUNTER — Other Ambulatory Visit: Payer: Self-pay

## 2020-02-11 ENCOUNTER — Ambulatory Visit
Admission: RE | Admit: 2020-02-11 | Discharge: 2020-02-11 | Disposition: A | Discharge status: Home / Self Care | Payer: Medicare HMO | Source: Ambulatory Visit | Attending: Radiation Oncology | Admitting: Radiation Oncology

## 2020-02-11 DIAGNOSIS — Z170 Estrogen receptor positive status [ER+]: Secondary | ICD-10-CM | POA: Diagnosis not present

## 2020-02-11 DIAGNOSIS — C50411 Malignant neoplasm of upper-outer quadrant of right female breast: Secondary | ICD-10-CM | POA: Diagnosis not present

## 2020-02-12 ENCOUNTER — Other Ambulatory Visit: Payer: Self-pay

## 2020-02-12 ENCOUNTER — Ambulatory Visit
Admission: RE | Admit: 2020-02-12 | Discharge: 2020-02-12 | Disposition: A | Discharge status: Home / Self Care | Payer: Medicare HMO | Source: Ambulatory Visit | Attending: Radiation Oncology | Admitting: Radiation Oncology

## 2020-02-12 DIAGNOSIS — Z170 Estrogen receptor positive status [ER+]: Secondary | ICD-10-CM | POA: Diagnosis not present

## 2020-02-12 DIAGNOSIS — C50411 Malignant neoplasm of upper-outer quadrant of right female breast: Secondary | ICD-10-CM | POA: Diagnosis not present

## 2020-02-12 NOTE — Telephone Encounter (Signed)
Pt needs f/u appt for DM and htn.  In next 30-60 days. Thx. Dr. Lovena Le

## 2020-02-15 ENCOUNTER — Ambulatory Visit: Payer: Medicare HMO | Admitting: Radiation Oncology

## 2020-02-15 ENCOUNTER — Ambulatory Visit
Admission: RE | Admit: 2020-02-15 | Discharge: 2020-02-15 | Disposition: A | Discharge status: Home / Self Care | Payer: Medicare HMO | Source: Ambulatory Visit | Attending: Radiation Oncology | Admitting: Radiation Oncology

## 2020-02-15 DIAGNOSIS — Z170 Estrogen receptor positive status [ER+]: Secondary | ICD-10-CM | POA: Diagnosis not present

## 2020-02-15 DIAGNOSIS — C50411 Malignant neoplasm of upper-outer quadrant of right female breast: Secondary | ICD-10-CM

## 2020-02-15 MED ORDER — RADIAPLEXRX EX GEL: Freq: Once | CUTANEOUS | Status: AC

## 2020-02-15 NOTE — Telephone Encounter (Signed)
Please contact pt to have her set up. Thank you!

## 2020-02-16 ENCOUNTER — Ambulatory Visit
Admission: RE | Admit: 2020-02-16 | Discharge: 2020-02-16 | Disposition: A | Discharge status: Home / Self Care | Payer: Medicare HMO | Source: Ambulatory Visit | Attending: Radiation Oncology | Admitting: Radiation Oncology

## 2020-02-16 DIAGNOSIS — Z170 Estrogen receptor positive status [ER+]: Secondary | ICD-10-CM | POA: Diagnosis not present

## 2020-02-16 DIAGNOSIS — C50411 Malignant neoplasm of upper-outer quadrant of right female breast: Secondary | ICD-10-CM | POA: Diagnosis not present

## 2020-02-17 ENCOUNTER — Ambulatory Visit
Admission: RE | Admit: 2020-02-17 | Discharge: 2020-02-17 | Disposition: A | Discharge status: Home / Self Care | Payer: Medicare HMO | Source: Ambulatory Visit | Attending: Radiation Oncology | Admitting: Radiation Oncology

## 2020-02-17 ENCOUNTER — Other Ambulatory Visit: Payer: Self-pay

## 2020-02-17 DIAGNOSIS — C50411 Malignant neoplasm of upper-outer quadrant of right female breast: Secondary | ICD-10-CM | POA: Diagnosis not present

## 2020-02-17 DIAGNOSIS — Z170 Estrogen receptor positive status [ER+]: Secondary | ICD-10-CM | POA: Diagnosis not present

## 2020-02-18 ENCOUNTER — Ambulatory Visit
Admission: RE | Admit: 2020-02-18 | Discharge: 2020-02-18 | Disposition: A | Discharge status: Home / Self Care | Payer: Medicare HMO | Source: Ambulatory Visit | Attending: Radiation Oncology | Admitting: Radiation Oncology

## 2020-02-18 ENCOUNTER — Ambulatory Visit: Payer: Medicare HMO

## 2020-02-18 DIAGNOSIS — C50411 Malignant neoplasm of upper-outer quadrant of right female breast: Secondary | ICD-10-CM | POA: Diagnosis not present

## 2020-02-18 DIAGNOSIS — Z170 Estrogen receptor positive status [ER+]: Secondary | ICD-10-CM | POA: Diagnosis not present

## 2020-02-19 ENCOUNTER — Ambulatory Visit: Payer: Medicare HMO

## 2020-02-19 ENCOUNTER — Ambulatory Visit
Admission: RE | Admit: 2020-02-19 | Discharge: 2020-02-19 | Disposition: A | Discharge status: Home / Self Care | Payer: Medicare HMO | Source: Ambulatory Visit | Attending: Radiation Oncology | Admitting: Radiation Oncology

## 2020-02-19 DIAGNOSIS — C50411 Malignant neoplasm of upper-outer quadrant of right female breast: Secondary | ICD-10-CM | POA: Diagnosis not present

## 2020-02-19 DIAGNOSIS — Z170 Estrogen receptor positive status [ER+]: Secondary | ICD-10-CM | POA: Diagnosis not present

## 2020-02-19 NOTE — Telephone Encounter (Signed)
LM to schedule appointment

## 2020-02-21 ENCOUNTER — Ambulatory Visit: Payer: Medicare HMO

## 2020-02-21 NOTE — Progress Notes (Incomplete)
Patient Care Team: Erven Colla, DO as PCP - General (Family Medicine) Danie Binder, MD (Inactive) as Consulting Physician (Gastroenterology) Mauro Kaufmann, RN as Oncology Nurse Navigator Rockwell Germany, RN as Oncology Nurse Navigator  DIAGNOSIS: No diagnosis found.  SUMMARY OF ONCOLOGIC HISTORY: Oncology History  Malignant neoplasm of upper-outer quadrant of right breast in female, estrogen receptor positive ([place])  10/01/2019 Initial Diagnosis   Screening mammogram showed possible right breast masses. Diagnostic mammogram and US showed a 1.5cm mass at the 12 o'clock position, a 0.7cm mass in the outer right breast, a shadowing benign calcification at the 1 o'clock position, and a cluster of cysts at the 9 o'clock position, with no right axillary adenopathy. Right breast biopsy of the 12:00 mass showed IDC, grade 1, HER-2 negative (1+), ER+ 90%, PR+ 95%, Ki67 15%. Biopsy of the outer right breast mass showed fibroadenoma and ALH.   10/21/2019 Cancer Staging   Staging form: Breast, AJCC 8th Edition - Clinical stage from 10/21/2019: Stage IA (cT1c, cN0, cM0, G1, ER+, PR+, HER2-) - Signed by Nicholas Lose, MD on 10/21/2019   11/24/2019 Surgery   Right lumpectomy (Tsuei): IDC, 0.9cm, grade 2, involved superior margin, 5 right axillary lymph nodes negative for carcinoma.   01/29/2020 -  Radiation Therapy   Adjuvant radiation     CHIEF COMPLIANT: Follow-up to discuss antiestrogen therapy  INTERVAL HISTORY: Shannon Price is a 60 y.o. with above-mentioned history of right breast cancer who underwent a right lumpectomy, and is currently on radiation therapy. She presents to the clinic today to discuss antiestrogen therapy.   ALLERGIES:  has No Known Allergies.  MEDICATIONS:  Current Outpatient Medications  Medication Sig Dispense Refill  . acetaminophen (TYLENOL) 500 MG tablet Take 1,000 mg by mouth every 6 (six) hours as needed for mild pain or moderate pain.     Marland Kitchen aspirin  325 MG tablet Take 1 tablet (325 mg total) by mouth daily. 30 tablet 5  . atorvastatin (LIPITOR) 20 MG tablet TAKE 1 TABLET EVERY DAY (Patient taking differently: Take 20 mg by mouth daily.) 90 tablet 0  . clonazePAM (KLONOPIN) 0.5 MG tablet Take one qhs prn sleep (Patient taking differently: Take 0.5 mg by mouth at bedtime as needed (sleep).) 30 tablet 5  . escitalopram (LEXAPRO) 20 MG tablet TAKE 1 TABLET EVERY DAY (Patient taking differently: Take 20 mg by mouth daily.) 90 tablet 0  . guaifenesin (ROBITUSSIN) 100 MG/5ML syrup Take 200 mg by mouth 4 (four) times daily as needed for cough (cough/allergies/cold symptoms).     Marland Kitchen lisinopril-hydrochlorothiazide (ZESTORETIC) 20-12.5 MG tablet TAKE 1 TABLET EVERY DAY 90 tablet 0  . metFORMIN (GLUCOPHAGE) 1000 MG tablet Take 1 tablet (1,000 mg total) by mouth 2 (two) times daily. 180 tablet 1  . ondansetron (ZOFRAN ODT) 4 MG disintegrating tablet Take 1 tablet (4 mg total) by mouth every 8 (eight) hours as needed for nausea or vomiting. 30 tablet 0  . oxyCODONE (OXY IR/ROXICODONE) 5 MG immediate release tablet Take 1 tablet (5 mg total) by mouth every 6 (six) hours as needed for severe pain. (Patient not taking: No sig reported) 15 tablet 0  . Phenyleph-CPM-DM-APAP 05-09-08-325 MG CAPS Take 1-2 capsules by mouth daily as needed (allergies/cold symptoms).     Marland Kitchen tetrahydrozoline 0.05 % ophthalmic solution Place 1-2 drops into both eyes 3 (three) times daily as needed (irritated/dry eyes.).    Marland Kitchen verapamil (CALAN-SR) 240 MG CR tablet TAKE 1 TABLET EVERY MORNING (Patient taking  differently: Take 240 mg by mouth daily.) 90 tablet 0   No current facility-administered medications for this visit.    PHYSICAL EXAMINATION: ECOG PERFORMANCE STATUS: {CHL ONC ECOG [phone]}  There were no vitals filed for this visit. There were no vitals filed for this visit.  BREAST:*** No palpable masses or nodules in either right or left breasts. No palpable axillary  supraclavicular or infraclavicular adenopathy no breast tenderness or nipple discharge. (exam performed in the presence of a chaperone)  LABORATORY DATA:  I have reviewed the data as listed CMP Latest Ref Rng & Units 12/15/2019 10/27/2019 08/24/2019  Glucose 70 - 99 mg/dL 180(H) 145(H) -  BUN 6 - 20 mg/dL 8 7 -  Creatinine 0.44 - 1.00 mg/dL 0.84 0.79 0.80  Sodium 135 - 145 mmol/L 132(L) 134(L) -  Potassium 3.5 - 5.1 mmol/L 4.9 4.5 -  Chloride 98 - 111 mmol/L 97(L) 98 -  CO2 22 - 32 mmol/L 24 26 -  Calcium 8.9 - 10.3 mg/dL 9.4 9.0 -  Total Protein 6.0 - 8.5 g/dL - - -  Total Bilirubin 0.0 - 1.2 mg/dL - - -  Alkaline Phos 48 - 121 IU/L - - -  AST 0 - 40 IU/L - - -  ALT 0 - 32 IU/L - - -    Lab Results  Component Value Date   WBC 6.3 12/15/2019   HGB 13.3 12/15/2019   HCT 39.0 12/15/2019   MCV 96.5 12/15/2019   PLT 251 12/15/2019   NEUTROABS 8.9 (H) 03/09/2018    ASSESSMENT & PLAN:  No problem-specific Assessment & Plan notes found for this encounter.    No orders of the defined types were placed in this encounter.  The patient has a good understanding of the overall plan. she agrees with it. she will call with any problems that may develop before the next visit here.  Total time spent: *** mins including face to face time and time spent for planning, charting and coordination of care  Rulon Eisenmenger, MD, MPH 02/21/2020  I, Molly Dorshimer, am acting as scribe for Dr. Nicholas Lose.  {insert scribe attestation}

## 2020-02-22 ENCOUNTER — Other Ambulatory Visit: Payer: Self-pay | Admitting: Family Medicine

## 2020-02-22 ENCOUNTER — Inpatient Hospital Stay: Payer: Medicare HMO | Attending: Hematology and Oncology | Admitting: Hematology and Oncology

## 2020-02-22 ENCOUNTER — Ambulatory Visit: Payer: Medicare HMO

## 2020-02-22 ENCOUNTER — Ambulatory Visit
Admission: RE | Admit: 2020-02-22 | Discharge: 2020-02-22 | Disposition: A | Discharge status: Home / Self Care | Payer: Medicare HMO | Source: Ambulatory Visit | Attending: Radiation Oncology | Admitting: Radiation Oncology

## 2020-02-22 ENCOUNTER — Other Ambulatory Visit: Payer: Self-pay

## 2020-02-22 DIAGNOSIS — Z170 Estrogen receptor positive status [ER+]: Secondary | ICD-10-CM | POA: Diagnosis not present

## 2020-02-22 DIAGNOSIS — F321 Major depressive disorder, single episode, moderate: Secondary | ICD-10-CM

## 2020-02-22 DIAGNOSIS — C50411 Malignant neoplasm of upper-outer quadrant of right female breast: Secondary | ICD-10-CM | POA: Diagnosis not present

## 2020-02-22 DIAGNOSIS — I63312 Cerebral infarction due to thrombosis of left middle cerebral artery: Secondary | ICD-10-CM

## 2020-02-22 NOTE — Assessment & Plan Note (Deleted)
11/24/19: Right lumpectomy (Tsuei): IDC, 0.9cm, grade 2, involved superior margin, 5 right axillary lymph nodes negative for carcinoma. HER-2 negative (1+), ER+ 90%, PR+ 95%, Ki67 15%. 12/15/19: Margin reexcision: Negative for cancer Oncotype score 14 (ROR 4%) Adj XRT 01/29/20-  Plan: Anti estrogen therapy with Anastrozole 1 mg daily X 5-7 years.  Anastrozole counseling: We discussed the risks and benefits of anti-estrogen therapy with aromatase inhibitors. These include but not limited to insomnia, hot flashes, mood changes, vaginal dryness, bone density loss, and weight gain. We strongly believe that the benefits far outweigh the risks. Patient understands these risks and consented to starting treatment. Planned treatment duration is 5-7 years.  RTC in 3 months for SCP visit

## 2020-02-23 ENCOUNTER — Ambulatory Visit
Admission: RE | Admit: 2020-02-23 | Discharge: 2020-02-23 | Disposition: A | Discharge status: Home / Self Care | Payer: Medicare HMO | Source: Ambulatory Visit | Attending: Radiation Oncology | Admitting: Radiation Oncology

## 2020-02-23 ENCOUNTER — Other Ambulatory Visit: Payer: Self-pay

## 2020-02-23 DIAGNOSIS — C50411 Malignant neoplasm of upper-outer quadrant of right female breast: Secondary | ICD-10-CM | POA: Diagnosis not present

## 2020-02-23 DIAGNOSIS — Z170 Estrogen receptor positive status [ER+]: Secondary | ICD-10-CM | POA: Diagnosis not present

## 2020-02-24 ENCOUNTER — Other Ambulatory Visit: Payer: Self-pay

## 2020-02-24 ENCOUNTER — Ambulatory Visit: Payer: Medicare HMO

## 2020-02-24 ENCOUNTER — Ambulatory Visit
Admission: RE | Admit: 2020-02-24 | Discharge: 2020-02-24 | Disposition: A | Discharge status: Home / Self Care | Payer: Medicare HMO | Source: Ambulatory Visit | Attending: Radiation Oncology | Admitting: Radiation Oncology

## 2020-02-24 DIAGNOSIS — C50411 Malignant neoplasm of upper-outer quadrant of right female breast: Secondary | ICD-10-CM | POA: Diagnosis not present

## 2020-02-24 DIAGNOSIS — Z170 Estrogen receptor positive status [ER+]: Secondary | ICD-10-CM | POA: Diagnosis not present

## 2020-02-25 ENCOUNTER — Other Ambulatory Visit: Payer: Self-pay

## 2020-02-25 ENCOUNTER — Encounter: Payer: Self-pay | Admitting: *Deleted

## 2020-02-25 ENCOUNTER — Encounter: Payer: Self-pay | Admitting: Radiation Oncology

## 2020-02-25 ENCOUNTER — Ambulatory Visit: Payer: Medicare HMO

## 2020-02-25 ENCOUNTER — Ambulatory Visit
Admission: RE | Admit: 2020-02-25 | Discharge: 2020-02-25 | Disposition: A | Discharge status: Home / Self Care | Payer: Medicare HMO | Source: Ambulatory Visit | Attending: Radiation Oncology | Admitting: Radiation Oncology

## 2020-02-25 DIAGNOSIS — Z170 Estrogen receptor positive status [ER+]: Secondary | ICD-10-CM | POA: Diagnosis not present

## 2020-02-25 DIAGNOSIS — C50411 Malignant neoplasm of upper-outer quadrant of right female breast: Secondary | ICD-10-CM | POA: Diagnosis not present

## 2020-02-26 ENCOUNTER — Ambulatory Visit: Payer: Medicare HMO

## 2020-03-02 ENCOUNTER — Other Ambulatory Visit: Payer: Self-pay

## 2020-03-02 ENCOUNTER — Telehealth (INDEPENDENT_AMBULATORY_CARE_PROVIDER_SITE_OTHER): Payer: Medicare HMO | Admitting: Family Medicine

## 2020-03-02 DIAGNOSIS — Z170 Estrogen receptor positive status [ER+]: Secondary | ICD-10-CM

## 2020-03-02 DIAGNOSIS — F419 Anxiety disorder, unspecified: Secondary | ICD-10-CM | POA: Diagnosis not present

## 2020-03-02 DIAGNOSIS — F321 Major depressive disorder, single episode, moderate: Secondary | ICD-10-CM | POA: Diagnosis not present

## 2020-03-02 DIAGNOSIS — T300 Burn of unspecified body region, unspecified degree: Secondary | ICD-10-CM

## 2020-03-02 DIAGNOSIS — E119 Type 2 diabetes mellitus without complications: Secondary | ICD-10-CM | POA: Diagnosis not present

## 2020-03-02 DIAGNOSIS — T66XXXA Radiation sickness, unspecified, initial encounter: Secondary | ICD-10-CM

## 2020-03-02 MED ORDER — OXYCODONE-ACETAMINOPHEN 5-325 MG PO TABS: 1.0000 | ORAL_TABLET | Freq: Four times a day (QID) | ORAL | 0 refills | Status: DC | PRN

## 2020-03-02 NOTE — Progress Notes (Signed)
Patient ID: Shannon Price, female    DOB: 1960-01-19, 60 y.o.   MRN: [id number]   Virtual Visit via Telephone Note  I connected with Shannon Price on 03/02/20 at  2:10 PM EST by telephone and verified that I am speaking with the correct person using two identifiers.  Location: Patient: home Provider: office   I discussed the limitations, risks, security and privacy concerns of performing an evaluation and management service by telephone and the availability of in person appointments. I also discussed with the patient that there may be a patient responsible charge related to this service. The patient expressed understanding and agreed to proceed.  Chief Complaint  Patient presents with  . Diabetes    Follow up   Subjective:    HPI Phone visit to f/u diabetes.  Pt having radiation treatments last week,  Had to do 2 surgeries for breast cancer.  Hurting under arm badly.  Took out the lymph nodes.  Not seeing them anymore.  finished radiation tx.  Radiation doctor- appt next month.  Went to [employer] for weekend.  nto sure if worsened.  Not sure if trying to open up.  Putting in neosporin.  Husband had some hydrocodone and tried and didn't help.  Moving the arm, is hurting.  Radiation burn, she isn't sure.   Radiation cream to put on it. (radioplex) and cortisone cream.  Feeling "useless" and in pain and tired.  Hard to do chores.  She feels it is situational anxiety/depression. Used to help the lexapro 20mg .  Used to have outburst and helps. Still has some clonazepam 0.5mg  prn still has an old script.  Always had sleep issues in past.   Medical History Shannon Price has a past medical history of Anxiety, Cancer ([place]) (09/2019), COPD (chronic obstructive pulmonary disease) ([place]), CVA (cerebral vascular accident) ([place]) (07/2013), Depression, High cholesterol, Hypertension, Smoker, and Type II diabetes mellitus ([place]).   Outpatient Encounter Medications as of 03/02/2020  Medication  Sig  . oxyCODONE-acetaminophen (PERCOCET) 5-325 MG tablet Take 1 tablet by mouth every 6 (six) hours as needed for severe pain.  Marland Kitchen acetaminophen (TYLENOL) 500 MG tablet Take 1,000 mg by mouth every 6 (six) hours as needed for mild pain or moderate pain.   Marland Kitchen aspirin 325 MG tablet Take 1 tablet (325 mg total) by mouth daily.  Marland Kitchen atorvastatin (LIPITOR) 20 MG tablet TAKE 1 TABLET EVERY DAY  . clonazePAM (KLONOPIN) 0.5 MG tablet Take one qhs prn sleep (Patient taking differently: Take 0.5 mg by mouth at bedtime as needed (sleep).)  . escitalopram (LEXAPRO) 20 MG tablet Take 1 tablet (20 mg total) by mouth daily.  Marland Kitchen guaifenesin (ROBITUSSIN) 100 MG/5ML syrup Take 200 mg by mouth 4 (four) times daily as needed for cough (cough/allergies/cold symptoms).   Marland Kitchen lisinopril-hydrochlorothiazide (ZESTORETIC) 20-12.5 MG tablet TAKE 1 TABLET EVERY DAY  . metFORMIN (GLUCOPHAGE) 1000 MG tablet Take 1 tablet (1,000 mg total) by mouth 2 (two) times daily.  . ondansetron (ZOFRAN ODT) 4 MG disintegrating tablet Take 1 tablet (4 mg total) by mouth every 8 (eight) hours as needed for nausea or vomiting.  Marland Kitchen Phenyleph-CPM-DM-APAP 05-09-08-325 MG CAPS Take 1-2 capsules by mouth daily as needed (allergies/cold symptoms).   Marland Kitchen tetrahydrozoline 0.05 % ophthalmic solution Place 1-2 drops into both eyes 3 (three) times daily as needed (irritated/dry eyes.).  Marland Kitchen verapamil (CALAN-SR) 240 MG CR tablet TAKE 1 TABLET EVERY MORNING  . [DISCONTINUED] oxyCODONE (OXY IR/ROXICODONE) 5 MG immediate release tablet Take 1 tablet (  5 mg total) by mouth every 6 (six) hours as needed for severe pain. (Patient not taking: No sig reported)   No facility-administered encounter medications on file as of 03/02/2020.     Review of Systems  Constitutional: Negative for chills and fever.  HENT: Negative for congestion, rhinorrhea and sore throat.   Respiratory: Negative for cough, shortness of breath and wheezing.   Cardiovascular: Negative for chest pain  and leg swelling.  Gastrointestinal: Negative for abdominal pain, diarrhea, nausea and vomiting.  Genitourinary: Negative for dysuria and frequency.  Musculoskeletal: Negative for arthralgias and back pain.  Skin: Positive for rash (under axilla from radiation).  Neurological: Negative for dizziness, weakness and headaches.  Psychiatric/Behavioral: Positive for agitation and dysphoric mood. Negative for self-injury, sleep disturbance and suicidal ideas. The patient is nervous/anxious.      Vitals There were no vitals taken for this visit.  Objective:   Physical Exam  +erythema and burn from radiation under rt axilla down to rt lateral breast. NAD, no resp distress.  Assessment and Plan   1. Radiation burn  2. Malignant neoplasm of upper-outer quadrant of right breast in female, estrogen receptor positive ([place])  3. Radiation therapy complication, initial encounter - oxyCODONE-acetaminophen (PERCOCET) 5-325 MG tablet; Take 1 tablet by mouth every 6 (six) hours as needed for severe pain.  Dispense: 30 tablet; Refill: 0  4. Depression, major, single episode, moderate ([place])  5. Anxiety    PT having a lot of pain from the radiation on rt axilla and rt breast.  Will give pain medication prn. Pt to f/u with rad oncologist for further pain medications.  Depression/anxiety- cont with lexapro and clonazepam.  Reschedule pt for f/u on diabetes in 45mo and have pt get labs 1 wk prior   Follow Up Instructions:    I discussed the assessment and treatment plan with the patient. The patient was provided an opportunity to ask questions and all were answered. The patient agreed with the plan and demonstrated an understanding of the instructions.   The patient was advised to call back or seek an in-person evaluation if the symptoms worsen or if the condition fails to improve as anticipated.  I provided 15 minutes of non-face-to-face time during this encounter.

## 2020-03-24 ENCOUNTER — Telehealth: Payer: Self-pay

## 2020-03-24 NOTE — Telephone Encounter (Signed)
I called the patient today about her upcoming follow-up appointment in radiation oncology.   Given the state of the COVID-19 pandemic, concerning case numbers in our community, and guidance from [facility], I offered a phone assessment with the patient to determine if coming to the clinic was necessary. She accepted.  I let the patient know that I had spoken with Dr. Isidore Moos, and she wanted them to know the importance of washing their hands for at least 20 seconds at a time, especially after going out in public, and before they eat.  Limit going out in public whenever possible. Do not touch your face, unless your hands are clean, such as when bathing. Get plenty of rest, eat well, and stay hydrated. Patient verbalized understanding and agreement.  The patient denies any symptomatic concerns. She is still dealing with intermittent fatigue, but states it has greatly improved since finishing radiation. Her breast pain has resolved and she denies any residual swelling to the breast or axilla. She reports occasional range of motion limitation to her right shoulder/arm, but states they are mild and manageable. Specifically, she reports good healing of her skin in the radiation fields. She did have significant peeling/moist desquamation to her axilla the first week after completing radiation, but she states this has resolved. She was diligent about applying Neosporin and nonadherent bandages to the area, and states the skin is intact and slowly returning to her baseline coloring. I recommended that she continue skin care by applying oil or lotion with vitamin E to the skin in the radiation fields, BID, for 2 more months.    Continue follow-up with medical oncology - follow-up still needs to be scheduled. Informed patient I would reach out to Dr. Geralyn Flash team, and that she should expect a call soon to schedule her next follow-up with him.  I explained that yearly mammograms are important for patients  with intact breast tissue, and physical exams are important after mastectomy for patients that cannot undergo mammography.  I encouraged her to call if she had further questions or concerns about her healing. Otherwise, she will follow-up PRN in radiation oncology. Patient is pleased with this plan, and we will cancel her upcoming follow-up to reduce the risk of COVID-19 transmission.

## 2020-03-25 ENCOUNTER — Telehealth: Payer: Self-pay | Admitting: Hematology and Oncology

## 2020-03-25 ENCOUNTER — Ambulatory Visit: Payer: Medicare HMO | Admitting: Radiation Oncology

## 2020-03-25 NOTE — Telephone Encounter (Signed)
Scheduled appt per 3/17 sch msg. Called pt, no answer and no vm available. Mailed updated calendar to patient.

## 2020-03-30 NOTE — Progress Notes (Incomplete)
Patient Care Team: Erven Colla, DO as PCP - General (Family Medicine) Danie Binder, MD (Inactive) as Consulting Physician (Gastroenterology) Mauro Kaufmann, RN as Oncology Nurse Navigator Rockwell Germany, RN as Oncology Nurse Navigator  DIAGNOSIS: No diagnosis found.  SUMMARY OF ONCOLOGIC HISTORY: Oncology History  Malignant neoplasm of upper-outer quadrant of right breast in female, estrogen receptor positive ([place])  10/01/2019 Initial Diagnosis   Screening mammogram showed possible right breast masses. Diagnostic mammogram and US showed a 1.5cm mass at the 12 o'clock position, a 0.7cm mass in the outer right breast, a shadowing benign calcification at the 1 o'clock position, and a cluster of cysts at the 9 o'clock position, with no right axillary adenopathy. Right breast biopsy of the 12:00 mass showed IDC, grade 1, HER-2 negative (1+), ER+ 90%, PR+ 95%, Ki67 15%. Biopsy of the outer right breast mass showed fibroadenoma and ALH.   10/21/2019 Cancer Staging   Staging form: Breast, AJCC 8th Edition - Clinical stage from 10/21/2019: Stage IA (cT1c, cN0, cM0, G1, ER+, PR+, HER2-) - Signed by Nicholas Lose, MD on 10/21/2019   11/24/2019 Surgery   Right lumpectomy (Tsuei): IDC, 0.9cm, grade 2, involved superior margin, 5 right axillary lymph nodes negative for carcinoma.   01/11/2020 Oncotype testing   Score: 14 (4% ROR)   01/29/2020 -  Radiation Therapy   Adjuvant radiation     CHIEF COMPLIANT: Follow-up to discuss antiestrogen therapy  INTERVAL HISTORY: Shannon Price is a 60 y.o. with above-mentioned history of right breast cancer who underwent a right lumpectomy followed by re-excision, radiation, and presents to the clinic today to discuss antiestrogen therapy.   ALLERGIES:  has No Known Allergies.  MEDICATIONS:  Current Outpatient Medications  Medication Sig Dispense Refill  . acetaminophen (TYLENOL) 500 MG tablet Take 1,000 mg by mouth every 6 (six) hours as needed  for mild pain or moderate pain.     Marland Kitchen aspirin 325 MG tablet Take 1 tablet (325 mg total) by mouth daily. 30 tablet 5  . atorvastatin (LIPITOR) 20 MG tablet TAKE 1 TABLET EVERY DAY 90 tablet 0  . clonazePAM (KLONOPIN) 0.5 MG tablet Take one qhs prn sleep (Patient taking differently: Take 0.5 mg by mouth at bedtime as needed (sleep).) 30 tablet 5  . escitalopram (LEXAPRO) 20 MG tablet Take 1 tablet (20 mg total) by mouth daily. 90 tablet 0  . guaifenesin (ROBITUSSIN) 100 MG/5ML syrup Take 200 mg by mouth 4 (four) times daily as needed for cough (cough/allergies/cold symptoms).     Marland Kitchen lisinopril-hydrochlorothiazide (ZESTORETIC) 20-12.5 MG tablet TAKE 1 TABLET EVERY DAY 90 tablet 0  . metFORMIN (GLUCOPHAGE) 1000 MG tablet Take 1 tablet (1,000 mg total) by mouth 2 (two) times daily. 180 tablet 1  . ondansetron (ZOFRAN ODT) 4 MG disintegrating tablet Take 1 tablet (4 mg total) by mouth every 8 (eight) hours as needed for nausea or vomiting. 30 tablet 0  . oxyCODONE-acetaminophen (PERCOCET) 5-325 MG tablet Take 1 tablet by mouth every 6 (six) hours as needed for severe pain. 30 tablet 0  . Phenyleph-CPM-DM-APAP 05-09-08-325 MG CAPS Take 1-2 capsules by mouth daily as needed (allergies/cold symptoms).     Marland Kitchen tetrahydrozoline 0.05 % ophthalmic solution Place 1-2 drops into both eyes 3 (three) times daily as needed (irritated/dry eyes.).    Marland Kitchen verapamil (CALAN-SR) 240 MG CR tablet TAKE 1 TABLET EVERY MORNING 90 tablet 0   No current facility-administered medications for this visit.    PHYSICAL EXAMINATION: ECOG PERFORMANCE  STATUS: {CHL ONC ECOG [phone]}  There were no vitals filed for this visit. There were no vitals filed for this visit.  LABORATORY DATA:  I have reviewed the data as listed CMP Latest Ref Rng & Units 12/15/2019 10/27/2019 08/24/2019  Glucose 70 - 99 mg/dL 180(H) 145(H) -  BUN 6 - 20 mg/dL 8 7 -  Creatinine 0.44 - 1.00 mg/dL 0.84 0.79 0.80  Sodium 135 - 145 mmol/L 132(L) 134(L) -   Potassium 3.5 - 5.1 mmol/L 4.9 4.5 -  Chloride 98 - 111 mmol/L 97(L) 98 -  CO2 22 - 32 mmol/L 24 26 -  Calcium 8.9 - 10.3 mg/dL 9.4 9.0 -  Total Protein 6.0 - 8.5 g/dL - - -  Total Bilirubin 0.0 - 1.2 mg/dL - - -  Alkaline Phos 48 - 121 IU/L - - -  AST 0 - 40 IU/L - - -  ALT 0 - 32 IU/L - - -    Lab Results  Component Value Date   WBC 6.3 12/15/2019   HGB 13.3 12/15/2019   HCT 39.0 12/15/2019   MCV 96.5 12/15/2019   PLT 251 12/15/2019   NEUTROABS 8.9 (H) 03/09/2018    ASSESSMENT & PLAN:  No problem-specific Assessment & Plan notes found for this encounter.    No orders of the defined types were placed in this encounter.  The patient has a good understanding of the overall plan. she agrees with it. she will call with any problems that may develop before the next visit here.  Total time spent: *** mins including face to face time and time spent for planning, charting and coordination of care  Rulon Eisenmenger, MD, MPH 03/30/2020  I, Molly Dorshimer, am acting as scribe for Dr. Nicholas Lose.  {insert scribe attestation}

## 2020-03-30 NOTE — Assessment & Plan Note (Deleted)
11/24/19: Right lumpectomy (Tsuei): IDC, 0.9cm, grade 2, involved superior margin, 5 right axillary lymph nodes negative for carcinoma. HER-2 negative (1+), ER+ 90%, PR+ 95%, Ki67 15%.  Treatment Plan: 1. Resection of positive margin scheduled for December 15, 2019. 2. Oncotype DX testing: 14 (ROR 4%) 3. Adjuvant radiation therapy Completed 02/22/20 4. Adjuvant antiestrogen therapy ------------------------------------------------------------------------------------------------------------- Anastrozole counseling: We discussed the risks and benefits of anti-estrogen therapy with aromatase inhibitors. These include but not limited to insomnia, hot flashes, mood changes, vaginal dryness, bone density loss, and weight gain. We strongly believe that the benefits far outweigh the risks. Patient understands these risks and consented to starting treatment. Planned treatment duration is 5-7 years.  RTC in 3 months for SCP visit

## 2020-03-31 ENCOUNTER — Inpatient Hospital Stay: Payer: Medicare HMO | Attending: Hematology and Oncology | Admitting: Hematology and Oncology

## 2020-03-31 DIAGNOSIS — C50411 Malignant neoplasm of upper-outer quadrant of right female breast: Secondary | ICD-10-CM

## 2020-04-27 NOTE — Progress Notes (Signed)
  Patient Name: Shannon Price MRN: [id number] DOB: 04/18/1960 Referring Physician: Nicholas Lose (Profile Not Attached) Date of Service: 02/25/2020 [facility], [place]                                                        End Of Treatment Note  Diagnoses: C50.411-Malignant neoplasm of upper-outer quadrant of right female breast  Cancer Staging: Cancer Staging Malignant neoplasm of upper-outer quadrant of right breast in female, estrogen receptor positive ([place]) Staging form: Breast, AJCC 8th Edition - Clinical stage from 10/21/2019: Stage IA (cT1c, cN0, cM0, G1, ER+, PR+, HER2-) - Signed by Nicholas Lose, MD on 10/21/2019 Stage prefix: Initial diagnosis Histologic grading system: 3 grade system   Intent: Curative  Radiation Treatment Dates: 01/28/2020 through 02/25/2020 Site Technique Total Dose (Gy) Dose per Fx (Gy) Completed Fx Beam Energies  Breast, Right: Breast_Rt 3D 40.05/40.05 2.67 15/15 10X, 15X  Breast, Right: Breast_Rt_Bst 3D 10/10 2 5/5 6X, 10X, 15X   Narrative: The patient tolerated radiation therapy relatively well.   Plan: The patient will follow-up with radiation oncology in 32mo or prn . -----------------------------------  Eppie Gibson, MD

## 2020-06-08 ENCOUNTER — Other Ambulatory Visit: Payer: Self-pay | Admitting: Family Medicine

## 2020-06-08 DIAGNOSIS — I63312 Cerebral infarction due to thrombosis of left middle cerebral artery: Secondary | ICD-10-CM

## 2020-06-08 DIAGNOSIS — F321 Major depressive disorder, single episode, moderate: Secondary | ICD-10-CM

## 2020-06-09 NOTE — Telephone Encounter (Signed)
Please contact patient to have her schedule follow up at the end of June. Thank you!

## 2020-06-09 NOTE — Telephone Encounter (Signed)
Let message to schedule appointment

## 2020-06-09 NOTE — Telephone Encounter (Signed)
Needs f/u app end of June for anxiety and hld.   Dr. Lovena Le

## 2020-06-09 NOTE — Telephone Encounter (Signed)
Lab Results  Component Value Date   HGBA1C 6.2 (H) 07/21/2019    Lab Results  Component Value Date   CREATININE 0.84 12/15/2019     Lab Results  Component Value Date   CHOL 198 07/21/2019   HDL 47 07/21/2019   LDLCALC 112 (H) 07/21/2019   TRIG 228 (H) 07/21/2019   CHOLHDL 4.2 07/21/2019     BP Readings from Last 3 Encounters:  01/22/20 (!) 158/89  12/15/19 (!) 145/78  12/01/19 (!) 147/67

## 2020-07-28 ENCOUNTER — Encounter: Payer: Self-pay | Admitting: *Deleted

## 2020-08-01 ENCOUNTER — Encounter: Payer: Self-pay | Admitting: Family Medicine

## 2020-08-01 ENCOUNTER — Other Ambulatory Visit: Payer: Self-pay

## 2020-08-01 ENCOUNTER — Ambulatory Visit (INDEPENDENT_AMBULATORY_CARE_PROVIDER_SITE_OTHER): Payer: Medicare HMO | Admitting: Family Medicine

## 2020-08-01 VITALS — BP 158/111 | HR 112 | Temp 97.7°F | Ht 65.0 in | Wt 260.0 lb

## 2020-08-01 DIAGNOSIS — R233 Spontaneous ecchymoses: Secondary | ICD-10-CM

## 2020-08-01 DIAGNOSIS — E785 Hyperlipidemia, unspecified: Secondary | ICD-10-CM

## 2020-08-01 DIAGNOSIS — R5383 Other fatigue: Secondary | ICD-10-CM

## 2020-08-01 DIAGNOSIS — R238 Other skin changes: Secondary | ICD-10-CM

## 2020-08-01 DIAGNOSIS — E119 Type 2 diabetes mellitus without complications: Secondary | ICD-10-CM

## 2020-08-01 DIAGNOSIS — I10 Essential (primary) hypertension: Secondary | ICD-10-CM | POA: Diagnosis not present

## 2020-08-01 MED ORDER — METFORMIN HCL 1000 MG PO TABS
1000.0000 mg | ORAL_TABLET | Freq: Two times a day (BID) | ORAL | 0 refills | Status: DC
Start: 2020-08-01 — End: 2020-09-26

## 2020-08-01 MED ORDER — LISINOPRIL-HYDROCHLOROTHIAZIDE 20-12.5 MG PO TABS
1.0000 | ORAL_TABLET | Freq: Every day | ORAL | 0 refills | Status: DC
Start: 2020-08-01 — End: 2020-11-30

## 2020-08-01 NOTE — Progress Notes (Signed)
Patient ID: Shannon Price, female    DOB: September 24, 1960, 60 y.o.   MRN: [id number]   Chief Complaint  Patient presents with   Bleeding/Bruising    4 to 5 months  Fatigue, falls    Subjective:    HPI H/o rt breast cancer, went through surgery and radiation. Dx 9/21.  Had area under rt xailla is finally healing. Hard to get up, fatigued.  Feeling sleeping 11-12pm. If working a little the back starts hurting. Weight hasn't changed.   Radiation- last tx was 12/21 for rt breast cancer.  Taking lexapro 43m daily when waking up around noon. Was waking up 6am normally.   Snoring.  Not noticing any apnea. No appetite at times. Only eating 1 meal per day.   Pt didn't get her bp meds from pharmacy yet.  Getting bruising on fingers and armas,  Left index and midle finger.  Taking 3217masa due to stroke in past. Not painful and are deep, red, purple.   Medical History JuLaynias a past medical history of Anxiety, Cancer ([facility]), COPD (chronic obstructive pulmonary disease) ([facility] CVA (cerebral vascular accident) ([facility](07/2013), Depression, High cholesterol, Hypertension, Smoker, and Type II diabetes mellitus ([facility]   Outpatient Encounter Medications as of 08/01/2020  Medication Sig   acetaminophen (TYLENOL) 500 MG tablet Take 1,000 mg by mouth every 6 (six) hours as needed for mild pain or moderate pain.    aspirin 325 MG tablet Take 1 tablet (325 mg total) by mouth daily.   atorvastatin (LIPITOR) 20 MG tablet TAKE 1 TABLET EVERY DAY   clonazePAM (KLONOPIN) 0.5 MG tablet Take one qhs prn sleep (Patient taking differently: Take 0.5 mg by mouth at bedtime as needed (sleep).)   escitalopram (LEXAPRO) 20 MG tablet TAKE 1 TABLET EVERY DAY   guaifenesin (ROBITUSSIN) 100 MG/5ML syrup Take 200 mg by mouth 4 (four) times daily as needed for cough (cough/allergies/cold symptoms).    lisinopril-hydrochlorothiazide (ZESTORETIC) 20-12.5 MG tablet TAKE 1 TABLET EVERY DAY   metFORMIN  (GLUCOPHAGE) 1000 MG tablet Take 1 tablet (1,000 mg total) by mouth 2 (two) times daily.   verapamil (CALAN-SR) 240 MG CR tablet Take 1 tablet (240 mg total) by mouth every morning. NEEDS APPT.   ondansetron (ZOFRAN ODT) 4 MG disintegrating tablet Take 1 tablet (4 mg total) by mouth every 8 (eight) hours as needed for nausea or vomiting. (Patient not taking: Reported on 08/01/2020)   oxyCODONE-acetaminophen (PERCOCET) 5-325 MG tablet Take 1 tablet by mouth every 6 (six) hours as needed for severe pain. (Patient not taking: Reported on 08/01/2020)   Phenyleph-CPM-DM-APAP 05-09-08-325 MG CAPS Take 1-2 capsules by mouth daily as needed (allergies/cold symptoms).  (Patient not taking: Reported on 08/01/2020)   tetrahydrozoline 0.05 % ophthalmic solution Place 1-2 drops into both eyes 3 (three) times daily as needed (irritated/dry eyes.). (Patient not taking: Reported on 08/01/2020)   No facility-administered encounter medications on file as of 08/01/2020.     Review of Systems  Constitutional:  Positive for fatigue. Negative for chills and fever.  HENT:  Negative for congestion, rhinorrhea and sore throat.   Respiratory:  Negative for cough, shortness of breath and wheezing.   Cardiovascular:  Negative for chest pain and leg swelling.  Gastrointestinal:  Negative for abdominal pain, diarrhea, nausea and vomiting.  Genitourinary:  Negative for dysuria and frequency.  Musculoskeletal:  Negative for arthralgias and back pain.  Skin:  Negative for rash.  Neurological:  Negative for dizziness, weakness and headaches.  Hematological:  Bruises/bleeds easily.    Vitals BP (!) 158/111   Pulse (!) 112   Temp 97.7 F (36.5 C)   Ht '5\' 5"'  (1.651 m)   Wt 260 lb (117.9 kg)   SpO2 97%   BMI 43.27 kg/m   Objective:   Physical Exam Vitals and nursing note reviewed.  Constitutional:      General: She is not in acute distress.    Appearance: Normal appearance. She is not ill-appearing.  HENT:     Head:  Normocephalic and atraumatic.  Eyes:     Extraocular Movements: Extraocular movements intact.     Conjunctiva/sclera: Conjunctivae normal.     Pupils: Pupils are equal, round, and reactive to light.  Cardiovascular:     Rate and Rhythm: Regular rhythm. Tachycardia present.     Pulses: Normal pulses.     Heart sounds: Normal heart sounds.  Pulmonary:     Effort: Pulmonary effort is normal.     Breath sounds: Normal breath sounds. No wheezing, rhonchi or rales.  Musculoskeletal:        General: Normal range of motion.     Right lower leg: No edema.     Left lower leg: No edema.  Skin:    General: Skin is warm and dry.     Findings: Bruising (2 small area on left index and left middle finger) present. No erythema, lesion or rash.  Neurological:     General: No focal deficit present.     Mental Status: She is alert and oriented to person, place, and time.     Cranial Nerves: No cranial nerve deficit.  Psychiatric:        Mood and Affect: Mood normal.        Behavior: Behavior normal.     Assessment and Plan   1. Essential hypertension, benign - CBC  2. Diabetes mellitus without complication (HCC) - WKM62+MMNO - Hemoglobin A1c - Microalbumin, urine  3. Hyperlipidemia LDL goal <100 - CMP14+EGFR - Lipid panel  4. Fatigue, unspecified type - CBC - TSH  5. Easy bruising   Pt having some easy bruising- pt taking 349m asa and likely related to this.  Will check labs.  Call pt with labs.  Hnt- uncontrollled.  Pt to take bp mds as soon as she gets her med from pharmacy.  Dm2- recheck labs. Hld- recheck labs. Fatigue- check cmp, cbc, tsh.  Tachycardia and htn- pt hasn't had her meds for a few days.  Pt didn't get from pharmacy yet.  H/o rt breast cancer had surgery and radiation.  Doing well, no further tx at this time.  Return if symptoms worsen or fail to improve.

## 2020-08-01 NOTE — Progress Notes (Deleted)
   Patient ID: ARLIENE TOMEK, female    DOB: [birth year], 60 y.o.   MRN: [id number]   No chief complaint on file.  Subjective:    HPI   Medical History Rediet has a past medical history of Anxiety, Cancer ([place]) (09/2019), COPD (chronic obstructive pulmonary disease) ([place]), CVA (cerebral vascular accident) ([place]) (07/2013), Depression, High cholesterol, Hypertension, Smoker, and Type II diabetes mellitus ([place]).   Outpatient Encounter Medications as of 08/01/2020  Medication Sig   acetaminophen (TYLENOL) 500 MG tablet Take 1,000 mg by mouth every 6 (six) hours as needed for mild pain or moderate pain.    aspirin 325 MG tablet Take 1 tablet (325 mg total) by mouth daily.   atorvastatin (LIPITOR) 20 MG tablet TAKE 1 TABLET EVERY DAY   clonazePAM (KLONOPIN) 0.5 MG tablet Take one qhs prn sleep (Patient taking differently: Take 0.5 mg by mouth at bedtime as needed (sleep).)   escitalopram (LEXAPRO) 20 MG tablet TAKE 1 TABLET EVERY DAY   guaifenesin (ROBITUSSIN) 100 MG/5ML syrup Take 200 mg by mouth 4 (four) times daily as needed for cough (cough/allergies/cold symptoms).    lisinopril-hydrochlorothiazide (ZESTORETIC) 20-12.5 MG tablet TAKE 1 TABLET EVERY DAY   metFORMIN (GLUCOPHAGE) 1000 MG tablet Take 1 tablet (1,000 mg total) by mouth 2 (two) times daily.   ondansetron (ZOFRAN ODT) 4 MG disintegrating tablet Take 1 tablet (4 mg total) by mouth every 8 (eight) hours as needed for nausea or vomiting.   oxyCODONE-acetaminophen (PERCOCET) 5-325 MG tablet Take 1 tablet by mouth every 6 (six) hours as needed for severe pain.   Phenyleph-CPM-DM-APAP 05-09-08-325 MG CAPS Take 1-2 capsules by mouth daily as needed (allergies/cold symptoms).    tetrahydrozoline 0.05 % ophthalmic solution Place 1-2 drops into both eyes 3 (three) times daily as needed (irritated/dry eyes.).   verapamil (CALAN-SR) 240 MG CR tablet Take 1 tablet (240 mg total) by mouth every morning. NEEDS APPT.   No facility-administered  encounter medications on file as of 08/01/2020.     Review of Systems   Vitals BP (!) 158/111   Pulse (!) 112   Temp 97.7 F (36.5 C)   Ht '5\' 5"'$  (1.651 m)   Wt 260 lb (117.9 kg)   SpO2 97%   BMI 43.27 kg/m   Objective:   Physical Exam   Assessment and Plan   There are no diagnoses linked to this encounter.     No follow-ups on file.

## 2020-08-02 LAB — CBC
Hematocrit: 40.6 % (ref 34.0–46.6)
Hemoglobin: 13.9 g/dL (ref 11.1–15.9)
MCH: 32.6 pg (ref 26.6–33.0)
MCHC: 34.2 g/dL (ref 31.5–35.7)
MCV: 95 fL (ref 79–97)
Platelets: 307 10*3/uL (ref 150–450)
RBC: 4.27 x10E6/uL (ref 3.77–5.28)
RDW: 12.5 % (ref 11.7–15.4)
WBC: 7.4 10*3/uL (ref 3.4–10.8)

## 2020-08-02 LAB — CMP14+EGFR
ALT: 36 IU/L — ABNORMAL HIGH (ref 0–32)
AST: 30 IU/L (ref 0–40)
Albumin/Globulin Ratio: 2 (ref 1.2–2.2)
Albumin: 4.4 g/dL (ref 3.8–4.9)
Alkaline Phosphatase: 96 IU/L (ref 44–121)
BUN/Creatinine Ratio: 11 (ref 9–23)
BUN: 9 mg/dL (ref 6–24)
Bilirubin Total: 0.4 mg/dL (ref 0.0–1.2)
CO2: 22 mmol/L (ref 20–29)
Calcium: 9.3 mg/dL (ref 8.7–10.2)
Chloride: 93 mmol/L — ABNORMAL LOW (ref 96–106)
Creatinine, Ser: 0.81 mg/dL (ref 0.57–1.00)
Globulin, Total: 2.2 g/dL (ref 1.5–4.5)
Glucose: 149 mg/dL — ABNORMAL HIGH (ref 65–99)
Potassium: 5.4 mmol/L — ABNORMAL HIGH (ref 3.5–5.2)
Sodium: 130 mmol/L — ABNORMAL LOW (ref 134–144)
Total Protein: 6.6 g/dL (ref 6.0–8.5)
eGFR: 84 mL/min/{1.73_m2} (ref >59)

## 2020-08-02 LAB — HEMOGLOBIN A1C
Est. average glucose Bld gHb Est-mCnc: 137 mg/dL
Hgb A1c MFr Bld: 6.4 % — ABNORMAL HIGH (ref 4.8–5.6)

## 2020-08-02 LAB — LIPID PANEL
Chol/HDL Ratio: 3.8 ratio (ref 0.0–4.4)
Cholesterol, Total: 217 mg/dL — ABNORMAL HIGH (ref 100–199)
HDL: 57 mg/dL (ref >39)
LDL Chol Calc (NIH): 132 mg/dL — ABNORMAL HIGH (ref 0–99)
Triglycerides: 161 mg/dL — ABNORMAL HIGH (ref 0–149)
VLDL Cholesterol Cal: 28 mg/dL (ref 5–40)

## 2020-08-02 LAB — MICROALBUMIN, URINE: Microalbumin, Urine: 160.7 ug/mL

## 2020-08-02 LAB — TSH: TSH: 1.42 u[IU]/mL (ref 0.450–4.500)

## 2020-08-02 NOTE — Progress Notes (Signed)
Left message for a return call for details of drs lab results.

## 2020-08-12 ENCOUNTER — Other Ambulatory Visit: Payer: Self-pay | Admitting: Family Medicine

## 2020-08-12 DIAGNOSIS — F321 Major depressive disorder, single episode, moderate: Secondary | ICD-10-CM

## 2020-08-12 DIAGNOSIS — I63312 Cerebral infarction due to thrombosis of left middle cerebral artery: Secondary | ICD-10-CM

## 2020-08-17 ENCOUNTER — Telehealth: Payer: Self-pay | Admitting: Family Medicine

## 2020-08-17 ENCOUNTER — Other Ambulatory Visit: Payer: Self-pay

## 2020-08-17 ENCOUNTER — Telehealth (INDEPENDENT_AMBULATORY_CARE_PROVIDER_SITE_OTHER): Payer: Medicare HMO | Admitting: Family Medicine

## 2020-08-17 DIAGNOSIS — J019 Acute sinusitis, unspecified: Secondary | ICD-10-CM | POA: Diagnosis not present

## 2020-08-17 MED ORDER — AMOXICILLIN-POT CLAVULANATE 875-125 MG PO TABS: ORAL_TABLET | ORAL | 0 refills | Status: DC

## 2020-08-17 NOTE — Telephone Encounter (Signed)
Ms. jaelene, dech are scheduled for a virtual visit with your provider today.    Just as we do with appointments in the office, we must obtain your consent to participate.  Your consent will be active for this visit and any virtual visit you may have with one of our providers in the next 365 days.    If you have a MyChart account, I can also send a copy of this consent to you electronically.  All virtual visits are billed to your insurance company just like a traditional visit in the office.  As this is a virtual visit, video technology does not allow for your provider to perform a traditional examination.  This may limit your provider's ability to fully assess your condition.  If your provider identifies any concerns that need to be evaluated in person or the need to arrange testing such as labs, EKG, etc, we will make arrangements to do so.    Although advances in technology are sophisticated, we cannot ensure that it will always work on either your end or our end.  If the connection with a video visit is poor, we may have to switch to a telephone visit.  With either a video or telephone visit, we are not always able to ensure that we have a secure connection.   I need to obtain your verbal consent now.   Are you willing to proceed with your visit today?   YATZIRY VILE has provided verbal consent on 08/17/2020 for a virtual visit (video or telephone).   Vicente Males, LPN [id number]  [id number]

## 2020-08-17 NOTE — Progress Notes (Signed)
   Subjective:    Patient ID: Shannon Price, female    DOB: 11/06/1960, 60 y.o.   MRN: [id number]  HPI Initially was a virtual visit but because of the complexities of her illness she was brought to the office door entrance  Patient presents today with respiratory illness Number of days present- a week and a half ago; pt went to [place] to take grand children back home for school  Symptoms include- bad cold symptoms, stomach cramping, dry cough,chest feels tight  Presence of worrisome signs (severe shortness of breath, lethargy, etc.) - chest tightness  Recent/current visit to urgent care or ER-no  Recent direct exposure to Covid-yes, husband  Any current Covid testing- at home test negative   History of Present Illness: Patient recently traveled to [place].  Came back with head congestion drainage coughing some chest congestion denies wheezing denies difficulty breathing no vomiting or diarrhea energy level low symptoms been progressive over the past 10 days has not done a COVID test.  PMH benign   Observations/Objective:   Assessment and Plan:   Follow Up Instructions:    I discussed the assessment and treatment plan with the patient. The patient was provided an opportunity to ask questions and all were answered. The patient agreed with the plan and demonstrated an understanding of the instructions.   Vicente Males, LPN    Review of Systems     Objective:   Physical Exam  Lungs are clear bilateral O2 saturation 99% not tachypneic ears are normal sinus mild congestion      Assessment & Plan:  Viral syndrome COVID test taken Probable sinusitis Antibiotic prescribed Stay at home next few days until test result is found Warning signs discussed follow-up if problems

## 2020-08-18 LAB — SARS-COV-2, NAA 2 DAY TAT

## 2020-08-18 LAB — NOVEL CORONAVIRUS, NAA: SARS-CoV-2, NAA: DETECTED — AB

## 2020-09-24 ENCOUNTER — Other Ambulatory Visit: Payer: Self-pay | Admitting: Family Medicine

## 2020-10-18 ENCOUNTER — Emergency Department (HOSPITAL_COMMUNITY)
Admission: EM | Admit: 2020-10-18 | Discharge: 2020-10-18 | Disposition: A | Discharge status: Home / Self Care | Payer: Medicare HMO | Attending: Emergency Medicine | Admitting: Emergency Medicine

## 2020-10-18 ENCOUNTER — Other Ambulatory Visit: Payer: Self-pay

## 2020-10-18 ENCOUNTER — Encounter (HOSPITAL_COMMUNITY): Payer: Self-pay | Admitting: *Deleted

## 2020-10-18 ENCOUNTER — Emergency Department (HOSPITAL_COMMUNITY): Payer: Medicare HMO

## 2020-10-18 DIAGNOSIS — Z7982 Long term (current) use of aspirin: Secondary | ICD-10-CM | POA: Insufficient documentation

## 2020-10-18 DIAGNOSIS — F17210 Nicotine dependence, cigarettes, uncomplicated: Secondary | ICD-10-CM | POA: Diagnosis not present

## 2020-10-18 DIAGNOSIS — M542 Cervicalgia: Secondary | ICD-10-CM | POA: Insufficient documentation

## 2020-10-18 DIAGNOSIS — Z79899 Other long term (current) drug therapy: Secondary | ICD-10-CM | POA: Insufficient documentation

## 2020-10-18 DIAGNOSIS — Z7984 Long term (current) use of oral hypoglycemic drugs: Secondary | ICD-10-CM | POA: Diagnosis not present

## 2020-10-18 DIAGNOSIS — I10 Essential (primary) hypertension: Secondary | ICD-10-CM | POA: Diagnosis not present

## 2020-10-18 DIAGNOSIS — Z853 Personal history of malignant neoplasm of breast: Secondary | ICD-10-CM | POA: Diagnosis not present

## 2020-10-18 DIAGNOSIS — J449 Chronic obstructive pulmonary disease, unspecified: Secondary | ICD-10-CM | POA: Insufficient documentation

## 2020-10-18 DIAGNOSIS — E119 Type 2 diabetes mellitus without complications: Secondary | ICD-10-CM | POA: Insufficient documentation

## 2020-10-18 MED ORDER — KETOROLAC TROMETHAMINE 30 MG/ML IJ SOLN
60.0000 mg | Freq: Once | INTRAMUSCULAR | Status: AC
  Administered 2020-10-18: 60 mg via INTRAMUSCULAR
  Filled 2020-10-18: qty 2

## 2020-10-18 MED ORDER — PREDNISONE 10 MG PO TABS: 20.0000 mg | ORAL_TABLET | Freq: Every day | ORAL | 0 refills | Status: DC

## 2020-10-18 MED ORDER — OXYCODONE-ACETAMINOPHEN 5-325 MG PO TABS
1.0000 | ORAL_TABLET | Freq: Four times a day (QID) | ORAL | 0 refills | Status: DC | PRN
Start: 2020-10-18 — End: 2020-10-28

## 2020-10-18 NOTE — Discharge Instructions (Addendum)
Follow-up with Dr. Ellene Route neurosurgery or follow-up with your family doctor in the next couple weeks for recheck

## 2020-10-18 NOTE — ED Triage Notes (Signed)
Pain in right side of neck for over a week, denies injury

## 2020-10-18 NOTE — ED Provider Notes (Signed)
[facility] EMERGENCY DEPARTMENT Provider Note   CSN: [id number] Arrival date & time: 10/18/20  1408     History Chief Complaint  Patient presents with   Neck Pain    Shannon Price is a 60 y.o. female.  Patient with right-sided neck pain radiating down her right arm  The history is provided by the patient and medical records. No language interpreter was used.  Neck Pain Pain location:  Generalized neck Quality:  Aching Pain radiates to:  R forearm Pain severity:  Moderate Pain is:  Worse during the day Onset quality:  Sudden Timing:  Constant Progression:  Waxing and waning Chronicity:  New Relieved by:  Nothing Worsened by:  Nothing Associated symptoms: no chest pain and no headaches       Past Medical History:  Diagnosis Date   Anxiety    Cancer ([place]) 09/2019   right breast IDC   COPD (chronic obstructive pulmonary disease) (HCC)    denies   CVA (cerebral vascular accident) ([place]) 07/2013   short term memory loss, no physical deficits   Depression    High cholesterol    Hypertension    Smoker    1-11/2pps   Type II diabetes mellitus ([place])     Patient Active Problem List   Diagnosis Date Noted   Pre-operative clearance 11/03/2019   Malignant neoplasm of upper-outer quadrant of right breast in female, estrogen receptor positive ([place]) 10/01/2019   Breast mass, right 09/25/2019   Community acquired pneumonia of left lower lobe of lung 03/08/2018   Special screening for malignant neoplasms, colon    Morbid obesity ([place]) 05/04/2017   Hidradenitis suppurativa 02/01/2017   Major depressive disorder, recurrent episode, moderate ([place]) 05/09/2016   Depression, major, single episode, moderate ([place]) 04/28/2016   Cervical spondylosis with myelopathy 08/09/2015   Cerebral infarction due to thrombosis of left middle cerebral artery ([place]) 10/13/2014   Insomnia 11/03/2013   Tension headache, chronic 11/03/2013   Diabetes mellitus without complication ([place]) 19/50/9326    Essential hypertension, benign 08/05/2013   Hyperlipidemia LDL goal <100 08/05/2013   Slurred speech 07/21/2013   Lacunar infarct, acute ([place]) 07/21/2013   Tobacco abuse 07/21/2013    Past Surgical History:  Procedure Laterality Date   ANTERIOR CERVICAL DECOMP/DISCECTOMY FUSION N/A 08/09/2015   Procedure: Cervical Four- Five, Cervical Five-Six  Anterior cervical decompression/diskectomy/fusion/plate fixation;  Surgeon: Kevan Ny Ditty, MD;  Location: [facility];  Service: Neurosurgery;  Laterality: N/A;  right side approach   BREAST LUMPECTOMY WITH RADIOACTIVE SEED AND SENTINEL LYMPH NODE BIOPSY Right 11/24/2019   Procedure: RIGHT BREAST LUMPECTOMY X 2 WITH RADIOACTIVE SEED AND RIGHT AXILLARY SENTINEL LYMPH NODE BIOPSY, BLUE DYE INJECTION;  Surgeon: Donnie Mesa, MD;  Location: [facility];  Service: General;  Laterality: Right;  LMA, PEC BLOCK, BLUE DYE INJECTION   CESAREAN SECTION  1982   COLONOSCOPY N/A 08/30/2017   Procedure: COLONOSCOPY;  Surgeon: Danie Binder, MD;  Location: [facility];  Service: Endoscopy;  Laterality: N/A;  2:00   POLYPECTOMY  08/30/2017   Procedure: POLYPECTOMY;  Surgeon: Danie Binder, MD;  Location: [facility];  Service: Endoscopy;;  hepatic flexure polyp hot snare Descending colon polyps x 2 hot snare Rectal polyps x 2 hot snare   RE-EXCISION OF BREAST CANCER,SUPERIOR MARGINS Right 12/15/2019   Procedure: RE-EXCISION OF ANTERIOR AND SUPERIOR MARGINS RIGHT BREAST LUMPECTOMY;  Surgeon: Donnie Mesa, MD;  Location: [facility];  Service: General;  Laterality: Right;  LMA  OB History   No obstetric history on file.     Family History  Problem Relation Age of Onset   Hypertension Mother    Hypertension Father     Social History   Tobacco Use   Smoking status: Every Day    Packs/day: 1.00    Years: 43.00    Pack years: 43.00    Types: Cigarettes   Smokeless tobacco: Never   Tobacco comments:    just recently reduced  cigs from 2 ppd  Vaping Use   Vaping Use: Never used  Substance Use Topics   Alcohol use: Yes    Comment: occasional   Drug use: No    Comment: 05-09-2016 per pt no    Home Medications Prior to Admission medications   Medication Sig Start Date End Date Taking? Authorizing Provider  acetaminophen (TYLENOL) 500 MG tablet Take 1,000 mg by mouth every 6 (six) hours as needed for mild pain or moderate pain.    Yes [provider]  aspirin 325 MG tablet Take 1 tablet (325 mg total) by mouth daily. 10/13/14  Yes Rosalin Hawking, MD  atorvastatin (LIPITOR) 20 MG tablet Take 1 tablet (20 mg total) by mouth daily. 08/15/20  Yes Lovena Le, Malena M, DO  clonazePAM (KLONOPIN) 0.5 MG tablet Take one qhs prn sleep Patient taking differently: Take 0.5 mg by mouth at bedtime as needed (sleep). 09/19/18  Yes Mikey Kirschner, MD  guaifenesin (ROBITUSSIN) 100 MG/5ML syrup Take 200 mg by mouth 4 (four) times daily as needed for cough (cough/allergies/cold symptoms).    Yes [provider]  Ibuprofen (MOTRIN PO) Take 600 mg by mouth daily as needed (pain).   Yes [provider]  lisinopril-hydrochlorothiazide (ZESTORETIC) 20-12.5 MG tablet Take 1 tablet by mouth daily. 08/01/20  Yes Lovena Le, Malena M, DO  metFORMIN (GLUCOPHAGE) 1000 MG tablet Take 1 tablet by mouth twice daily 09/26/20  Yes Nilda Simmer, NP  oxyCODONE-acetaminophen (PERCOCET) 5-325 MG tablet Take 1 tablet by mouth every 6 (six) hours as needed for moderate pain. 10/18/20  Yes [name], MD  predniSONE (DELTASONE) 10 MG tablet Take 2 tablets (20 mg total) by mouth daily. 10/18/20  Yes [name], MD  verapamil (CALAN-SR) 240 MG CR tablet Take 1 tablet (240 mg total) by mouth daily. 08/15/20  Yes Lovena Le, Malena M, DO  amoxicillin-clavulanate (AUGMENTIN) 875-125 MG tablet Take one tablet po twice daily for 7 days Patient not taking: No sig reported 08/17/20   Kathyrn Drown, MD  escitalopram (LEXAPRO) 20 MG tablet TAKE 1  TABLET EVERY DAY 08/15/20   Elvia Collum M, DO    Allergies    Patient has no known allergies.  Review of Systems   Review of Systems  Constitutional:  Negative for appetite change and fatigue.  HENT:  Negative for congestion, ear discharge and sinus pressure.        Neck pain  Eyes:  Negative for discharge.  Respiratory:  Negative for cough.   Cardiovascular:  Negative for chest pain.  Gastrointestinal:  Negative for abdominal pain and diarrhea.  Genitourinary:  Negative for frequency and hematuria.  Musculoskeletal:  Positive for neck pain. Negative for back pain.  Skin:  Negative for rash.  Neurological:  Negative for seizures and headaches.  Psychiatric/Behavioral:  Negative for hallucinations.    Physical Exam Updated Vital Signs BP (!) 162/75   Pulse 92   Temp 98.2 F (36.8 C) (Oral)   Resp 18   SpO2 100%  Physical Exam Vitals and nursing note reviewed.  Constitutional:      Appearance: She is well-developed.  HENT:     Head: Normocephalic.     Nose: Nose normal.  Eyes:     General: No scleral icterus.    Conjunctiva/sclera: Conjunctivae normal.  Neck:     Thyroid: No thyromegaly.  Cardiovascular:     Rate and Rhythm: Normal rate and regular rhythm.     Heart sounds: No murmur heard.   No friction rub. No gallop.  Pulmonary:     Breath sounds: No stridor. No wheezing or rales.  Chest:     Chest wall: No tenderness.  Abdominal:     General: There is no distension.     Tenderness: There is no abdominal tenderness. There is no rebound.  Musculoskeletal:        General: Normal range of motion.     Cervical back: Neck supple.     Comments: Tenderness to right side of neck  Lymphadenopathy:     Cervical: No cervical adenopathy.  Skin:    Findings: No erythema or rash.  Neurological:     Mental Status: She is alert and oriented to person, place, and time.     Motor: No abnormal muscle tone.     Coordination: Coordination normal.  Psychiatric:         Behavior: Behavior normal.    ED Results / Procedures / Treatments   Labs (all labs ordered are listed, but only abnormal results are displayed) Labs Reviewed - No data to display  EKG None  Radiology CT Cervical Spine Wo Contrast  Result Date: 10/18/2020 CLINICAL DATA:  Neck pain for 1 week, no history of injury EXAM: CT CERVICAL SPINE WITHOUT CONTRAST TECHNIQUE: Multidetector CT imaging of the cervical spine was performed without intravenous contrast. Multiplanar CT image reconstructions were also generated. COMPARISON:  None. FINDINGS: Alignment: Alignment is anatomic. Skull base and vertebrae: No acute fracture. No primary bone lesion or focal pathologic process. Soft tissues and spinal canal: No prevertebral fluid or swelling. No visible canal hematoma. Disc levels: Previous C4-C6 ACDF. Evaluation the central canal is limited by the lack of intrathecal contrast as well as streak artifact from orthopedic hardware. It appears that there is a significant disc bulge at the C6-7 level with at least mild to moderate central canal stenosis. Please correlate with physical exam findings. If further evaluation is desired, follow-up MRI or CT myelogram could be considered. Upper chest: Airway is patent.  Lung apices are clear. Other: Reconstructed images demonstrate no additional findings. IMPRESSION: 1. No acute cervical spine fracture. 2. Probable disc bulge at C6-7 with resulting central canal stenosis. Evaluation limited without intrathecal contrast. If radicular symptoms exist in this distribution, follow-up MRI or CT myelogram could be considered. Electronically Signed   By: Randa Ngo M.D.   On: 10/18/2020 17:27    Procedures Procedures   Medications Ordered in ED Medications  ketorolac (TORADOL) 30 MG/ML injection 60 mg (60 mg Intramuscular Given 10/18/20 1723)    ED Course  I have reviewed the triage vital signs and the nursing notes.  Pertinent labs & imaging results that were  available during my care of the patient were reviewed by me and considered in my medical decision making (see chart for details).    MDM Rules/Calculators/A&P                           Patient with neck pain  and possible disc protrusions in the cervical spine.  She is to follow-up with neurosurgery and is started on prednisone and pain medicine Final Clinical Impression(s) / ED Diagnoses Final diagnoses:  Neck pain    Rx / DC Orders ED Discharge Orders          Ordered    predniSONE (DELTASONE) 10 MG tablet  Daily        10/18/20 1811    oxyCODONE-acetaminophen (PERCOCET) 5-325 MG tablet  Every 6 hours PRN        10/18/20 1811             [name], MD 10/25/20 1715

## 2020-10-28 ENCOUNTER — Ambulatory Visit (INDEPENDENT_AMBULATORY_CARE_PROVIDER_SITE_OTHER): Payer: Medicare HMO | Admitting: Family Medicine

## 2020-10-28 ENCOUNTER — Telehealth: Payer: Self-pay | Admitting: Family Medicine

## 2020-10-28 ENCOUNTER — Other Ambulatory Visit: Payer: Self-pay

## 2020-10-28 ENCOUNTER — Telehealth: Payer: Self-pay | Admitting: Hematology and Oncology

## 2020-10-28 ENCOUNTER — Encounter: Payer: Self-pay | Admitting: Family Medicine

## 2020-10-28 VITALS — BP 142/78 | HR 91 | Temp 96.9°F | Ht 65.0 in | Wt 257.6 lb

## 2020-10-28 DIAGNOSIS — E785 Hyperlipidemia, unspecified: Secondary | ICD-10-CM

## 2020-10-28 DIAGNOSIS — I10 Essential (primary) hypertension: Secondary | ICD-10-CM

## 2020-10-28 DIAGNOSIS — C50411 Malignant neoplasm of upper-outer quadrant of right female breast: Secondary | ICD-10-CM

## 2020-10-28 DIAGNOSIS — Z170 Estrogen receptor positive status [ER+]: Secondary | ICD-10-CM

## 2020-10-28 DIAGNOSIS — R5383 Other fatigue: Secondary | ICD-10-CM

## 2020-10-28 DIAGNOSIS — M542 Cervicalgia: Secondary | ICD-10-CM

## 2020-10-28 DIAGNOSIS — E119 Type 2 diabetes mellitus without complications: Secondary | ICD-10-CM | POA: Diagnosis not present

## 2020-10-28 DIAGNOSIS — Z23 Encounter for immunization: Secondary | ICD-10-CM

## 2020-10-28 DIAGNOSIS — Z79899 Other long term (current) drug therapy: Secondary | ICD-10-CM

## 2020-10-28 MED ORDER — ATORVASTATIN CALCIUM 80 MG PO TABS: 80.0000 mg | ORAL_TABLET | Freq: Every day | ORAL | 3 refills | Status: DC

## 2020-10-28 NOTE — Assessment & Plan Note (Signed)
BP has been stable.  Slightly elevated today.  We will continue to monitor.  Continue lisinopril/HCTZ as well as verapamil.

## 2020-10-28 NOTE — Assessment & Plan Note (Signed)
Staff reached out to the cancer center.  They will be contacting the patient to get her on the schedule for an appointment and to get her mammogram done.

## 2020-10-28 NOTE — Telephone Encounter (Signed)
Appt scheduled and message left with pt

## 2020-10-28 NOTE — Assessment & Plan Note (Signed)
Currently resolved.  If recurs, will refer to neurosurgery given prior history as well as CT findings.

## 2020-10-28 NOTE — Patient Instructions (Addendum)
I have increased the lipitor.  Be sure to get your mammogram. They will be calling.  Flu shot today.  Follow up with [facility] as soon as you can for a pap smear.  Follow up with me in 6 months.  Dr. Lacinda Axon

## 2020-10-28 NOTE — Assessment & Plan Note (Signed)
Uncontrolled.  Increasing Lipitor to 80 mg daily.

## 2020-10-28 NOTE — Telephone Encounter (Signed)
Last Labs 08/01/20:  CBC, CMP, Lipid, HgbA1c, Microalbumin Urine, TSH

## 2020-10-28 NOTE — Assessment & Plan Note (Signed)
At goal.  Continue metformin.  Diabetic foot exam was performed today.

## 2020-10-28 NOTE — Progress Notes (Signed)
Subjective:  Patient ID: Shannon Price, female    DOB: 25-Oct-1960  Age: 60 y.o. MRN: [id number]  CC: Chief Complaint  Patient presents with   Establish Care    Pt recently had severe neck pain and went to ER. Pt was given pain meds and states that she is not having much pain at this time.     HPI:  60 year old female presents for follow-up and to establish care with me.  Neck pain Has a history of cervical spondylosis with myelopathy.  Status post ACDF C4-6. Recent ER visit on 10/11.  CT cervical spine was obtained and revealed disc bulge at C6-C7 with central canal stenosis. Patient was treated with pain medication and prednisone. She states that she has gotten back to her normal self.  She has no pain at this time.  Has not seen neurosurgery since having prior surgery.  Hypertension BP slightly elevated today.  Currently on lisinopril/HCTZ,and verapamil.  History of breast cancer S/p lumpectomy and radiation.  Patient has not been seen recently.  She has not had her mammogram this year either.  Hyperlipidemia Uncontrolled.  Last LDL 132. She is currently on atorvastatin 20 mg daily.  She states that she is tolerating the medication without difficulty.  DM2 Has been well controlled as of late.  Last A1c was 6.4.  She is currently on metformin 1000 mg twice daily.  Patient Active Problem List   Diagnosis Date Noted   Neck pain 10/28/2020   Malignant neoplasm of upper-outer quadrant of right breast in female, estrogen receptor positive ([place]) 10/01/2019   Morbid obesity ([place]) 05/04/2017   Hidradenitis suppurativa 02/01/2017   Major depressive disorder, recurrent episode, moderate ([place]) 05/09/2016   Cerebral infarction due to thrombosis of left middle cerebral artery ([place]) 10/13/2014   Insomnia 11/03/2013   Tension headache, chronic 11/03/2013   Diabetes mellitus without complication ([place]) 62/83/6629   Essential hypertension, benign 08/05/2013   Hyperlipidemia 08/05/2013    Lacunar infarct, acute ([place]) 07/21/2013   Tobacco abuse 07/21/2013    Social Hx   Social History   Socioeconomic History   Marital status: Married    Spouse name: Not on file   Number of children: 2   Years of education: 10th   Highest education level: Not on file  Occupational History   Occupation: N/A  Tobacco Use   Smoking status: Every Day    Packs/day: 1.00    Years: 43.00    Pack years: 43.00    Types: Cigarettes   Smokeless tobacco: Never   Tobacco comments:    just recently reduced cigs from 2 ppd  Vaping Use   Vaping Use: Never used  Substance and Sexual Activity   Alcohol use: Yes    Comment: occasional   Drug use: No    Comment: 05-09-2016 per pt no   Sexual activity: Yes    Birth control/protection: Post-menopausal  Other Topics Concern   Not on file  Social History Narrative   Patient is married with 2 children.   Patient is right handed.   Patient has 10 th grade education.   Patient drinks 5 or more daily.   Social Determinants of Health   Financial Resource Strain: Not on file  Food Insecurity: Not on file  Transportation Needs: Not on file  Physical Activity: Not on file  Stress: Not on file  Social Connections: Not on file   Review of Systems  Constitutional: Negative.   Musculoskeletal:  Recent neck pain; now resolved.    Objective:  BP (!) 142/78   Pulse 91   Temp (!) 96.9 F (36.1 C)   Ht 5\' 5"  (1.651 m)   Wt 257 lb 9.6 oz (116.8 kg)   SpO2 99%   BMI 42.87 kg/m   BP/Weight 10/28/2020 10/18/2020 4/94/4967  Systolic BP 591 638 466  Diastolic BP 78 75 599  Wt. (Lbs) 257.6 - 260  BMI 42.87 - 43.27  Some encounter information is confidential and restricted. Go to Review Flowsheets activity to see all data.    Physical Exam Vitals and nursing note reviewed.  Constitutional:      General: She is not in acute distress.    Appearance: Normal appearance. She is obese.  HENT:     Head: Normocephalic and atraumatic.   Eyes:     General:        Right eye: No discharge.        Left eye: No discharge.     Conjunctiva/sclera: Conjunctivae normal.  Cardiovascular:     Rate and Rhythm: Normal rate and regular rhythm.  Pulmonary:     Effort: Pulmonary effort is normal.     Breath sounds: Normal breath sounds. No wheezing or rales.  Abdominal:     General: There is no distension.     Palpations: Abdomen is soft.     Tenderness: There is no abdominal tenderness.  Neurological:     Mental Status: She is alert.  Psychiatric:        Mood and Affect: Mood normal.        Behavior: Behavior normal.  Diabetic Foot Check -  Appearance - no lesions, ulcers or calluses Skin - no unusual pallor or redness Monofilament testing -  Right - Great toe, medial, central, lateral ball and posterior foot intact Left - Great toe, medial, central, lateral ball and posterior foot intact   Lab Results  Component Value Date   WBC 7.4 08/01/2020   HGB 13.9 08/01/2020   HCT 40.6 08/01/2020   PLT 307 08/01/2020   GLUCOSE 149 (H) 08/01/2020   CHOL 217 (H) 08/01/2020   TRIG 161 (H) 08/01/2020   HDL 57 08/01/2020   LDLCALC 132 (H) 08/01/2020   ALT 36 (H) 08/01/2020   AST 30 08/01/2020   NA 130 (L) 08/01/2020   K 5.4 (H) 08/01/2020   CL 93 (L) 08/01/2020   CREATININE 0.81 08/01/2020   BUN 9 08/01/2020   CO2 22 08/01/2020   TSH 1.420 08/01/2020   INR 0.97 07/21/2013   HGBA1C 6.4 (H) 08/01/2020     Assessment & Plan:   Problem List Items Addressed This Visit       Cardiovascular and Mediastinum   Essential hypertension, benign - Primary    BP has been stable.  Slightly elevated today.  We will continue to monitor.  Continue lisinopril/HCTZ as well as verapamil.      Relevant Medications   atorvastatin (LIPITOR) 80 MG tablet     Endocrine   Diabetes mellitus without complication (HCC)    At goal.  Continue metformin.  Diabetic foot exam was performed today.      Relevant Medications   atorvastatin  (LIPITOR) 80 MG tablet     Other   Hyperlipidemia    Uncontrolled.  Increasing Lipitor to 80 mg daily.      Relevant Medications   atorvastatin (LIPITOR) 80 MG tablet   Malignant neoplasm of upper-outer quadrant of right breast in female, estrogen  receptor positive [facility])    Staff reached out to the cancer center.  They will be contacting the patient to get her on the schedule for an appointment and to get her mammogram done.      Neck pain    Currently resolved.  If recurs, will refer to neurosurgery given prior history as well as CT findings.      Other Visit Diagnoses     Need for vaccination       Relevant Orders   Flu Vaccine QUAD 6+ mos PF IM (Fluarix Quad PF) (Completed)       Meds ordered this encounter  Medications   atorvastatin (LIPITOR) 80 MG tablet    Sig: Take 1 tablet (80 mg total) by mouth daily.    Dispense:  90 tablet    Refill:  3    Follow-up:  Return Needs follow up with Hoyle Sauer for Pap smear.  [place]

## 2020-10-28 NOTE — Telephone Encounter (Signed)
Patient has physical on 11/18/20 and needing labs

## 2020-10-31 ENCOUNTER — Other Ambulatory Visit: Payer: Self-pay

## 2020-10-31 MED ORDER — METFORMIN HCL 1000 MG PO TABS
1000.0000 mg | ORAL_TABLET | Freq: Two times a day (BID) | ORAL | 0 refills | Status: DC
Start: 2020-10-31 — End: 2021-01-30

## 2020-10-31 NOTE — Telephone Encounter (Signed)
Pt returned call and verbalized understanding  

## 2020-10-31 NOTE — Telephone Encounter (Signed)
Blood work ordered in Epic. Left message to return call 

## 2020-11-18 ENCOUNTER — Encounter: Payer: Medicare HMO | Admitting: Nurse Practitioner

## 2020-11-22 NOTE — Progress Notes (Incomplete)
Patient Care Team: Coral Spikes, DO as PCP - General (Family Medicine) Danie Binder, MD (Inactive) as Consulting Physician (Gastroenterology) Mauro Kaufmann, RN as Oncology Nurse Navigator Rockwell Germany, RN as Oncology Nurse Navigator  DIAGNOSIS: No diagnosis found.  SUMMARY OF ONCOLOGIC HISTORY: Oncology History  Malignant neoplasm of upper-outer quadrant of right breast in female, estrogen receptor positive ([place])  10/01/2019 Initial Diagnosis   Screening mammogram showed possible right breast masses. Diagnostic mammogram and US showed a 1.5cm mass at the 12 o'clock position, a 0.7cm mass in the outer right breast, a shadowing benign calcification at the 1 o'clock position, and a cluster of cysts at the 9 o'clock position, with no right axillary adenopathy. Right breast biopsy of the 12:00 mass showed IDC, grade 1, HER-2 negative (1+), ER+ 90%, PR+ 95%, Ki67 15%. Biopsy of the outer right breast mass showed fibroadenoma and ALH.   10/21/2019 Cancer Staging   Staging form: Breast, AJCC 8th Edition - Clinical stage from 10/21/2019: Stage IA (cT1c, cN0, cM0, G1, ER+, PR+, HER2-) - Signed by Nicholas Lose, MD on 10/21/2019    11/24/2019 Surgery   Right lumpectomy (Tsuei): IDC, 0.9cm, grade 2, involved superior margin, 5 right axillary lymph nodes negative for carcinoma.   01/11/2020 Oncotype testing   Score: 14 (4% ROR)   01/29/2020 -  Radiation Therapy   Adjuvant radiation     CHIEF COMPLIANT: Follow-up of right breast cancer  INTERVAL HISTORY: Shannon Price is a 60 y.o. with above-mentioned history of right breast cancer having undergone right lumpectomy. She presents to the clinic today for follow-up.   ALLERGIES:  has No Known Allergies.  MEDICATIONS:  Current Outpatient Medications  Medication Sig Dispense Refill   acetaminophen (TYLENOL) 500 MG tablet Take 1,000 mg by mouth every 6 (six) hours as needed for mild pain or moderate pain.      aspirin 325 MG tablet Take  1 tablet (325 mg total) by mouth daily. 30 tablet 5   atorvastatin (LIPITOR) 80 MG tablet Take 1 tablet (80 mg total) by mouth daily. 90 tablet 3   clonazePAM (KLONOPIN) 0.5 MG tablet Take one qhs prn sleep (Patient taking differently: Take 0.5 mg by mouth at bedtime as needed (sleep).) 30 tablet 5   escitalopram (LEXAPRO) 20 MG tablet TAKE 1 TABLET EVERY DAY 90 tablet 1   lisinopril-hydrochlorothiazide (ZESTORETIC) 20-12.5 MG tablet Take 1 tablet by mouth daily. 90 tablet 0   metFORMIN (GLUCOPHAGE) 1000 MG tablet Take 1 tablet (1,000 mg total) by mouth 2 (two) times daily. 180 tablet 0   verapamil (CALAN-SR) 240 MG CR tablet Take 1 tablet (240 mg total) by mouth daily. 90 tablet 1   No current facility-administered medications for this visit.    PHYSICAL EXAMINATION: ECOG PERFORMANCE STATUS: {CHL ONC ECOG [phone]}  There were no vitals filed for this visit. There were no vitals filed for this visit.  BREAST:*** No palpable masses or nodules in either right or left breasts. No palpable axillary supraclavicular or infraclavicular adenopathy no breast tenderness or nipple discharge. (exam performed in the presence of a chaperone)  LABORATORY DATA:  I have reviewed the data as listed CMP Latest Ref Rng & Units 08/01/2020 12/15/2019 10/27/2019  Glucose 65 - 99 mg/dL 149(H) 180(H) 145(H)  BUN 6 - 24 mg/dL '9 8 7  ' Creatinine 0.57 - 1.00 mg/dL 0.81 0.84 0.79  Sodium 134 - 144 mmol/L 130(L) 132(L) 134(L)  Potassium 3.5 - 5.2 mmol/L 5.4(H) 4.9 4.5  Chloride 96 - 106 mmol/L 93(L) 97(L) 98  CO2 20 - 29 mmol/L '22 24 26  ' Calcium 8.7 - 10.2 mg/dL 9.3 9.4 9.0  Total Protein 6.0 - 8.5 g/dL 6.6 - -  Total Bilirubin 0.0 - 1.2 mg/dL 0.4 - -  Alkaline Phos 44 - 121 IU/L 96 - -  AST 0 - 40 IU/L 30 - -  ALT 0 - 32 IU/L 36(H) - -    Lab Results  Component Value Date   WBC 7.4 08/01/2020   HGB 13.9 08/01/2020   HCT 40.6 08/01/2020   MCV 95 08/01/2020   PLT 307 08/01/2020   NEUTROABS 8.9 (H)  03/09/2018    ASSESSMENT & PLAN:  No problem-specific Assessment & Plan notes found for this encounter.    No orders of the defined types were placed in this encounter.  The patient has a good understanding of the overall plan. she agrees with it. she will call with any problems that may develop before the next visit here.  Total time spent: *** mins including face to face time and time spent for planning, charting and coordination of care  Rulon Eisenmenger, MD, MPH 11/22/2020  I, Thana Ates, am acting as scribe for Dr. Nicholas Lose.  {insert scribe attestation}

## 2020-11-23 ENCOUNTER — Inpatient Hospital Stay: Payer: Medicare HMO | Admitting: Hematology and Oncology

## 2020-11-23 NOTE — Assessment & Plan Note (Deleted)
11/24/19: Right lumpectomy (Tsuei): IDC, 0.9cm, grade 2, involved superior margin, 5 right axillary lymph nodes negative for carcinoma. HER-2 negative (1+), ER+ 90%, PR+ 95%, Ki67 15%. 12/15/2018: Margin resection: No cancer Oncotype DX score 14: Distant recurrence at 9 years: 4% Adjuvant radiation: 01/29/2020-02/25/2020  Current treatment: Antiestrogen therapy with letrozole once daily x5 to 7 years Letrozole counseling: We discussed the risks and benefits of anti-estrogen therapy with aromatase inhibitors. These include but not limited to insomnia, hot flashes, mood changes, vaginal dryness, bone density loss, and weight gain. We strongly believe that the benefits far outweigh the risks. Patient understands these risks and consented to starting treatment. Planned treatment duration is 5-7 years.  Return to clinic in 3 months for survivorship care plan visit

## 2020-11-29 ENCOUNTER — Other Ambulatory Visit: Payer: Self-pay | Admitting: Family Medicine

## 2021-01-09 NOTE — Progress Notes (Signed)
Patient Care Team: Coral Spikes, DO as PCP - General (Family Medicine) Danie Binder, MD (Inactive) as Consulting Physician (Gastroenterology) Mauro Kaufmann, RN as Oncology Nurse Navigator Rockwell Germany, RN as Oncology Nurse Navigator  DIAGNOSIS:    ICD-10-CM   1. Malignant neoplasm of upper-outer quadrant of right breast in female, estrogen receptor positive ([place])  C50.411 MM DIAG BREAST TOMO BILATERAL   Z17.0       SUMMARY OF ONCOLOGIC HISTORY: Oncology History  Malignant neoplasm of upper-outer quadrant of right breast in female, estrogen receptor positive ([place])  10/01/2019 Initial Diagnosis   Screening mammogram showed possible right breast masses. Diagnostic mammogram and US showed a 1.5cm mass at the 12 o'clock position, a 0.7cm mass in the outer right breast, a shadowing benign calcification at the 1 o'clock position, and a cluster of cysts at the 9 o'clock position, with no right axillary adenopathy. Right breast biopsy of the 12:00 mass showed IDC, grade 1, HER-2 negative (1+), ER+ 90%, PR+ 95%, Ki67 15%. Biopsy of the outer right breast mass showed fibroadenoma and ALH.   10/21/2019 Cancer Staging   Staging form: Breast, AJCC 8th Edition - Clinical stage from 10/21/2019: Stage IA (cT1c, cN0, cM0, G1, ER+, PR+, HER2-) - Signed by Nicholas Lose, MD on 10/21/2019    11/24/2019 Surgery   Right lumpectomy (Ts[facility]i): IDC, 0.9cm, grade 2, involved superior margin, 5 right axillary lymph nodes negative for carcinoma.   01/11/2020 Oncotype testing   Score: 14 (4% ROR)   01/29/2020 - 02/25/2020 Radiation Therapy   Adjuvant radiation     CHIEF COMPLIANT: Follow-up of right breast cancer  INTERVAL HISTORY: Shannon Price is a 61 y.o. with above-mentioned history of right breast cancer having undergone lumpectomy. She presents to the clinic today for follow-up.  She is here accompanied by her husband and reports that the main complaint today is right breast pain.  She has not  started antiestrogen therapy.  She and her husband are retired [employer].  Recently he hurt his back and therefore he is not able to work at this time.  ALLERGIES:  has No Known Allergies.  MEDICATIONS:  Current Outpatient Medications  Medication Sig Dispense Refill   letrozole (FEMARA) 2.5 MG tablet Take 1 tablet (2.5 mg total) by mouth daily. 90 tablet 3   acetaminophen (TYLENOL) 500 MG tablet Take 1,000 mg by mouth every 6 (six) hours as needed for mild pain or moderate pain.      aspirin 325 MG tablet Take 1 tablet (325 mg total) by mouth daily. 30 tablet 5   atorvastatin (LIPITOR) 80 MG tablet Take 1 tablet (80 mg total) by mouth daily. 90 tablet 3   clonazePAM (KLONOPIN) 0.5 MG tablet Take one qhs prn sleep (Patient taking differently: Take 0.5 mg by mouth at bedtime as needed (sleep).) 30 tablet 5   escitalopram (LEXAPRO) 20 MG tablet TAKE 1 TABLET EVERY DAY 90 tablet 1   lisinopril-hydrochlorothiazide (ZESTORETIC) 20-12.5 MG tablet Take 1 tablet by mouth once daily 90 tablet 0   metFORMIN (GLUCOPHAGE) 1000 MG tablet Take 1 tablet (1,000 mg total) by mouth 2 (two) times daily. 180 tablet 0   verapamil (CALAN-SR) 240 MG CR tablet Take 1 tablet (240 mg total) by mouth daily. 90 tablet 1   No current facility-administered medications for this visit.    PHYSICAL EXAMINATION: ECOG PERFORMANCE STATUS: 1 - Symptomatic but completely ambulatory  Vitals:   01/10/21 1511  BP: (!) 173/81  Pulse:  99  Resp: 16  Temp: 97.6 F (36.4 C)  SpO2: 100%   Filed Weights   01/10/21 1511  Weight: 259 lb 6.4 oz (117.7 kg)      LABORATORY DATA:  I have reviewed the data as listed CMP Latest Ref Rng & Units 08/01/2020 12/15/2019 10/27/2019  Glucose 65 - 99 mg/dL 149(H) 180(H) 145(H)  BUN 6 - 24 mg/dL _0 Creatinine 0.57 - 1.00 mg/dL 0.81 0.84 0.79  Sodium 134 - 144 mmol/L 130(L) 132(L) 134(L)  Potassium 3.5 - 5.2 mmol/L 5.4(H) 4.9 4.5  Chloride 96 - 106 mmol/L 93(L) 97(L) 98  CO2 20  - 29 mmol/L _1 Calcium 8.7 - 10.2 mg/dL 9.3 9.4 9.0  Total Protein 6.0 - 8.5 g/dL 6.6 - -  Total Bilirubin 0.0 - 1.2 mg/dL 0.4 - -  Alkaline Phos 44 - 121 IU/L 96 - -  AST 0 - 40 IU/L 30 - -  ALT 0 - 32 IU/L 36(H) - -    Lab Results  Component Value Date   WBC 7.4 08/01/2020   HGB 13.9 08/01/2020   HCT 40.6 08/01/2020   MCV 95 08/01/2020   PLT 307 08/01/2020   NEUTROABS 8.9 (H) 03/09/2018    ASSESSMENT & PLAN:  Malignant neoplasm of upper-outer quadrant of right breast in female, estrogen receptor positive ([place]) 11/24/19: Right lumpectomy ([facility]): IDC, 0.9cm, grade 2, involved superior margin, 5 right axillary lymph nodes negative for carcinoma. HER-2 negative (1+), ER+ 90%, PR+ 95%, Ki67 15%. Resection of the positive margins was conducted Oncotype DX: Low risk Adjuvant radiation completed  Treatment Plan: Adjuvant antiestrogen therapy: With letrozole 2.5 mg daily x5 years  Letrozole counseling: We discussed the risks and benefits of anti-estrogen therapy with aromatase inhibitors. These include but not limited to insomnia, hot flashes, mood changes, vaginal dryness, bone density loss, and weight gain. We strongly believe that the benefits far outweigh the risks. Patient understands these risks and consented to starting treatment. Planned treatment duration is 5 years.  Breast pain: I discussed with her several remedies including CBD oil, Voltaren gel etc. RTC in 3 months for telephone visit to assess tolerance to letrozole.      Orders Placed This Encounter  Procedures   MM DIAG BREAST TOMO BILATERAL    Standing Status:   Future    Standing Expiration Date:   01/10/2022    Order Specific Question:   Reason for Exam (SYMPTOM  OR DIAGNOSIS REQUIRED)    Answer:   Annual mammograms with H/O breast cancer    Order Specific Question:   Is the patient pregnant?    Answer:   No    Order Specific Question:   Preferred imaging location?    Answer:   [facility]     Order Specific Question:   Release to patient    Answer:   Immediate   The patient has a good understanding of the overall plan. she agrees with it. she will call with any problems that may develop before the next visit here.  Total time spent: 30 mins including face to face time and time spent for planning, charting and coordination of care  Rulon Eisenmenger, MD, MPH 01/10/2021  I, Thana Ates, am acting as scribe for Dr. Nicholas Lose.  I have reviewed the above documentation for accuracy and completeness, and I agree with the above.

## 2021-01-10 ENCOUNTER — Inpatient Hospital Stay: Payer: Medicare HMO | Attending: Hematology and Oncology | Admitting: Hematology and Oncology

## 2021-01-10 ENCOUNTER — Other Ambulatory Visit: Payer: Self-pay

## 2021-01-10 VITALS — BP 173/81 | HR 99 | Temp 97.6°F | Resp 16 | Ht 65.0 in | Wt 259.4 lb

## 2021-01-10 DIAGNOSIS — C50411 Malignant neoplasm of upper-outer quadrant of right female breast: Secondary | ICD-10-CM | POA: Diagnosis not present

## 2021-01-10 DIAGNOSIS — N644 Mastodynia: Secondary | ICD-10-CM | POA: Insufficient documentation

## 2021-01-10 DIAGNOSIS — Z170 Estrogen receptor positive status [ER+]: Secondary | ICD-10-CM | POA: Diagnosis not present

## 2021-01-10 MED ORDER — LETROZOLE 2.5 MG PO TABS: 2.5000 mg | ORAL_TABLET | Freq: Every day | ORAL | 3 refills | Status: AC

## 2021-01-10 NOTE — Assessment & Plan Note (Signed)
11/24/19: Right lumpectomy (Ts[facility]i): IDC, 0.9cm, grade 2, involved superior margin, 5 right axillary lymph nodes negative for carcinoma. HER-2 negative (1+), ER+ 90%, PR+ 95%, Ki67 15%. Resection of the positive margins was conducted Oncotype DX: Low risk Adjuvant radiation completed  Treatment Plan: Adjuvant antiestrogen therapy: With letrozole 2.5 mg daily x5 years  Letrozole counseling: We discussed the risks and benefits of anti-estrogen therapy with aromatase inhibitors. These include but not limited to insomnia, hot flashes, mood changes, vaginal dryness, bone density loss, and weight gain. We strongly believe that the benefits far outweigh the risks. Patient understands these risks and consented to starting treatment. Planned treatment duration is 5 years.  Breast pain: I discussed with her several remedies including CBD oil, Voltaren gel etc. RTC in 3 months for telephone visit to assess tolerance to letrozole.

## 2021-01-25 ENCOUNTER — Other Ambulatory Visit: Payer: Self-pay | Admitting: Internal Medicine

## 2021-01-25 ENCOUNTER — Other Ambulatory Visit: Payer: Self-pay | Admitting: Hematology and Oncology

## 2021-01-25 ENCOUNTER — Other Ambulatory Visit: Payer: Self-pay | Admitting: Family Medicine

## 2021-01-25 DIAGNOSIS — F321 Major depressive disorder, single episode, moderate: Secondary | ICD-10-CM

## 2021-01-25 DIAGNOSIS — E785 Hyperlipidemia, unspecified: Secondary | ICD-10-CM

## 2021-01-25 MED ORDER — VERAPAMIL HCL ER 240 MG PO TBCR: 240.0000 mg | EXTENDED_RELEASE_TABLET | Freq: Every day | ORAL | 3 refills | Status: AC

## 2021-01-25 MED ORDER — ESCITALOPRAM OXALATE 20 MG PO TABS: 20.0000 mg | ORAL_TABLET | Freq: Every day | ORAL | 3 refills | Status: AC

## 2021-01-25 MED ORDER — ATORVASTATIN CALCIUM 80 MG PO TABS: 80.0000 mg | ORAL_TABLET | Freq: Every day | ORAL | 3 refills | Status: AC

## 2021-01-30 ENCOUNTER — Other Ambulatory Visit: Payer: Self-pay

## 2021-01-30 MED ORDER — METFORMIN HCL 1000 MG PO TABS: 1000.0000 mg | ORAL_TABLET | Freq: Two times a day (BID) | ORAL | 0 refills | Status: DC

## 2021-01-30 MED ORDER — LISINOPRIL-HYDROCHLOROTHIAZIDE 20-12.5 MG PO TABS: 1.0000 | ORAL_TABLET | Freq: Every day | ORAL | 0 refills | Status: DC

## 2021-02-16 ENCOUNTER — Ambulatory Visit
Admission: RE | Admit: 2021-02-16 | Discharge: 2021-02-16 | Disposition: A | Discharge status: Home / Self Care | Payer: Medicare HMO | Source: Ambulatory Visit | Attending: Hematology and Oncology | Admitting: Hematology and Oncology

## 2021-02-16 DIAGNOSIS — Z170 Estrogen receptor positive status [ER+]: Secondary | ICD-10-CM

## 2021-02-16 DIAGNOSIS — C50411 Malignant neoplasm of upper-outer quadrant of right female breast: Secondary | ICD-10-CM

## 2021-02-16 DIAGNOSIS — R922 Inconclusive mammogram: Secondary | ICD-10-CM | POA: Diagnosis not present

## 2021-02-21 ENCOUNTER — Encounter (HOSPITAL_COMMUNITY): Payer: Self-pay

## 2021-04-07 NOTE — Progress Notes (Signed)
I called the patient's several times. ?No response and there is no way to leave a voicemail ? ?This encounter was created in error - please disregard. ?

## 2021-04-10 ENCOUNTER — Inpatient Hospital Stay: Payer: Medicare HMO | Attending: Hematology and Oncology | Admitting: Hematology and Oncology

## 2021-04-10 DIAGNOSIS — Z170 Estrogen receptor positive status [ER+]: Secondary | ICD-10-CM

## 2021-04-10 NOTE — Assessment & Plan Note (Signed)
11/24/19: Right lumpectomy ([facility]i): IDC, 0.9cm, grade 2, involved superior margin, 5 right axillary lymph nodes negative for carcinoma. HER-2 negative (1+), ER+ 90%, PR+ 95%, Ki67 15%. ?Resection of the positive margins was conducted ?Oncotype DX: Low risk ?Adjuvant radiation completed ?? ?Treatment Plan: Adjuvant antiestrogen therapy: With letrozole 2.5 mg daily x5 years started 01/10/21 ?? ?Letrozole Toxicities: ? ?Breast pain: I discussed with her several remedies including CBD oil, Voltaren gel etc. ?RTC in 1 year for follow up ?

## 2021-04-21 ENCOUNTER — Telehealth: Payer: Self-pay | Admitting: Family Medicine

## 2021-04-21 NOTE — Telephone Encounter (Signed)
Pt called in and has broken her foot. Foot broken on Sunday. Pt went to the ER and the orthopedist that they referred her to does not take her insurance. Pt has moved to [place]. Upon speaking to PCP, PCP states to go to Urgent Care or call hospital to have them refer her to someone else. Pt did give name of ortho in [place]; [facility] in [place], [place].  ?Pt contacted and verbalized understanding.  ?

## 2021-10-11 ENCOUNTER — Other Ambulatory Visit: Payer: Self-pay | Admitting: Family Medicine

## 2021-10-27 IMAGING — DX MM BREAST SURGICAL SPECIMEN
1 series · 2 of 2 positions shown · non-contrast
Comparison: Previous exam(s).

CLINICAL DATA: Biopsy-proven invasive ductal carcinoma involving
the UPPER OUTER QUADRANT of the RIGHT breast (ribbon clip) and
biopsy-proven high risk lobular neoplasia involving the OUTER RIGHT
breast (X clip). Radioactive seed localization of both lesions was
performed yesterday in anticipation of today's lumpectomy and
excisional biopsy.

EXAM:
SPECIMEN RADIOGRAPH OF THE RIGHT BREAST

[Series 1: specimen digital x-ray · right · 0.07mm/px · 2 of 2 slices shown]
[im 1/2]
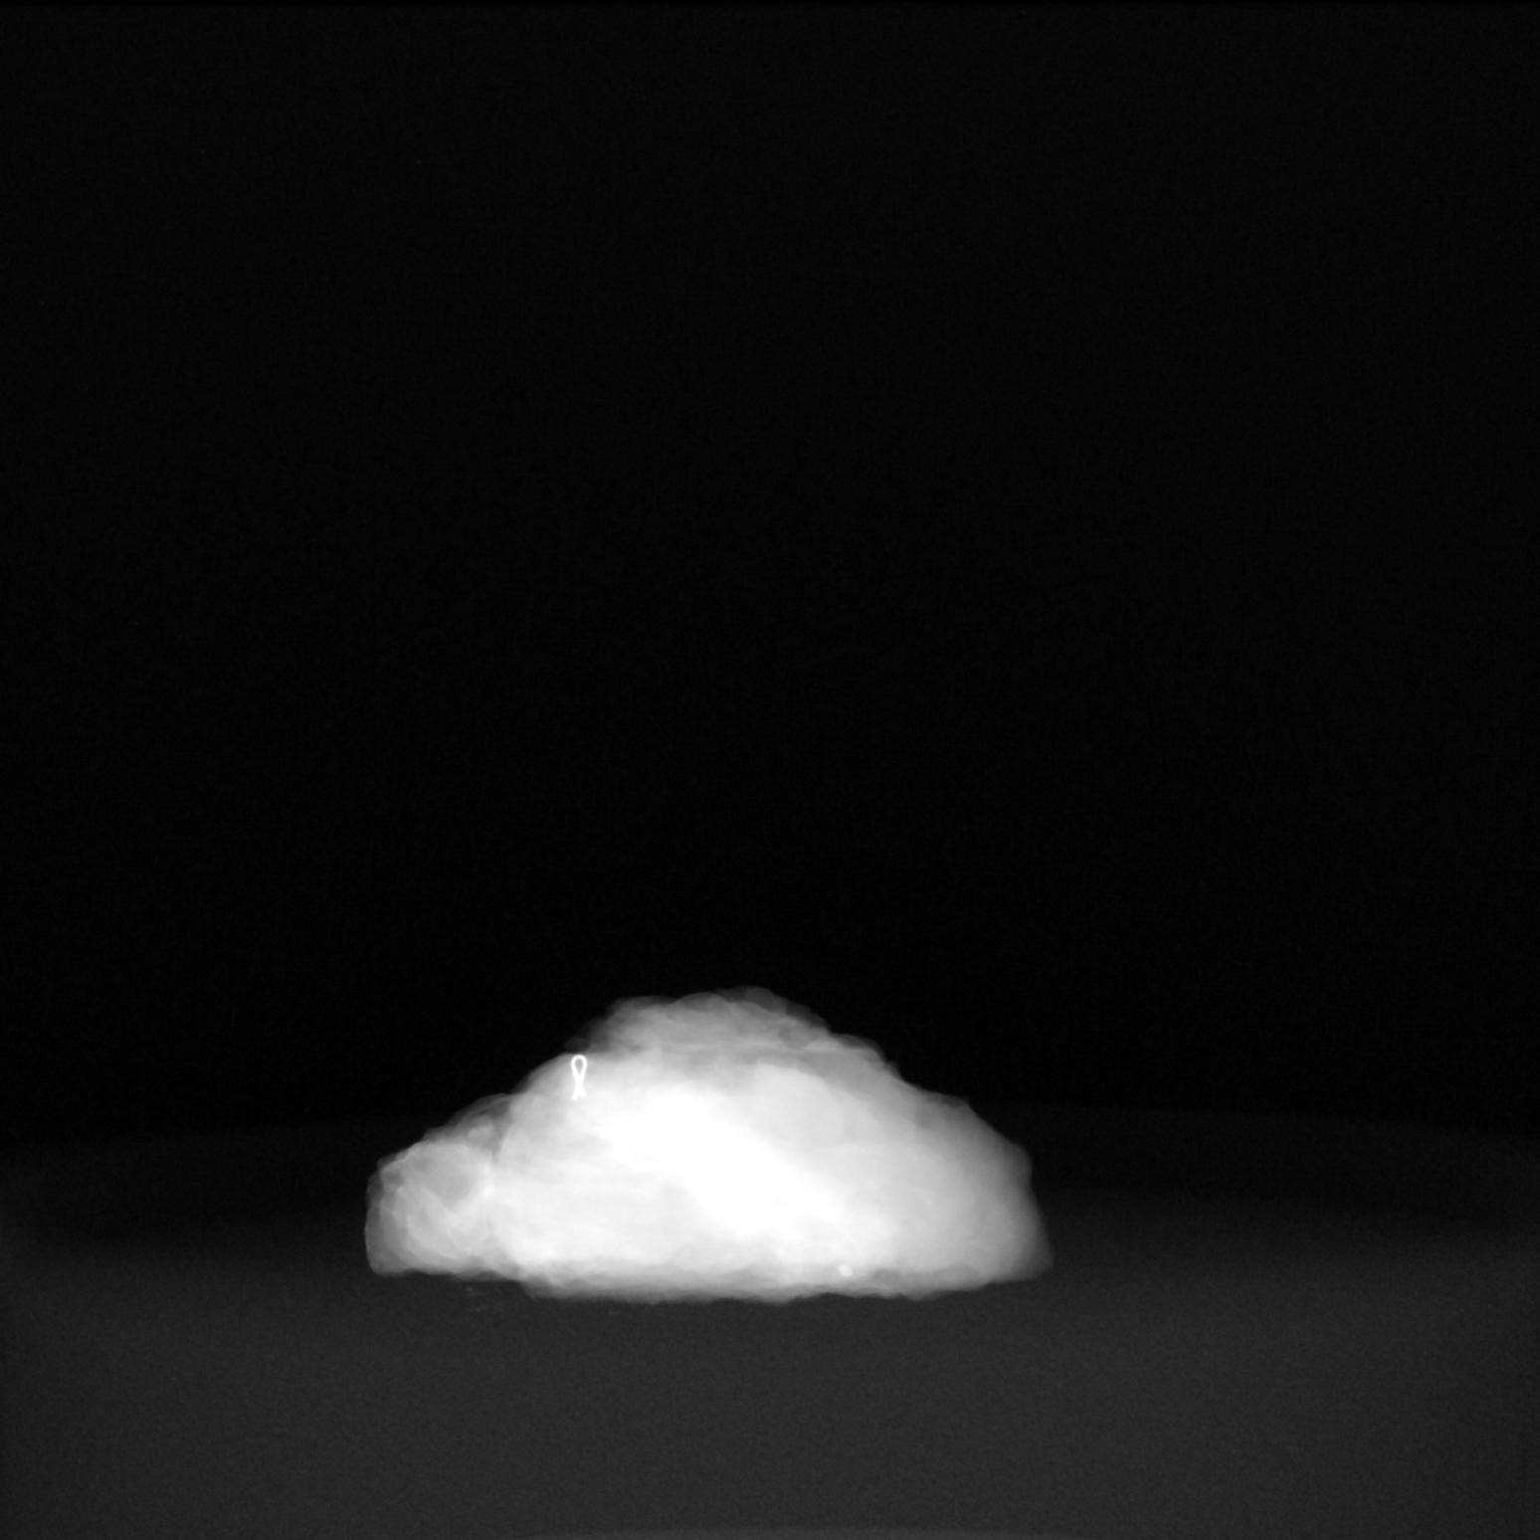
[im 2/2]
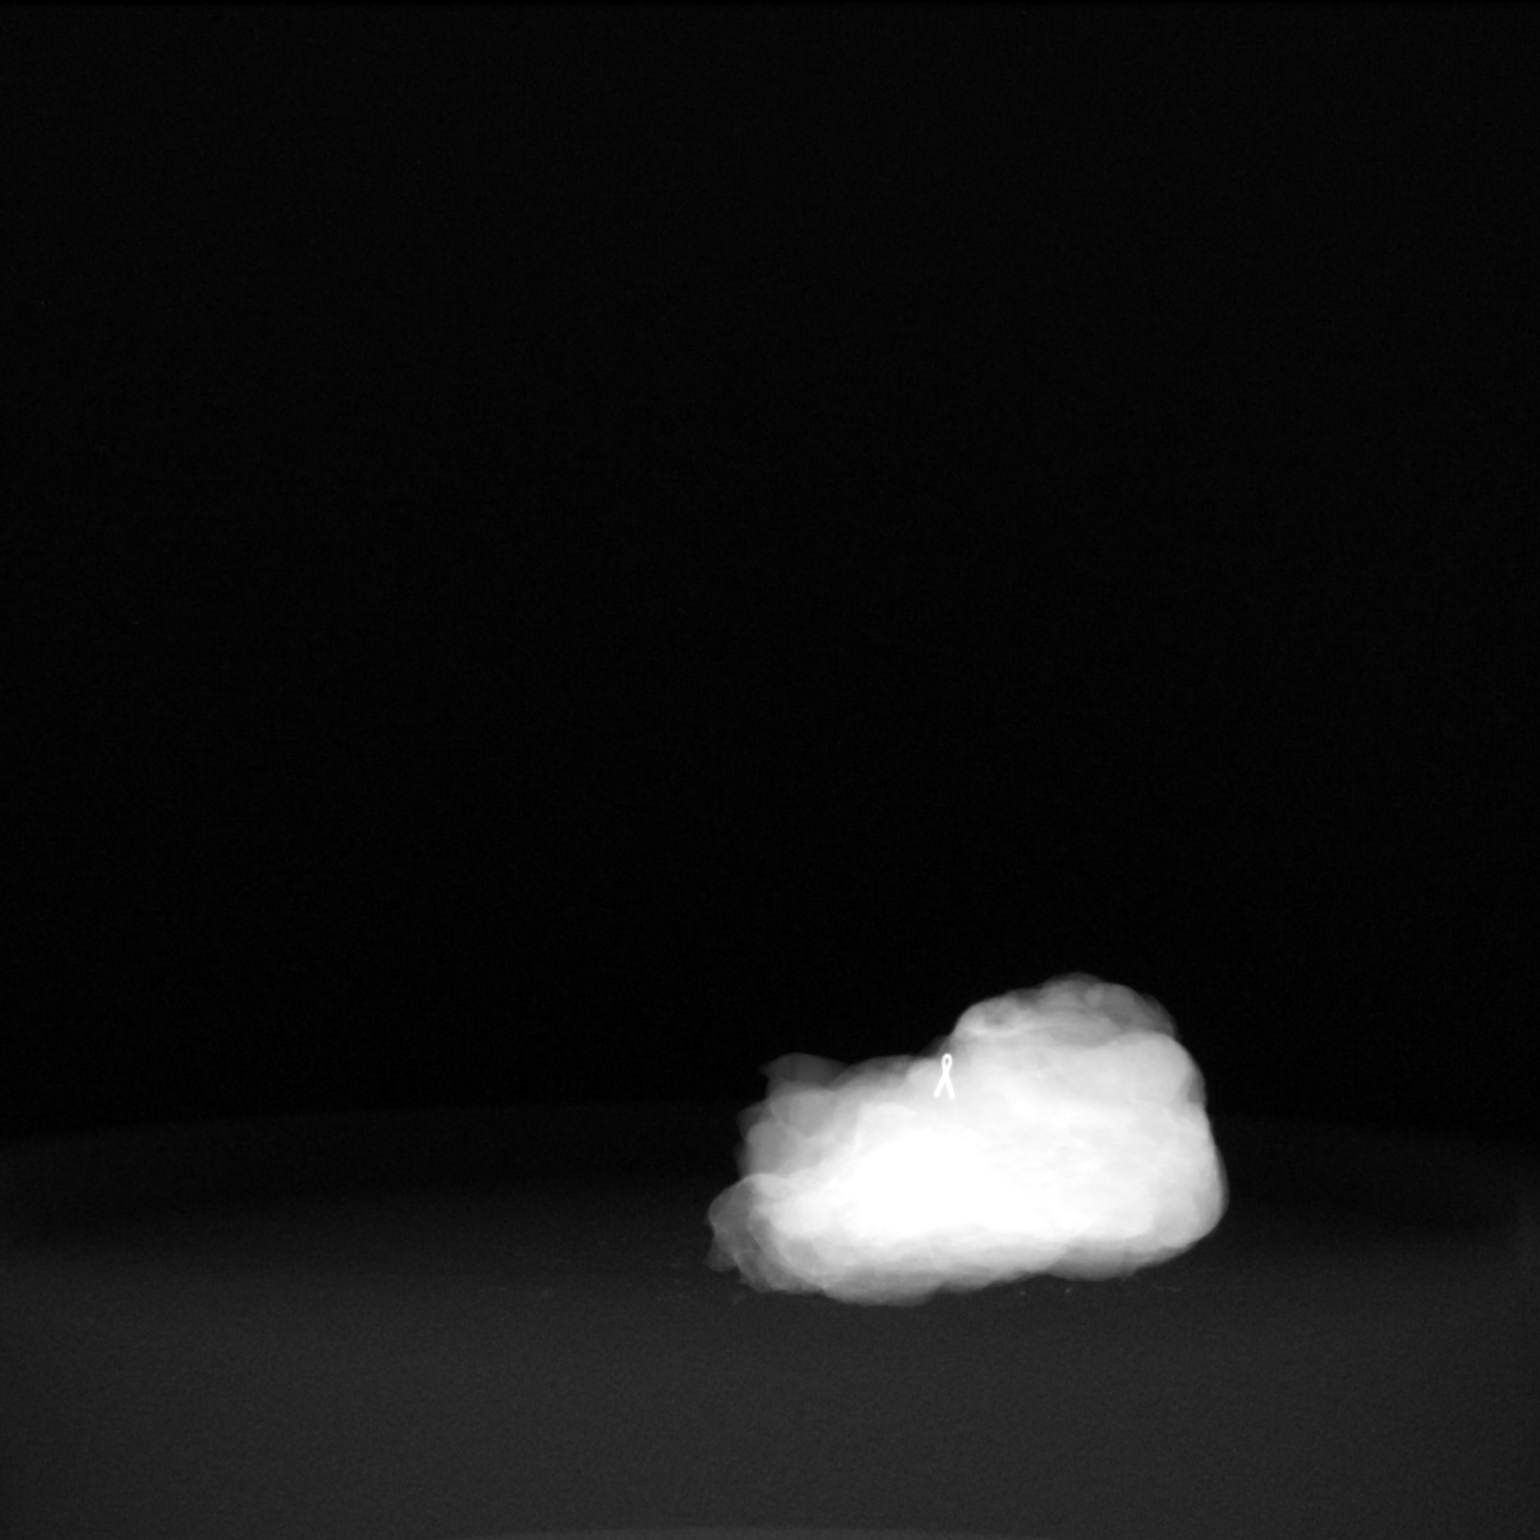

[2 of 2 positions shown; findings below may reference images not displayed]

FINDINGS: Status post excision of the RIGHT breast. The radioactive seed and
the ribbon shaped tissue marker clip are present in the
non-compressed specimen. The radioactive seed is intact. The ribbon
shaped clip is near the anterior margin of the non-compressed
specimen. This was discussed with Dr. Fdp by telephone at the time
of interpretation on 11/24/2019 at 2 o'clock p.m.
IMPRESSION: Specimen radiograph of the RIGHT breast.

## 2021-10-27 IMAGING — DX MM BREAST SURGICAL SPECIMEN
1 series · 2 of 2 positions shown · non-contrast
Comparison: Previous exam(s).

CLINICAL DATA: Biopsy-proven invasive ductal carcinoma involving
the UPPER OUTER QUADRANT of the RIGHT breast (ribbon clip) and
biopsy-proven high risk lobular neoplasia involving the OUTER RIGHT
breast (X clip). Radioactive seed localization of both lesions was
performed yesterday in anticipation of today's lumpectomy and
excisional biopsy.

EXAM:
SPECIMEN RADIOGRAPH OF THE RIGHT BREAST

[Series 1: specimen digital x-ray · right · 0.07mm/px · 2 of 2 slices shown]
[im 1/2]
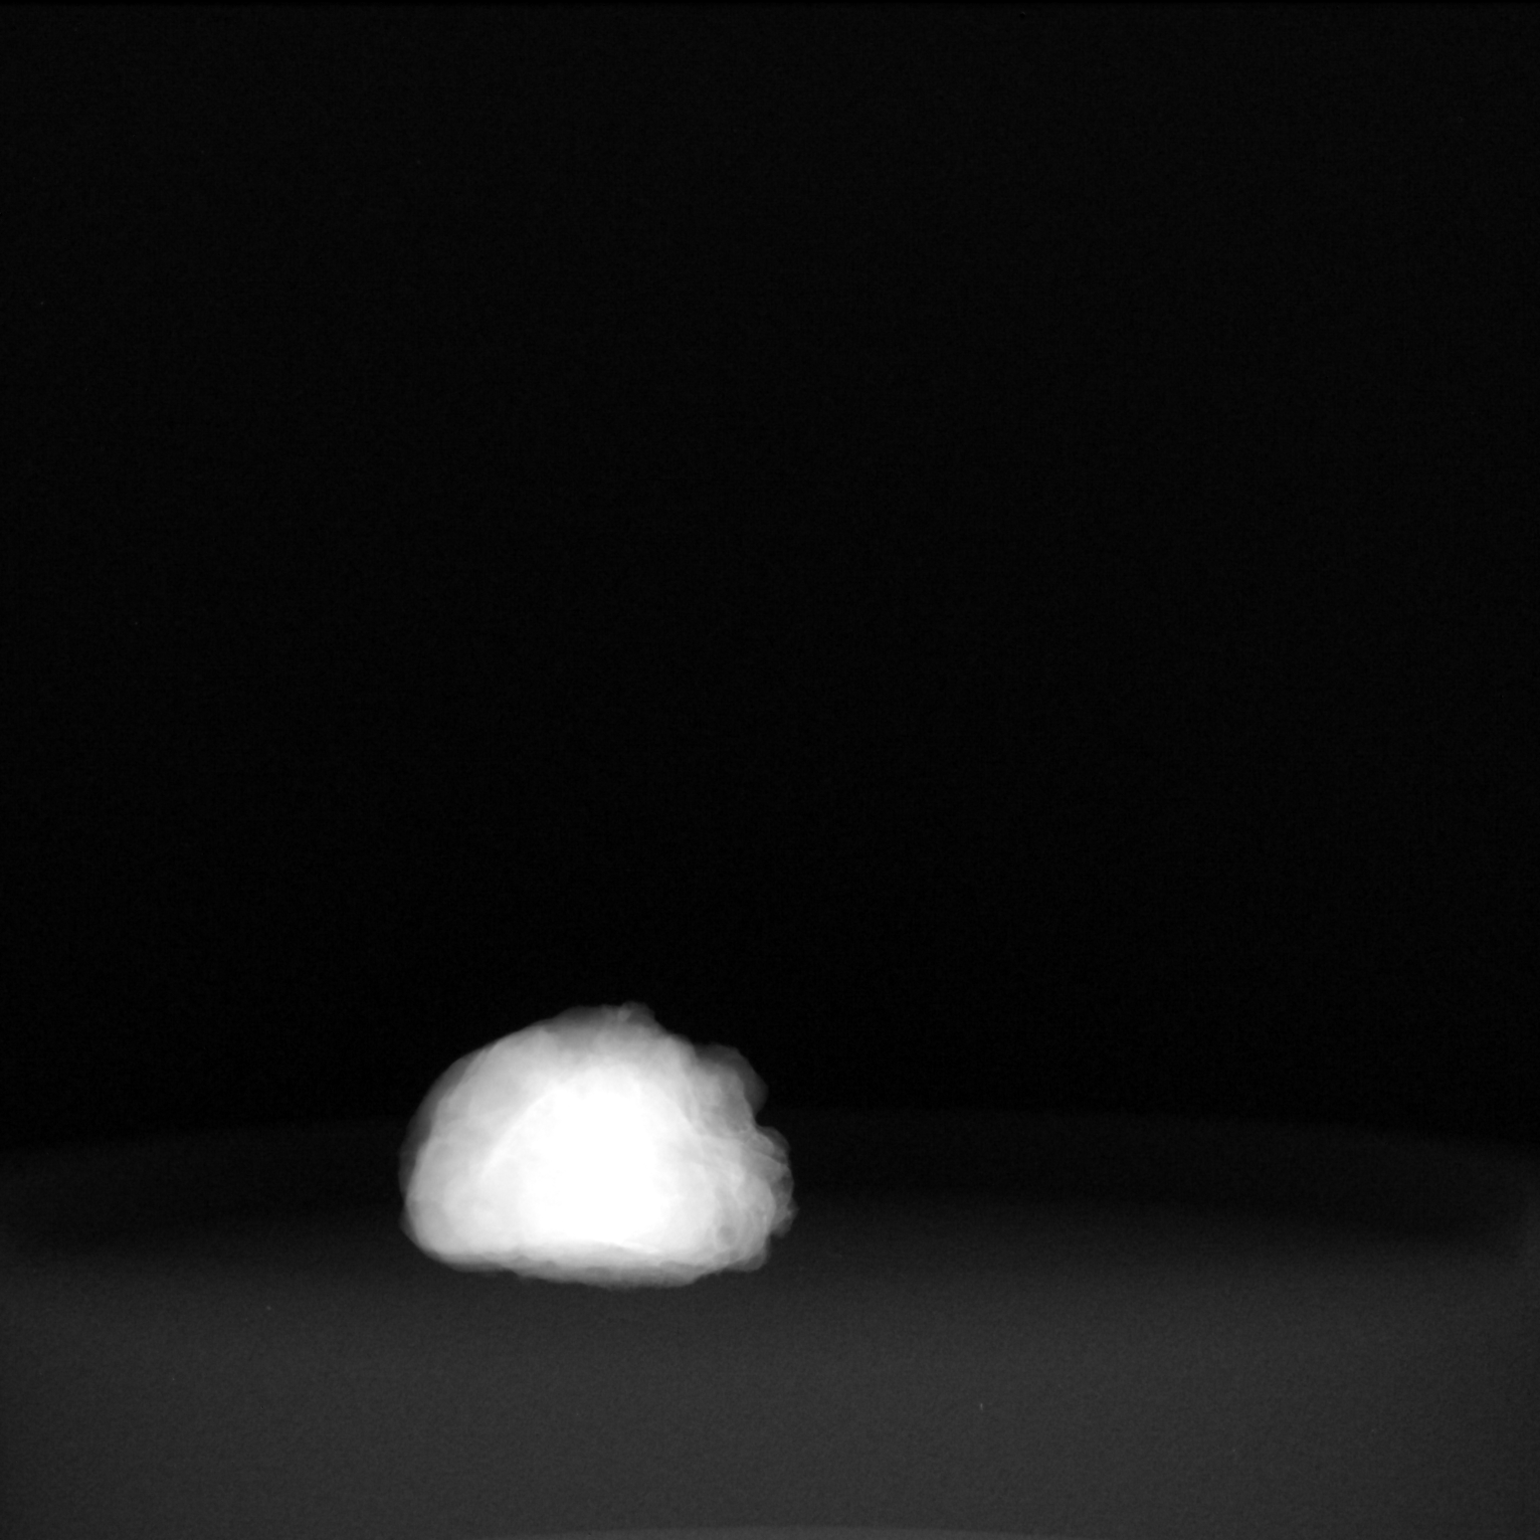
[im 2/2]
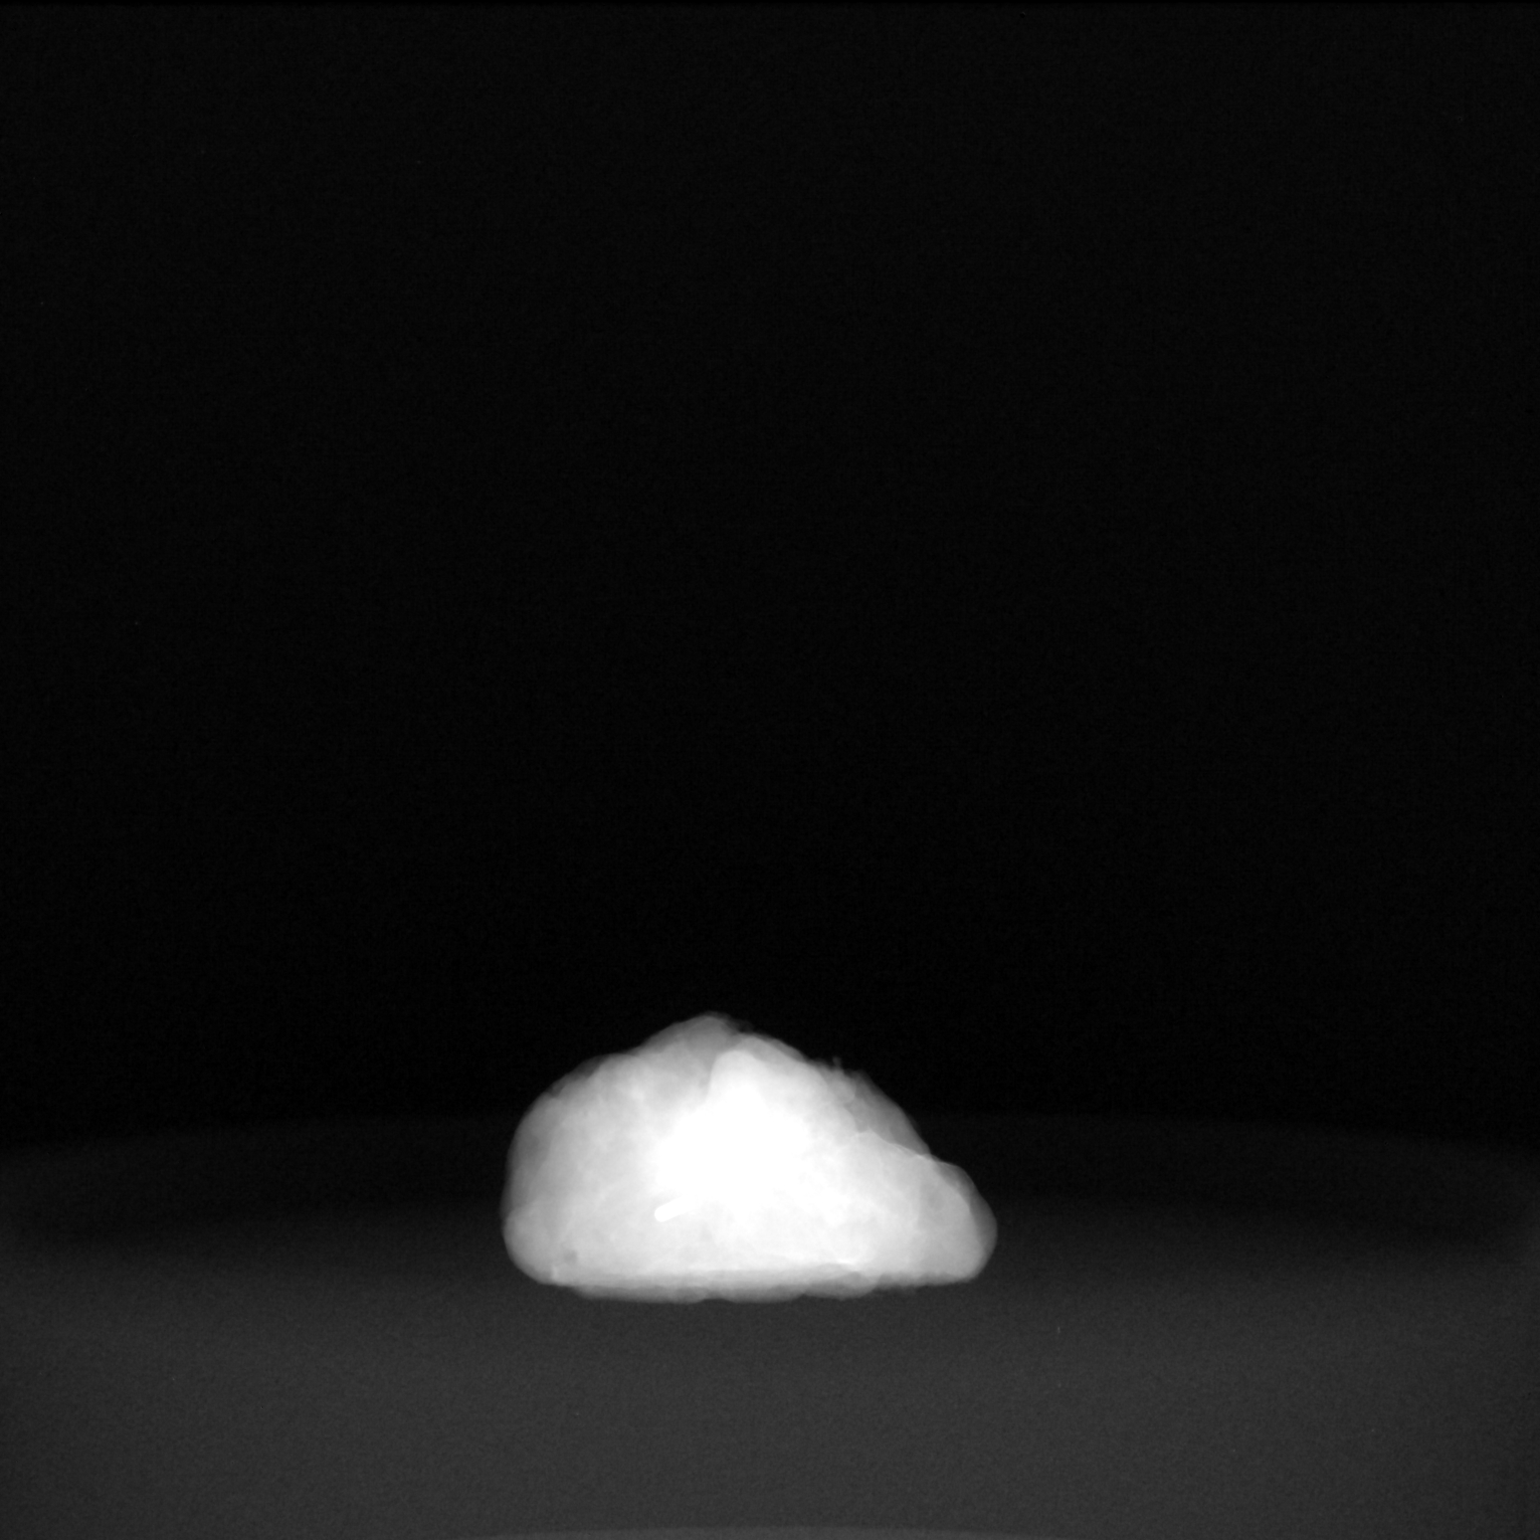

[2 of 2 positions shown; findings below may reference images not displayed]

FINDINGS: Status post excision of the RIGHT breast. The radioactive seed and
the X shaped tissue marker clip are present in the non-compressed
specimen. The radioactive seed is intact. This was discussed
directly with the operating room nurse at the time of interpretation
on 11/24/2019 at 2 o'clock p.m.
IMPRESSION: Specimen radiograph of the RIGHT breast.
